# Patient Record
Sex: Female | Born: 1985 | Race: White | Hispanic: No | Smoking: Never smoker
Health system: Southern US, Community
[De-identification: ages and names within clinical notes are randomized; demographics above are authoritative.]

## PROBLEM LIST (undated history)

## (undated) DIAGNOSIS — Z982 Presence of cerebrospinal fluid drainage device: Secondary | ICD-10-CM

## (undated) HISTORY — PX: VENTRICULOPERITONEAL SHUNT: SHX204

## (undated) LAB — TYPE AND ANTIBODY SCREEN (NO BAND): Ext Antibodies: POSITIVE

## (undated) LAB — RPR - FOR TREPONEMA AB POSITIVE ONLY: Ext Syphilis: NONREACTIVE

## (undated) LAB — PLATELET COUNT: Ext Platelets: 173

## (undated) LAB — RUBELLA ANTIBODY, IGG: Ext Rubella: NON-IMMUNE/NOT IMMUNE

## (undated) LAB — HIV-1 AND HIV-2 ANTIBODIES: Ext HIV: NEGATIVE

## (undated) LAB — CHLAMYDIA AMPLIFIED DNA PROBE: Ext Chlamydia: NOT DETECTED

## (undated) LAB — HEPATITIS B SURFACE ANTIGEN: Ext Hepatitis B: NEGATIVE

## (undated) LAB — CBC WITHOUT DIFFERENTIAL: Ext Hematocrit: 41 (ref 36–46)

## (undated) LAB — GC DNA AMPLIFIED PROBE: Ext Gonorrhea: NOT DETECTED

## (undated) LAB — ABO/RH

---

## 2013-07-01 ENCOUNTER — Encounter: Attending: MD | Primary: MD

## 2013-07-03 ENCOUNTER — Ambulatory Visit: Admit: 2013-07-03 | Discharge: 2013-07-03 | Attending: MD | Primary: MD

## 2013-07-03 DIAGNOSIS — R300 Dysuria: Secondary | ICD-10-CM

## 2013-07-03 LAB — URINALYSIS WITH MICROSCOPIC, CULTURE IF INDICATED
Bacteria, UA: NONE SEEN [HPF]
Bilirubin, UR: NEGATIVE
Blood, urine: NEGATIVE
Epithelial, Other: 0 [HPF] (ref 0–2)
Glucose, UA: NEGATIVE
Ketones, urine: NEGATIVE
Leukocyte Esterase, urine: NEGATIVE
Nitrite, UA: NEGATIVE
Protein, urine: NEGATIVE
RBC, UA: <1 [HPF] (ref 0–3)
Specific Gravity, urine: 1.012 (ref 1.010–1.025)
Squamous Epithelial, UA: 2 [HPF] (ref 0–4)
Urobilinogen, UA: <2 mg/dl (ref 0.0–2.0)
WBC, UA: <1 [HPF] (ref 0–3)
pH, UA: 8 (ref 5.0–8.0)

## 2013-07-03 NOTE — Progress Notes (Signed)
SUBJECTIVE:  Rachel [name] is a 28 y.o. female with multiple complaints. She is here for IBIS risk modeling after her BRCA testing came back negative.  She has dysuria for the past few days and wants to be checked for UTI. She denies fever/chills/back pain.  She has a long standing history of irregular cycles and anovulation (FSH 5 and LH 8.4, normal glucose and insulin in 2012, normal TSH this past January).  She tried clomid be got sick/hormonal with it and is interesed in discussing options.    Past Medical History   Diagnosis Date   . 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 Dysfunctional gallbladder      Past Surgical History   Procedure Laterality Date   . Tonsillectomy & adenoidectomy; < age 57  10/1997   . Exploratory laparotomy  08/2005, 08/2007,03/2010     x3   . Cholecystectomy  10/07/2009   . Appendectomy  11/05/2011       ROS: Review of Systems  General: negative for chills, dizziness, fatigue, fever and night sweats   Ear/Nose/Throat: negative for bleeding gums, sinus trouble and sore throat  Respiratory: negative for dyspnea on exertion, cough and wheezing  Cardiovascular: negative for chest pain at rest, dizziness/lightheadedness, exertional chest pain or pressure and irregular heart beat  Gastrointestinal: negative for abdominal pain, heartburn, nausea and poor appetite  Genitourinary: + dysuria, frequency,  Negative for hematuria and hesitancy  Gyn: no abnormal bleeding (but her last cycle was very heavy when she took provera to induce it), no pelvic pain or discharge, no breast pain or new/enlarging lumps on self exam  Neuro: negative for syncope, tremor and weakness  Endocrine: negative for cold intolerance and heat intolerance          OBJECTIVE:  Filed Vitals:    07/03/13 1306   BP: 112/70     Body mass index is 33.95 kg/(m^2).     White female in NAD  Psych: well groomed, good eye contact, normal affect and mood    Counseling and review of IBIS model.    ASSESSMENT:  Rachel [name] is a 28  y.o. female with negative BRCA genetic testing but strong FH of early breast and ovarian cancers in each grandmother respectively. She had IBIS modeling done and her 10 year risk of breast cancer is the same as the general population at 0.3% but her lifelong risk is 17.1%.  Discussed we will adjust this risk model as she gets older and/or she has other family members who have breast or ovarian cancer. Her mother is having testing done for BRCA and we discussed that if her mother is +, this will actually lower her own personal risk since she is negative....  She will let me know. 20 minutes spent reviewing her history and the risk model.  Discussed that if her risk stays more than 15-20%, she would qualify for MRI testing rather than mammogram but since her short-term risk is low, she would not need to start testing until age 71 at this point.  If she has any family changes or she notes any lumps, she will need to let me know.    UA/with reflex to UC sent to lab. Will contact her with results    Discussed her irregular cycles and she should start BBT charting ([email or web address]). Day 3 lab repeat with insulin and glucose testing, if she wants SA testing done, I can order it for her husband. May do better with Femara if needed.  All questions answered.      Counseling and review of labs only.  No exam  30 minutes, of which more than half dedicated to counseling related to irregular cycles and BRCA/genetic implications of testing/IBIS risk modeling.

## 2013-07-04 NOTE — Telephone Encounter (Signed)
Pt stated understanding and wanted to know if you had any reason why the pain and pressure would be back. alw

## 2013-07-04 NOTE — Telephone Encounter (Signed)
PT CALLED TO GET THE URINE RESULTS FROM YESTERDAY.  SHE IS AT WORK [phone]

## 2013-07-04 NOTE — Telephone Encounter (Signed)
Please let her know that it was completely negative.  No signs of UTI.

## 2013-07-04 NOTE — Telephone Encounter (Signed)
It may be due to her menses/hormones.  She last took the provera in mid-March so this could be related to her cycle/PCOS....  Since the pain just restarted, I think it would be good to just watch it for awhile.     If she has not had her menses this month, she should consider taking the provera again x 10 days to get a cycle going which may make things better.....    Please ask her if she wants to set up an appointment to review her temperature chart and other concerns in about 4-6 weeks? (it would be good to have a month of time after her menses starts and she does at least 4 weeks of BBT charting)

## 2013-07-07 NOTE — Telephone Encounter (Signed)
Tried to call her at her work number.  No answer.

## 2013-07-07 NOTE — Telephone Encounter (Signed)
Patient is called.  She states she thinks she figured it out and she was drinking too much green tea.  She states that since she has stopped drinking it, her pain has decreased.  She is still having some pain but not like she was.  She had a period on the 11th and is going to start her temperature charts.  She will wait to make an appointment to discuss temp charts.

## 2013-07-08 NOTE — Telephone Encounter (Signed)
Okay 

## 2013-07-24 NOTE — Telephone Encounter (Signed)
Pt. Taking progesterone she is on day 2.  She said Dr. Dan Humphreys was thinking of putting her on a different medication.  Please call. jh

## 2013-07-24 NOTE — Telephone Encounter (Signed)
She would like to try femara.     Fasting labs day 3    F/u appt day 24-28 of cycle

## 2013-07-25 LAB — COMPREHENSIVE METABOLIC PANEL
ALT - Alanine Amino transferase: 15 U/L (ref 5–33)
AST - Aspartate Aminotransferase: 21 U/L (ref 5–32)
Albumin/Globulin Ratio: 1.4 (ref 1.2–2.2)
Albumin: 4.2 g/dl (ref 3.5–4.8)
Alkaline Phosphatase: 73 U/L (ref 40–140)
Anion Gap: 14.3 mmol/L (ref 12–20)
BUN/Creatinine Ratio: 19.4 (ref 12–20)
BUN: 13 mg/dl (ref 8.0–20.0)
Bilirubin, Total: 0.6 mg/dl (ref 0.1–1.7)
CO2 - Carbon Dioxide: 27 mmol/L (ref 22–32)
Calcium: 9 mg/dl (ref 8.6–10.6)
Chloride: 103.3 mmol/L (ref 101.0–111.0)
Creatinine: 0.67 mg/dl (ref 0.60–1.30)
GFR Estimate: >60
Glucose: 84 mg/dl (ref 74–106)
Osmolality Calc: 278.7 mOsm/kg — ABNORMAL LOW (ref 280–300)
Potassium: 4.61 mmol/L (ref 3.50–5.10)
Protein, Total: 7.2 g/dl (ref 6.1–7.9)
Sodium: 140 mmol/L (ref 135.0–145.0)

## 2013-07-25 LAB — LUTEINIZING HORMONE: LH: 15.7 m[IU]/mL

## 2013-07-25 LAB — FOLLICLE STIMULATING HORMONE: FSH: 5.9 m[IU]/mL

## 2013-07-25 LAB — TSH: TSH - Thyroid Stimulating Hormone: 2.48 u[IU]/mL (ref 0.34–5.60)

## 2013-07-25 LAB — INSULIN, TOTAL: Insulin: 8.9 uU/ml (ref 1.9–23.0)

## 2013-07-25 MED ORDER — letrozole (FEMARA) 2.5 mg tablet
2.5 | ORAL_TABLET | Freq: Every day | ORAL | Status: AC
Start: 2013-07-25 — End: 2013-07-29

## 2013-07-25 NOTE — Telephone Encounter (Signed)
Please let her know that her labs still show PCOS changes.  Her insulin level is rising compared to last time she had it done --- there is another medication which can help her to ovulate and help her body process sugars better (aka - help with wt loss possibly).    If she wants to come in to discuss starting Metformin, please make her an appt or we can discuss it at her femara follow up appt which should be around day 24-28 of her cycle.Marland KitchenMarland KitchenMarland Kitchen

## 2013-07-25 NOTE — Telephone Encounter (Signed)
Left VM for patient to return my call

## 2013-07-25 NOTE — Telephone Encounter (Signed)
Left VM for patient to return call

## 2013-07-26 NOTE — Telephone Encounter (Signed)
Pt sent a FB message to my phone since she could not call office back.    Rachel [name] called her back and left message that there is nothing needing immediate attention about her labs and she should go ahead with the femara.      She told her to call back Monday to discuss possible metformin in more detail.

## 2013-07-28 MED ORDER — metFORMIN (GLUCOPHAGE-XR) 500 MG 24 hr tablet
500 | ORAL_TABLET | ORAL | 2.00 refills | 30.00000 days | Status: DC
Start: 2013-07-28 — End: 2013-09-23

## 2013-07-28 NOTE — Telephone Encounter (Signed)
Metformin called in.  Please ask her to start with 1 po daily and after 2 weeks increase to 2 po daily (at same time is fine).      She may have GI upset and/or loose stools with this medication.  She needs to drink adequate water while on Metformin and should not take if dehydrated.    I will talk to her more about it at her upcoming visit.

## 2013-07-28 NOTE — Telephone Encounter (Signed)
Left pt a VM notifying her of Dr. Tilman Neat note. Advised to call if she has any questions.

## 2013-07-28 NOTE — Telephone Encounter (Signed)
Pt. Left a message.  She said that she is ok with being put on medication.  Her pharmacy is the [facility].  If you need to call she is at [phone] now. jh

## 2013-08-01 MED ORDER — ondansetron (ZOFRAN) 4 MG tablet
4 | ORAL_TABLET | Freq: Four times a day (QID) | ORAL | Status: AC | PRN
Start: 2013-08-01 — End: 2013-08-08

## 2013-08-01 NOTE — Telephone Encounter (Signed)
Patient is called back to let her know that an Rx was sent in for zofran.  Instructed to only use if nausea is really bad.  Instructed that she can also take the metformin every other day for a week to try to lessen the nausea.  Informed that Zofran can also make her constipated.  She also has a question about her progesterone.  She has started on the femara and wants to know if she should still use the progesterone.  She is on day 10 of her cycle.

## 2013-08-01 NOTE — Telephone Encounter (Signed)
Patient calls and states that she is nauseous from starting Metformin.  She states she didn't have nausea last time but does this time.  She states she had a zofran left over and took 1/2 of a 4mg  sl tab that helped.  She was wondering if she could get an rx for zofran until she gets used to medication.  She is taking it mid day and still on the 1 tab a day.  Will check with Dr. Dan Humphreys and will call her back.  To call back at [phone] and can leave a message.  Rx can be sent to [facility].

## 2013-08-01 NOTE — Telephone Encounter (Signed)
Left message that Dr. Dan Humphreys says she does not have to take the progesterone.  Also informed of appointment date and time for femara check.  Instructed to call if she has any questions.

## 2013-08-08 MED ORDER — HYDROcodone-acetaminophen (NORCO) 5-325 mg per tablet
5-325 | ORAL_TABLET | Freq: Three times a day (TID) | ORAL | 0.00 refills | 30.00000 days | Status: DC | PRN
Start: 2013-08-08 — End: 2013-11-20

## 2013-08-08 MED ORDER — ibuprofen (ADVIL,MOTRIN) 600 MG tablet
600 | ORAL_TABLET | Freq: Four times a day (QID) | ORAL | Status: AC | PRN
Start: 2013-08-08 — End: 2013-08-18

## 2013-08-08 NOTE — Telephone Encounter (Signed)
Patient calls and states she is having severe abdominal pain.  She states she started her Femara this month and she is on day 16 of her cycle.  She states that the pain started on Wednesday night and felt bloated also then but subsided.  Now pain is much worse and is her whole abdomen not just one side.  She feels like her abdomen is also bloated and swollen and feels tender to touch.  She states she took 800 mg of ibuprofen 2 hours ago but hasn't helped at all.  She has no bleeding and no fever at this time.  Will discuss with Dr. Dan Humphreys and will call her back.

## 2013-08-08 NOTE — Telephone Encounter (Signed)
Tried to call patient back after talking to Dr. Dan Humphreys. Message left that [place] is scheduled for 2:30 and will need a full bladder.

## 2013-08-08 NOTE — Telephone Encounter (Signed)
Called radiology who stated Rachel [name] was having left sided pain on exam (severe) and there was a 2.1 cm dominant follicle present.  Most likely Rachel [name] either is going to ovulate or just ovulated and the pain is from this.  Blood flow to the ovary was okay and no other issues seen.    This is the result of the femara hopefully working but I am concerned that the treament for Rachel [name] ovulation problem is causing Rachel [name] pain.    Norco script at desk .  No driving when using Norco.

## 2013-08-08 NOTE — Telephone Encounter (Signed)
Discussed with Dr. Dan Humphreys and patient is notified that she has a 2 cm cyst on ovary and everything looks ok.  Dr. Dan Humphreys will write her an rx for pain meds but need to come to office before we close in 30 min to pick it up.  She verbalizes understanding.

## 2013-08-15 MED ORDER — cyclobenzaprine (FLEXERIL) 5 MG tablet
5 | ORAL_TABLET | Freq: Three times a day (TID) | ORAL | 0.00 refills | 30.00000 days | Status: AC | PRN
Start: 2013-08-15 — End: 2013-09-29

## 2013-08-15 NOTE — Telephone Encounter (Signed)
Spoke to patient on the phone she stated that she would like a rx for flexeril and that she never picked up the norco.  The flexeril helps with pain she is having due to it relaxes her. I also spoke to pt about her fmla paper work and that dr walker will not fill it out for intermittent leave.  Patient stated that she did not understand why she would not and will talk to dr walker at her next appt about this.  rx faxed to [facility]. alw

## 2013-08-20 ENCOUNTER — Ambulatory Visit: Admit: 2013-08-20 | Discharge: 2013-08-20 | Attending: MD | Primary: MD

## 2013-08-20 DIAGNOSIS — E282 Polycystic ovarian syndrome: Secondary | ICD-10-CM

## 2013-08-20 LAB — BETA-HCG, SERUM, QUALITATIVE: hCG Qualitative (Serum): NEGATIVE m[IU]/mL

## 2013-08-20 LAB — PROGESTERONE: Progesterone Result: 1.7 ng/mL

## 2013-08-20 NOTE — Progress Notes (Signed)
S:  Rachel [name] is a 28 y.o. female [phone] who has a history of PCOS/insulin resistance.  She has been on femara 2.5 mg for 1 cycles  :LMP 07/23/2013 She is currently day 29 of her cycle and is here for a follow-up check.    She had pain starting last Wed June 2 and severe pain which took her out of work (left side, sharp, better now).  US done that day with small cyst on left ovary (where pain was) -- consistent with follicular cyst, possible ovulation?  She has not been good about doing BBT charting and she did OPK which were negative on June 2 and June 5 (so she thinks she ovulated on the 3rd or 4th).  She had intercourse on June 2.      Only taking 1 tablet of metformin.  She reports constipation (not diarrhea)  Agreeable to increasing metformin    Past Medical History   Diagnosis Date   . 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 Dysfunctional gallbladder      Past Surgical History   Procedure Laterality Date   . Tonsillectomy & adenoidectomy; < age 82  10/1997   . Exploratory laparotomy  08/2005, 08/2007,03/2010     x3   . Cholecystectomy  10/07/2009   . Appendectomy  11/05/2011       Review of Systems  General: negative for chills, dizziness, fatigue, fever and night sweats   Gastrointestinal: +for abdominal pain, heartburn, nausea and poor appetite, + constipation  Genitourinary: negative for dysuria, frequency, hematuria and hesitancy  Gyn: no abnormal bleeding,  + pelvic pain, no discharge, + breast pain, no new/enlarging lumps on self exam    O:  Filed Vitals:    08/20/13 1139   BP: 117/74   Pulse: 69   Temp: 36.9 ?C (98.4 ?F)     WF in NAD  Abd: soft, NT , no masses  Pelvic exam:  Tender on left adnexal region, no adnexal masses    BBT chart not done this month    A/P: Ovulation check     Doing well.    Ovulation: ? Day 15 or 16  Currently day 13 or 14 after possible ovulation.  Plan:UPT negative today.  Will get blood preg test and progesterone level. Discussed importance of BBT when we don't know if or  when she is ovulating.  Discussed [employer] leave will be given to her when she needs it but I don't give extended leave for an intermittent problem (especially if she is not truly being treated for the problem).  If missing a lot of work,she should consider other options (like REI) for a quicker pregnancy.  She wants to continue with femara (few side effects while on medication compared to when she was on clomid.  Increase metformin to 1000 mg per day this month  Plan for medications based on results of labs and when/if she gets a menses or + HCG.  All questions answered.

## 2013-08-20 NOTE — Telephone Encounter (Signed)
Per Dr. Dan Humphreys, pregnancy blood test came back negative. Patient notified by phone.

## 2013-08-20 NOTE — Telephone Encounter (Addendum)
Please let her know that her HCG was negative but I will call her back when I get her progesterone level back

## 2013-08-22 MED ORDER — letrozole (FEMARA) 2.5 mg tablet
2.5 | ORAL_TABLET | Freq: Every day | ORAL | 3.00 refills | 30.00000 days | Status: DC
Start: 2013-08-22 — End: 2013-09-15

## 2013-08-22 NOTE — Telephone Encounter (Signed)
Patient calls and states she started her period yesterday so would like the Femara called to [facility] pharmacy.  She is also informed of Dr. Sonny Dandy note.

## 2013-08-22 NOTE — Progress Notes (Signed)
Script sent  

## 2013-08-22 NOTE — Telephone Encounter (Signed)
Please let her know that her progesterone level came back low at this point.  If she has not started a menses yet, it looks like she started to produce an egg this month (thus the pain) but she did not ovulate the egg.    If she got a cycle, the progesterone level does not mean anything since progesterone falls prior to a cycle and in this case, I would suspect that she actually DID ovulate.    If she is on her menses, I will refill the femara for the current dose.  If she is not on her menses, I want her to take provera again I will double the femara for her next cycle.    Let me know what is happening.Marland KitchenMarland KitchenMarland Kitchen

## 2013-09-15 MED ORDER — letrozole (FEMARA) 2.5 mg tablet
2.5 | ORAL_TABLET | Freq: Every day | ORAL | 3.00 refills | 30.00000 days | Status: DC
Start: 2013-09-15 — End: 2013-09-23

## 2013-09-15 NOTE — Telephone Encounter (Signed)
Please make her appt approx day 24-26

## 2013-09-15 NOTE — Telephone Encounter (Signed)
Message left to call office

## 2013-09-15 NOTE — Telephone Encounter (Signed)
Patient returns call.  She states she is due to start period soon.  Will let us know if she doesn't start.  Femara, same dose refill sent to [facility].  Patient states she thinks she has a yeast infection.  I'm splitting down there and have itching and burning.  She has been on antibiotics for a tooth infection.  Instructed to try OTC yeast cream first.  If no better to call office.

## 2013-09-15 NOTE — Telephone Encounter (Signed)
Ok

## 2013-09-15 NOTE — Telephone Encounter (Signed)
Pt. Called to see if she was to have a f/u appt.  She needs a prescription for Premera and she thinks she has a yeast infection. She is at work today so call and leave a message for her. jh

## 2013-09-22 NOTE — Telephone Encounter (Signed)
Per Dr. Dan Humphreys, I called patient and instructed her to start the Provera today x 10 days and to call us on her first day of her menstrual cycle and let us know if she would like to do the HSG test. Patient did not understand with the HSG test is so I explained to patient. She was under the impression she already had the test done. Will forward this message to Dr. Dan Humphreys to discuss further with patient over the phone.

## 2013-09-22 NOTE — Telephone Encounter (Signed)
Yes, it looks like she did not ovulate last month because her progesterone was low (as per my prior message).  Please ask her to take provera 10 mg daily x 10 days to cycle now.  She can take a UPT first if she feels needed...    If she wants to have the HSG done, she will need to get a urine GC/CHL test done (order) and she will need to wait for her next menses to start.  (Radiology will only do it after a menses prior to possible ovulation).    She will need to call us with her menses starting so we can get the test ordered appropriately.    She will need to take doxycycline 100 mg BID starting the day prior to the procedure for 3 days (TAKE WITH FOOD) and should take some ibuprofen prior to the procedure.    We will check on auth for procedure.    How much metformin is she taking now - she should be slowly working up to 3 tablets per day...    We can double her femara dose the month following her HSG.    (Otherwise, she can cycle with provera now and we can double the femara).

## 2013-09-22 NOTE — Telephone Encounter (Signed)
Patient is called to let her know to call when her cycle starts so we can schedule her to check ovaries on day 24-26.  She states that she hasn't started yet but she wants to talk to doctor Dan Humphreys about maybe taking a month off and get my tubes cleaned out and make sure they are working before taking more of the Femara.  She states she didn't think she ovulated last time because she did ovulation kits with last cycle and none of them where positive for ovulation.  So she wants to make sure her tubes are clear.  Will let Dr. Dan Humphreys know and will call back.

## 2013-09-22 NOTE — Telephone Encounter (Signed)
I called patient and this is the information she gave me:    Femara started on June 11, which was the first day of her menstrual cycle and took the Femara for 5 days  Took Provera for 1-2 days in June but d/c because she said that Dr. Dan Humphreys instructed her to. Has not taken Provera at all this month (July).  Has not had a cycle this month yet.  Had a tooth pulled about 2 weeks ago and was placed on some antibiotics, not sure if the antibiotics is to blame but states she started to experience her skin in her vaginal area splitting each time she wiped and described it as painful. Decided to not finish the antibiotics.  She is up to 2 tablets a day with her Metformin (500mg  each tab).

## 2013-09-23 MED ORDER — letrozole (FEMARA) 2.5 mg tablet
2.5 | ORAL_TABLET | ORAL | 3.00 refills | 30.00000 days | Status: DC
Start: 2013-09-23 — End: 2013-10-31

## 2013-09-23 MED ORDER — metFORMIN (GLUCOPHAGE-XR) 500 MG 24 hr tablet
500 | ORAL_TABLET | ORAL | 2.00 refills | 30.00000 days | Status: DC
Start: 2013-09-23 — End: 2013-12-30

## 2013-09-23 NOTE — Addendum Note (Signed)
Addended by: Ernesto Rutherford on: 09/23/2013 01:12 PM     Modules accepted: Orders

## 2013-09-23 NOTE — Telephone Encounter (Signed)
Patient would also like a refill on her metformin. Rx has been ordered, just needs Dr. Tilman Neat approval.

## 2013-09-23 NOTE — Telephone Encounter (Signed)
Refilled her metformin for up to 3 tabs per day.  She reports vulvar irritation - discussed perineal care, not yeast (she tried topical antifungal and it is worse, will let  Me know if not improved.)    Discussed HSG again with her and costs.  She would like to try femara 5 mg per day.  All questions answered.        She will call with her next menses.

## 2013-10-29 NOTE — Telephone Encounter (Signed)
Patient called stating that she started her cycle today and that she did well on the 5 mg of Femara and did not have pain.  Patient wanted to now what the next step is. alw

## 2013-10-31 MED ORDER — letrozole (FEMARA) 2.5 mg tablet
2.5 | ORAL_TABLET | ORAL | 3.00 refills | 30.00000 days | Status: DC
Start: 2013-10-31 — End: 2013-11-22

## 2013-10-31 NOTE — Telephone Encounter (Signed)
Please let her know that I need to see her in about 21 days to check to make sure her ovaries are okay on the medication (no large cysts).  Please make her a follow up appt.    Also,; I need to make sure this is the correct dose.  Can she fax or bring her BBT chart for me to review TODAY!!    Or if she can tell us when her LMP was and what day she believes that she ovulated on, I can figure it out from there but I still want a copy of her basal body temperature chart

## 2013-10-31 NOTE — Telephone Encounter (Signed)
VM left that I need the info below for me to see if she ovulated appropriately    I will call in her medication since she needs it (day 5 Sunday).    Asked her to call back -needs appt in September to check her ovaries for cysts related to the medication.      Appt arranged for her for 8:40 on September 10th at 11:20

## 2013-11-03 NOTE — Telephone Encounter (Signed)
I spoke to Rachel [name] and notified her of note below  And patient stated understaniding and she started her medication today.  I also had patient set up appt for sept 10 @8 :40am for her f/u. alw

## 2013-11-18 ENCOUNTER — Encounter: Attending: MD | Primary: MD

## 2013-11-20 ENCOUNTER — Ambulatory Visit: Admit: 2013-11-20 | Discharge: 2013-11-20 | Attending: MD | Primary: MD

## 2013-11-20 ENCOUNTER — Ambulatory Visit: Primary: MD

## 2013-11-20 ENCOUNTER — Other Ambulatory Visit: Admit: 2013-11-21 | Primary: MD

## 2013-11-20 ENCOUNTER — Encounter: Attending: MD | Primary: MD

## 2013-11-20 DIAGNOSIS — R350 Frequency of micturition: Secondary | ICD-10-CM

## 2013-11-20 DIAGNOSIS — E282 Polycystic ovarian syndrome: Secondary | ICD-10-CM

## 2013-11-20 LAB — AMB SLM POCT URINE
Bilirubin (Urine): NEGATIVE
Blood (Urine): NEGATIVE
Glucose (Urine): NEGATIVE
Ketones (Urine): NEGATIVE mg/dL
Leukocytes (Urine): NEGATIVE
Nitrites (Urine): NEGATIVE
Protein (Urine): NEGATIVE
Specific Gravity (Urine): 1.01 (ref <=1.005)
Urobilinogen: NEGATIVE
pH (Urine): 5

## 2013-11-20 LAB — PROGESTERONE

## 2013-11-20 LAB — URINE CULTURE

## 2013-11-20 NOTE — Progress Notes (Signed)
S:  Rachel [name] is a 28 y.o. female [phone] who has a history of PCOS.  She was on metformin in the past but could not tolerate it.  She has been on femara for 2 cycles and her current dose is 5 mg daily  She is currently day 21of her cycle and is here for a follow-up check.      She reports less moodiness but she reports more cramping this cycle.    O:  Filed Vitals:    11/20/13 0839   BP: 113/69   Pulse: 58   Temp: 37.3 ?C (99.1 ?F)     10/29/2013  WF in NAD  Abd: soft, NT , no masses  Pelvic exam:  NT, no adnexal masses    BBT chart - not done    A/P: Ovulation check     Doing well.    Ovulation: ?? - not tesing  Currently day?? 21 of menses    Discussed importance of knowing when she ovulates (or if she does) when making dosing adjustments.  Will check progesterone level now to see if this dose worked and asked her to perform OPK  If needed.      Will contact her with progesterone level.    All questions answered.

## 2013-11-22 MED ORDER — letrozole (FEMARA) 2.5 mg tablet
2.5 | ORAL_TABLET | ORAL | Status: DC
Start: 2013-11-22 — End: 2013-11-28

## 2013-11-22 NOTE — Telephone Encounter (Addendum)
Please let her know that she DID ovulate. Her progesterone level was 28.    I am going to refill her medication for the same dose.  I want her to call if she gets a positive pregnancy test this week.  I want her to call us when she gets her menses so we can schedule a follow up visit for her around day 24-28 of her menses.    Please ask her to use ovulation predictor kits this month to try to figure out when she ovulates.    Also let her know that the urine test which was done was negative for blood or infection.    Thanks!

## 2013-11-24 NOTE — Telephone Encounter (Signed)
Message left to call the office

## 2013-11-24 NOTE — Telephone Encounter (Signed)
Patient returns call and notified that she did ovulate and that her progesterone level was 28.  Informed that Dr. Dan Humphreys is going to refill her medicine for the same dose.  She should take a pregnancy test if period does not start.  If period does start, to call so we can schedule and appointment for day 24-28.  Informed that Dr. Dan Humphreys would like her to use the ovulation predictor kits to help figure out when she ovulates.  She verbalizes understanding.    She is informed that her urine was negative for blood or infection.    She states she will let us know what urologist she would like to go to.

## 2013-11-28 MED ORDER — letrozole (FEMARA) 2.5 mg tablet
2.5 | ORAL_TABLET | ORAL | 3.00 refills | 30.00000 days | Status: DC
Start: 2013-11-28 — End: 2014-02-11

## 2013-11-28 NOTE — Telephone Encounter (Signed)
Patient called in today stating she started her cycle today and need a refill of her famara and that she need appt to come back in   24-28 day from today. I filled rx and notified patient that Dorann Lodge will get her appt on Monday. ALW

## 2013-12-01 NOTE — Telephone Encounter (Signed)
I called patient to schedule her for a follow up.  She stated that she has been having chest pains and is not sure if it is the medicine.  I spoke to the nurse and the patient needs to go to the ER or her primary care.  We cannot help with that and they can rule out medications or other causes.  I asked her to call me after she has been seen and we can then schedule the appointment. jh

## 2013-12-01 NOTE — Telephone Encounter (Signed)
Agree 

## 2013-12-05 NOTE — Telephone Encounter (Signed)
Left message for the patient to return call.  She was to call back regarding the last message.  Need to schedule her appointment. jh

## 2013-12-08 NOTE — Telephone Encounter (Signed)
Patient returns call to make appointment for follow up of her Femara.  She also states she called in last week with chest pain and was told to go to there ER.  She states she did not go but she states she talked to Dr. Marcene Brawn and he suggested she have a stress test to make sure it wasn't her medication.  Explained that would have to go through her PCP.  She verbalizes understanding.

## 2013-12-09 NOTE — Telephone Encounter (Signed)
I called and left a message on her phone that made apt for Monday 12/15/13 @ 3:50pm  And for her to call and let us know if this apt will work for her ,   And if the chest pains get bad to come to [facility] clinic or ER  lm

## 2013-12-09 NOTE — Telephone Encounter (Signed)
Rachel [name] called and stated that she has been having chest pains that are not severe enough to go to ER. Pt states that she spoke to Dr. Roselyn Reef at the hospital who told her that she may need to have a stress test done as pt is on meds that mess with her heart. Pt states that she has been having these chest pains for a month to a month and a half, and they are getting worse. Pt states that it is okay to leave voice message as she is working. Please call to discuss.

## 2013-12-15 ENCOUNTER — Encounter: Attending: MD | Primary: MD

## 2013-12-23 MED ORDER — modafinil (PROVIGIL) 200 MG tablet
200 | ORAL_TABLET | Freq: Every day | ORAL | 1.00 refills | Status: DC
Start: 2013-12-23 — End: 2014-02-04

## 2013-12-23 NOTE — Telephone Encounter (Signed)
Rachel [name] called and stated that her insurance needs preauthorization for her Provigil. Pt states she would like her medication as soon as possible.

## 2013-12-23 NOTE — Telephone Encounter (Signed)
Rachel [name] calls saying she needs to speak to you about her daughter,Rachel [name],please see that task. She needs to cancel her appt with Dr.Barbee on 12/24/13 also.

## 2013-12-24 ENCOUNTER — Encounter: Attending: MD | Primary: MD

## 2013-12-29 ENCOUNTER — Other Ambulatory Visit: Admit: 2013-12-29 | Primary: MD

## 2013-12-29 ENCOUNTER — Ambulatory Visit: Admit: 2013-12-29 | Discharge: 2013-12-29 | Attending: MD | Primary: MD

## 2013-12-29 DIAGNOSIS — N926 Irregular menstruation, unspecified: Secondary | ICD-10-CM

## 2013-12-29 LAB — BETA-HCG, SERUM, QUALITATIVE: hCG Qualitative (Serum): NEGATIVE

## 2013-12-29 LAB — PROGESTERONE

## 2013-12-29 NOTE — Progress Notes (Signed)
S:  Rachel [name] is a 28 y.o. female [phone] who has a history of PCOS and anovualtion. She has a history of being sensitive to medications which alter her hormones and she has a history of chest pain with anxiety in the past..  She has been on femara for 2 cycles and her current dose is 5 mg.  She is currently day 32 of her cycle and is here for a follow-up check.    Patient's last menstrual period was 11/28/2013.Marland Kitchen She is not performing BBT charts but has been taking OPK which was + 9 days later (12/07/2013) but no + UPT at home and she is now 22 days past that day.    Chest pain similar to her prior anxiety attacks, pain in RLQ > LLQ (more now that she is working nights), pain in her muscles and joints.  She reports that it occurs throughout the month.  Chest pain note starting after taking the medication but not during the time she was taking it. She also reports continued lower abdominal pains and a pulling sensations.  No change in her bowel or bladder.    O:  Filed Vitals:    12/29/13 1400   BP: 109/73   Pulse: 83   Temp: 36.9 ?C (98.4 ?F)       WF in NAD  Abd: soft, NT , no masses  Pelvic exam:  NT, no adnexal masses        A/P: Ovulation check     Complaints per above- discussed that she does not need to continue with femara if she feels the symptoms are related to it.  I feel that it is unlikely the chest pain is related due to the timing and her history of anxiety induced similar symptoms in the past.  The abdominal cramping is also a long-term problem which may be influenced by the medication.  It is up to her if she wants to continue but there is not much I can do about her abdominal discomfort while she is taking ovulation medicaitons.    Recommend we take a month off to see how she feels off the medication and she can decide if she wants to continue or not.    Ovulation: ??? But she did not ovulate when + OPK since not pregnant and > 14 days after the test was +    Progesterone level and HCG  ordered.    All questions answered.      20 minutes spent with her discussing her symptoms, how the month went with ovulation testing, etc and discussing care plan. All questions answered.

## 2013-12-29 NOTE — Progress Notes (Signed)
Patient stated that she is having more pain with the new med dosage change and she does think that  that she ovulate sept 27. Patient had a positive test for ovulation.   ALW

## 2013-12-30 NOTE — Telephone Encounter (Signed)
Patient calls and states she had lab work drawn yesterday and would like to know the results of the HCG.  Informed that it is negative.  She wants to know how long the progesterone level will take.  Explained the send out tests can take a few days to several days.  She verbalizes understanding.

## 2013-12-31 NOTE — Telephone Encounter (Signed)
Jalanda calls to let you know that the Rx for modafinil (PROVIGIL) 200 MG tablet was authorized. No reason to call back.

## 2013-12-31 NOTE — Telephone Encounter (Signed)
Please let her know that her progesterone level is low so it does not look like she ovulated.      She can either wait for a cycle to start on it's own of she can take provera x 10 days to induce her menses.

## 2014-01-01 NOTE — Telephone Encounter (Signed)
Patient stated that she stated her cylce 12/31/13 and she stated that she is not taking the fertility medication this month.  I advised per her lab results and patient stated understanding.      Patient would like to know what the Dr. Earl Lagos  is in [place] for the fertility shot.  She would like to do a consultion with them.

## 2014-01-02 NOTE — Telephone Encounter (Signed)
Okay - since her menses DID start on it's own, it is possible that the progesterone level was falling getting ready for a cycle OR it is possible she did not ovulate.    Hard to say since her menses started around the time of the law draw.    Please give her Dr. Magnus Ivan information -Dannielle Burn, the [facility] [place].  [facility]).      If she makes an appointment, she just needs to let us know so we can send him all the information we have to his office.    Please let her know that most likely he will want to use injectable medications and these may have more side effects... But they will work better for the most part.

## 2014-01-02 NOTE — Telephone Encounter (Signed)
Patient is notified of Dr. Tilman Neat note.  She is given Dr. Riley Lam Austin's information for [facility].  She will call to make an appointment and will let us know when she has one so we can send her information.  She states i've been trying these meds for 7 months and nothing is working.

## 2014-02-04 MED ORDER — modafinil (PROVIGIL) 200 MG tablet
200 | ORAL_TABLET | Freq: Every day | ORAL | 1.00 refills | Status: DC
Start: 2014-02-04 — End: 2014-03-04

## 2014-02-09 LAB — LIPID PANEL
Chol/HDL Ratio: 3.7 (ref 0.0–5.0)
Cholesterol, HDL: 38 mg/dL — ABNORMAL LOW (ref >=60)
Cholesterol: 142 mg/dL (ref <=200)
LDL Calculated: 85 mg/dL (ref 0–129)
Triglyceride: 97 mg/dL (ref <=150.0)
VLDL Cholesterol Calculation: 19.4 mg/dL (ref 7.0–32.0)

## 2014-02-09 LAB — GLYCO-HEMOGLOBIN A1C: Glycohemoglobin (A1c): 5.1 %

## 2014-02-11 MED ORDER — doxycycline (VIBRA-TABS) 100 MG tablet
100 | ORAL_TABLET | Freq: Two times a day (BID) | ORAL | 0.00 refills | 10.00000 days | Status: DC
Start: 2014-02-11 — End: 2014-05-01

## 2014-02-22 NOTE — Telephone Encounter (Signed)
Pt came to office wanting HSG arranged - doxycycline prior to the procedure starting the day before.    Serum HCG ordered.    Reviewed the procedure with her.  She is going to go see Dr. Eliberto Ivory in [place] for fertility evaluation (unable to tolerate clomid or femara)    SA ordered for her husband

## 2014-02-27 NOTE — Telephone Encounter (Signed)
She is supposed to have her HSG this coming Wednesday but her menses is late - she may start the provera after a negative UPT at home or may wait a few more days.  She has her Doxycyline and her blood pregnancy test for the procedure.    All questions answered.

## 2014-03-03 ENCOUNTER — Other Ambulatory Visit: Admit: 2014-03-03 | Primary: MD

## 2014-03-03 DIAGNOSIS — Z3141 Encounter for fertility testing: Secondary | ICD-10-CM

## 2014-03-03 LAB — BETA-HCG, SERUM, QUALITATIVE: hCG Qualitative (Serum): NEGATIVE

## 2014-03-04 ENCOUNTER — Inpatient Hospital Stay: Admit: 2014-03-04 | Discharge: 2014-05-06 | Attending: MD | Primary: MD

## 2014-03-04 DIAGNOSIS — Z3141 Encounter for fertility testing: Secondary | ICD-10-CM

## 2014-03-04 MED ORDER — modafinil (PROVIGIL) 200 MG tablet
200 | ORAL_TABLET | Freq: Every day | ORAL | 1.00 refills | Status: DC
Start: 2014-03-04 — End: 2015-06-04

## 2014-03-04 MED ORDER — iopamidol (ISOVUE-300) 61 % injection 9 mL
300 | Freq: Once | INTRAVENOUS | Status: AC
Start: 2014-03-04 — End: 2014-03-04
  Administered 2014-03-04: 17:00:00 300 mL via INTRADUCTAL

## 2014-03-10 NOTE — Telephone Encounter (Signed)
I called her back about her HSG testing. VM left for her to call back.    PLEASE READ THIS MESSAGE TO HER IF SHE CALLS BACK:     Her tubes are both open but there appears that the radiologists needed to push the fluid through and there was an area at the ends of both tubes where there may be some scar tissue inside the end of the tube.     I personally looked at the study and there is bilateral fill and spill of dye so her tubes are definatly open.  Also, she has had 2 laparoscopies specifically looking at her tubes On these laparoscopies, her tubes were noted to look normal without any signs of problems.    Now, it is possible that since her last laparoscopy in 2013, that there is some scar now on the very ends of her tubes.  Regardless, this does not matter since she is seeing a Reproductive Endocrinology specialist to discuss possible IVF since she does not tolerate femara or clomid well.    I would like her to  have a copy of the films made and put on a disc for her to take to her appt with Reproductive Endocrinology.      Have we sent a copy of notes there yet-- please do if she has made an appt. -- pap, laparoscopies from 2010- present, all of my notes since 2013., relevant labs during that time, HSG results, any [place] results.    She should take her copy of the [place] photos from her laparoscopy in 2013 to her appointment since they show what her tubes looked like then.  I

## 2014-03-10 NOTE — Telephone Encounter (Signed)
Patient had a procedure last week and she is wondering what the Dr. thought about the results.  Please call. jh

## 2014-03-11 NOTE — Telephone Encounter (Signed)
Patient calls back and Dr. Sonny Dandy note is read to her.  She verbalizes understanding.  She states she has an appointment with the Reproductive endocrinologist in February.  Informed that she should take copies of her [place] images and images from her 2013 laparoscope as well as her chart notes.  Instructed to call us about a week prior to her appointment so we can get all of those copies made.  She verbalizes understanding.

## 2014-03-17 NOTE — Telephone Encounter (Signed)
Message left with radiology to make disc with images from hysterosalginogram and pelvic US.  Patient will pick up disc.

## 2014-03-17 NOTE — Telephone Encounter (Signed)
Radiology calls back and will make disk and understands that patient will pick up.  Not to put out for courier.

## 2014-03-17 NOTE — Telephone Encounter (Signed)
Patient is called back and informed that Dr. Dan Humphreys states that there is nothing more she can do for her tubes.  Her best option is to go to her consult appointment at the [facility] because they have the best information about what can be done.  She states she called them today and they told her they are willing to review her records before she makes the trip up there if there is something they can do.  She is worried that she will make the trip and have to pay the $600 for the visit and not be able to do anything.  She would like to come pick up her records on Friday and will send them herself.  We will call radiology so they can make a disk of her images to send.  Instructed patient to go to medical records at the hospital to see if she can get copies of her laparoscope from a few years ago.  If she cannot get them there, she may have to go to [facility] to sign a release to get copies of the photos.  Patient verbalizes understanding.

## 2014-03-17 NOTE — Telephone Encounter (Signed)
Patient would like to speak with the Dr. Before she has her appointment at the [facility] on February 12th.  She is wondering if there are any other options for her, such as unblocking her tubes.  She said that she cannot afford IVF.  Please call. jh

## 2014-05-04 ENCOUNTER — Ambulatory Visit: Admit: 2014-05-04 | Discharge: 2014-05-04 | Primary: MD

## 2014-05-04 DIAGNOSIS — Z01812 Encounter for preprocedural laboratory examination: Secondary | ICD-10-CM

## 2014-05-04 LAB — CBC WITHOUT DIFFERENTIAL
HCT: 43.3 % (ref 35.0–48.0)
Hemoglobin: 14.2 g/dL (ref 11.7–16.5)
MCH: 29.5 pg (ref 28.3–33.3)
MCHC: 32.8 g/dL (ref 32.5–36.0)
MCV: 90.2 fL (ref 81.0–100.0)
MPV: 8.1 fL (ref 6.9–10.0)
Platelet Count: 198 10*3/ÂµL (ref 150–405)
RBC: 4.8 10*6/ÂµL (ref 3.80–5.60)
RDW: 12.2 % (ref 11.7–16.1)
WBC: 10.3 10*3/ÂµL (ref 4.50–11.00)

## 2014-05-04 LAB — BETA-HCG, SERUM, QUALITATIVE: hCG Qualitative (Serum): NEGATIVE

## 2014-05-04 MED ORDER — doxycycline (VIBRA-TABS) tablet 100 mg
100 | ORAL | Status: DC | PRN
Start: 2014-05-04 — End: 2014-05-13

## 2014-05-04 NOTE — Discharge Instructions (Signed)
Surgery Date:  05/13/2014                      Arrive at the Surgery Entrance at: 10:30 am    o Your surgeon will contact you with your arrival time the working day prior to your procedure.    Do not eat or drink anything after midnight the night prior to your surgery.  Do not chew gum, tobacco or suck on hard candies.    If you develop a sore throat, fever or other illness please call your doctor.    ? If you are going home the same day as surgery, please arrange for an adult to drive you home and be with you for 24 hours after your surgery.    Before leaving for the hospital:    . Take a soapy shower or bath and brush teeth.  . Please do not wear eye make-up.  Marland Kitchen Please wear NO jewelry and remove all piercings.   . Please wear casual, non-restrictive clothing.  . Do not bring credit cards, cash, medication, jewelry, or other valuables.      What What To Bring With You    ? Please bring your glasses.    Questions?  If you have any additional questions, please call the Pre-Op Clinic at [phone] between the hours of 8am-5pm weekdays.    Dr. Misty Stanley is your anesthesiologist.      Angie RN

## 2014-05-12 NOTE — H&P (Signed)
[facility]  HISTORY & PHYSICAL        Rachel, [name]  29 Y old Female, DOB: 12-22-1985  Account Number: [id number]  [address] [place], [id number]  Home: [phone]  Guarantor: Rachel, [name] Insurance: [employer]  Appointment Facility: [facility]  Linton Rump, MD    Reason for Appointment  1. Pre-Op for dx lps with lysis of adhesions , chromotubation d/t Endometriosis, LLQ pain, Dysmenorrhea    History of Present Illness  Pre-Op:  Age 29. G,P G4, P1 . Surgery dx lps with lysis of adhesions, chromotubation. Date of  Surgery 05/13/2014. History This is an established patient presenting here for her pre-op appointment. History  includes llq pain. Evaluation and workup included appointment on 04/06/2014 discussing previous medical  history, llq pain and troubles with conception. Pelvic exam performed, llq tenderness. Recommend to continue  with Ibuprofen 800mg . Treatment options were discussed with the patient and she has chosen to proceed with  Dx lps with lysis of adhesions, chromotubation. OMAP sterilization/hysterectomy consent signed, where N/A.  lmp 05/01/2014    Current Medications  Taking  ?? Prilosec OTC 20 MG Tablet Delayed Release 1 tablet Once a day  ?? DHEA 25 MG Capsule  ?? Multivitamins Capsule  ?? Provigil 200 MG Tablet 1/4 tablet Once a day prn  ?? Phentermine HCl 37.5 MG Tablet 1 tablet Once a day, Notes: has not taken in the past two wks  ?? Medication List reviewed and reconciled with the patient    Past Medical History  Interstitial cystitis  Depression/ panic attacks  Sab's x 3 in 2010  Heartburn    Surgical History  dx lps x 3 2007, 2010, 2012  cholecystectomy 2011  appendectomy 2013  tonsillectomy and adnoidectomy 1999    Family History  Father: alive, htn  Mother: alive, uterine fibroids-hysterectomy at age 27  Paternal Grand Father: deceased, unknown  Paternal Grand Mother: deceased, diabetes  Maternal Grand Father: deceased  Maternal Grand Mother: alive,  ovarian and uterine dx age 9 and breast cancer dx age 34  1daughter(s) - healthy.  BRCA testing done in 2015 pt tested negative for mutation.    Social History  Tobacco Use:  Smoking/[employer] Use Are you a: nonsmoker.    Gyn History  Menarche: Age of onset of maternal menarche: 46.  Periods : every 30 days.  Last mammogram date none.  Last pap smear date 2015.  Abnormal pap smear Abnormal Pap Smear No.  Sexually Transmitted Diseases (STDs) none.  Birth control none.  Gardasil (HPV Immunization) Has not started Gardasil series, declines.  Sexual activity currently sexually active.  Menstruation: Time between periods: 21 to 32 days apart, Lasts: 2-7days, Character of period: with severe pain.  History of Pelvic Infection: none.    OB History  Gravidity: 4.  Full Term: 1.  Preterm: 0.  Abortions (SAB/EAB/ECT): 3.  Living 1.  Pregnancy # 1: 2008, normal spontaneous vaginal delivery (NSVD), F, Birth Weight: 7#13oz, pupps, no complications.  Pregnancy # 2: spontaneous abortion (SAB).  Pregnancy # 3: spontaneous abortion (SAB).  Pregnancy # 4: spontaneous abortion (SAB).    Allergies  Norco: night terrors: Allergy  Latex Gloves: hives: Allergy  sulfa: hives: Allergy  Morphine Sulfate: migraines: Allergy    Hospitalization/Major Diagnostic Procedure  childbirth 07/16/2006    Review of Systems  Gastrointestinal:  Patient denies change in bowel habits, constipation, diarrhea, blood in stool, nausea, vomiting.  Women Only:  Patient denies abnormal vaginal  bleeding, abnormal vaginal discharge, menorrhagia, breast lumps or  discharge, breast pain, breast tenderness, dyspareunia. Patient complaining of dysmenorrhea, left lower  quadrant .  Genitourinary:  Patient denies burning on urination, difficulty urinating, hematuria, dysuria, urinary retention, urinary  frequency , urinary incontinence-urge, urinary incontinence-stress, urinary urgency.    Vital Signs  Ht-in 5 ft 6 in, Wt-lb 177, BMI 28.57, BP 128/68, Temp 98.3, HR 70,  Oxygen sat % 95.    Examination  GYN:  General healthy female, in no acute distress. Head: normocephalic, atraumatic. Heart: nl sinus rhythm, no  murmur. Lungs: clear to auscultation, no crackles or wheezes. Abdomen: mild SP/BLQ tender; o/w soft,  nondistended, non-tender, no masses, BS (+). External  Genitalia: normal. BUS: normal. Urethra: normal. Bladder: normal. Vagina: normal, no lesions. Vaginal  Vault: normal vaginal discharge, no odor. Cervix: smooth, no lesions. Uterus: moderately tender; o/w normal  mobility, normal size, shape and consistency. Adnexa: moderately tender bilaterally; o/w no masses,  enlargement, or tenderness bilaterally. Anus /Perineum: normal. Pelvic Support Defects: cystocele, (stage I),  uterine prolapse, (stage I). Extremities: no edema, clubbing or cyanosis.    Assessments  1. Endometriosis - N80.9 (Primary)  2. LLQ pain - R10.32  3. Dysmenorrhea - N94.6  4. Pre-op exam - Z01.818    Treatment  1. Pre-op exam  Start Ibuprofen Tablet, 800 MG, 1 tablet, Orally, Three times a day, 10 days, 30 Tablet, Refills 1  Start Percocet Tablet, 5-325 MG, 1-2 tablets, Orally, every 6 hrs as mneeded for pain, 7 days, 30, Refills 0  Notes: Risks of procedure discussed in detail. Those risks include, but are not limited to: bleeding, infection, injury to  surrounding organs, including the bowel, bladder, ureter, nerves, blood vessels and ovaries. Risk of severe anemia requiring  blood transfusion also discussed. PARQ completed. Consent forms signed for the procedure and blood transfusion if  necessary.  Advised nothing to eat or drink after midnight the night before procedure  Discussed surgery in detail, will then perform chromointubation which is injecting dye in the uterus to see what is going on  within the tubes and expained recovery process  will prescribe ibuprofen and percocet for pain management after procedure  strongly advised no intercourse until after surgery  advised that there is no  guarantee that surgery will take away the pain but a good possibility.    2. Others  Notes: plan  dx lps with lysis of adhesions, chromotubation.        Marshell Garfinkel, MD, 05/12/2014 8:25 PM

## 2014-05-13 MED ORDER — methylene blue 1 % (10 mg/mL) injection
1 | INTRAVENOUS | Status: DC | PRN
Start: 2014-05-13 — End: 2014-05-13
  Administered 2014-05-13: 23:00:00 1 % (0 mg/mL)

## 2014-05-13 MED ORDER — sodium chloride (NS) 0.9% irrigation
0.9 | Status: DC | PRN
Start: 2014-05-13 — End: 2014-05-13
  Administered 2014-05-13: 23:00:00 0.9 % irrigation

## 2014-05-13 MED ORDER — famotidine (PEPCID) 20 MG tablet
20 | ORAL | Status: AC
Start: 2014-05-13 — End: ?

## 2014-05-13 MED ORDER — cisatracurium (NIMBEX) injection
2 | INTRAVENOUS | Status: DC | PRN
Start: 2014-05-13 — End: 2014-05-13
  Administered 2014-05-13: 23:00:00 2 mg/mL via INTRAVENOUS

## 2014-05-13 MED ORDER — gabapentin (NEURONTIN) 300 MG capsule
300 | ORAL | Status: AC
Start: 2014-05-13 — End: ?

## 2014-05-13 MED ORDER — ondansetron (ZOFRAN) injection 4 mg
4 | Freq: Four times a day (QID) | INTRAMUSCULAR | Status: DC | PRN
Start: 2014-05-13 — End: 2014-05-13

## 2014-05-13 MED ORDER — labetalol (NORMODYNE, TRANDATE) syringe 5 mg
20 | INTRAVENOUS | Status: DC | PRN
Start: 2014-05-13 — End: 2014-05-13

## 2014-05-13 MED ORDER — ketorolac (TORADOL) injection
30 | INTRAMUSCULAR | Status: DC | PRN
Start: 2014-05-13 — End: 2014-05-13
  Administered 2014-05-13: 23:00:00 30 mg/mL (1 mL) via INTRAVENOUS

## 2014-05-13 MED ORDER — acetaminophen (TYLENOL) 500 MG tablet
500 | ORAL | Status: AC
Start: 2014-05-13 — End: ?

## 2014-05-13 MED ORDER — ondansetron (ZOFRAN) injection
4 | INTRAMUSCULAR | Status: DC | PRN
Start: 2014-05-13 — End: 2014-05-13
  Administered 2014-05-13: 23:00:00 4 mg/2 mL via INTRAVENOUS

## 2014-05-13 MED ORDER — hydrALAZINE (APRESOLINE) injection 10 mg
20 | INTRAMUSCULAR | Status: DC | PRN
Start: 2014-05-13 — End: 2014-05-13

## 2014-05-13 MED ORDER — famotidine (PEPCID) tablet 20 mg
20 | Freq: Once | ORAL | Status: AC
Start: 2014-05-13 — End: 2014-05-13
  Administered 2014-05-13: 21:00:00 20 mg via ORAL

## 2014-05-13 MED ORDER — fentaNYL (PF) (SUBLIMAZE) 50 mcg/mL injection 25-50 mcg
50 | INTRAMUSCULAR | Status: DC | PRN
Start: 2014-05-13 — End: 2014-05-13
  Administered 2014-05-13 – 2014-05-14 (×2): 50 ug via INTRAVENOUS

## 2014-05-13 MED ORDER — midazolam (VERSED) injection 0.5-2 mg
1 | INTRAMUSCULAR | Status: DC | PRN
Start: 2014-05-13 — End: 2014-05-13
  Administered 2014-05-13: 23:00:00 1 mg via INTRAVENOUS

## 2014-05-13 MED ORDER — bupivacaine 0.25%-EPINEPHrine 1:200,000 injection
0.25 | INTRAMUSCULAR | Status: DC | PRN
Start: 2014-05-13 — End: 2014-05-13
  Administered 2014-05-13: 23:00:00 0.25 %-1:200,000 via SUBCUTANEOUS

## 2014-05-13 MED ORDER — diphenhydrAMINE (BENADRYL) injection 12.5-25 mg
50 | INTRAMUSCULAR | Status: DC | PRN
Start: 2014-05-13 — End: 2014-05-13

## 2014-05-13 MED ORDER — fentaNYL (PF) (SUBLIMAZE) 50 mcg/mL injection
50 | INTRAMUSCULAR | Status: DC | PRN
Start: 2014-05-13 — End: 2014-05-13
  Administered 2014-05-13 (×5): 50 mcg/mL via INTRAVENOUS

## 2014-05-13 MED ORDER — ePHEDrine injection 5-10 mg
50 | INTRAMUSCULAR | Status: DC | PRN
Start: 2014-05-13 — End: 2014-05-13

## 2014-05-13 MED ORDER — gabapentin (NEURONTIN) capsule 600 mg
300 | Freq: Once | ORAL | Status: AC
Start: 2014-05-13 — End: 2014-05-13
  Administered 2014-05-13: 21:00:00 300 mg via ORAL

## 2014-05-13 MED ORDER — fentaNYL (PF) (SUBLIMAZE) 50 mcg/mL injection
50 | INTRAMUSCULAR | Status: AC
Start: 2014-05-13 — End: ?

## 2014-05-13 MED ORDER — acetaminophen (TYLENOL) tablet 1,000 mg
500 | Freq: Once | ORAL | Status: AC
Start: 2014-05-13 — End: 2014-05-13
  Administered 2014-05-13: 21:00:00 500 mg via ORAL

## 2014-05-13 MED ORDER — glycopyrrolate (ROBINUL) injection
0.2 | INTRAMUSCULAR | Status: DC | PRN
Start: 2014-05-13 — End: 2014-05-13
  Administered 2014-05-13: 0.2 mg/mL via INTRAVENOUS

## 2014-05-13 MED ORDER — midazolam (VERSED) 1 mg/mL injection
1 | INTRAMUSCULAR | Status: AC
Start: 2014-05-13 — End: ?

## 2014-05-13 MED ORDER — neostigmine (PROSTIGMIN) injection
1 | INTRAMUSCULAR | Status: DC | PRN
Start: 2014-05-13 — End: 2014-05-13
  Administered 2014-05-13: 1 mg/mL via INTRAVENOUS

## 2014-05-13 MED ORDER — propofol (DIPRIVAN) injection
10 | INTRAVENOUS | Status: DC | PRN
Start: 2014-05-13 — End: 2014-05-13
  Administered 2014-05-13: 23:00:00 10 mg/mL via INTRAVENOUS

## 2014-05-13 MED ORDER — ketamine (KETALAR) injection
50 | INTRAVENOUS | Status: DC | PRN
Start: 2014-05-13 — End: 2014-05-13
  Administered 2014-05-13: 23:00:00 50 mg/mL (1 mL) via INTRAVENOUS

## 2014-05-13 MED ORDER — dexamethasone sodium phos (PF) (DECADRON) 10 mg/mL injection
10 | INTRAMUSCULAR | Status: DC | PRN
Start: 2014-05-13 — End: 2014-05-13
  Administered 2014-05-13: 23:00:00 10 mg/mL via INTRAVENOUS

## 2014-05-13 MED ORDER — lidocaine 20 mg/mL (2 %) injection
20 | INTRAMUSCULAR | Status: DC | PRN
Start: 2014-05-13 — End: 2014-05-13
  Administered 2014-05-13: 23:00:00 20 mg/mL (2 %) via INTRAVENOUS

## 2014-05-13 MED ORDER — meperidine (PF) (DEMEROL) syringe 12.5 mg
50 | INTRAMUSCULAR | Status: DC | PRN
Start: 2014-05-13 — End: 2014-05-13

## 2014-05-13 MED ORDER — lactated ringers infusion
INTRAVENOUS | Status: DC
Start: 2014-05-13 — End: 2014-05-13
  Administered 2014-05-13 – 2014-05-14 (×2): via INTRAVENOUS

## 2014-05-13 MED ORDER — famotidine (PF) (PEPCID) 20 mg in 0.9% NaCl IVPB Premix
20 | Freq: Once | INTRAVENOUS | Status: AC
Start: 2014-05-13 — End: 2014-05-13

## 2014-05-13 NOTE — Anesthesia Pre-Procedure Evaluation (Addendum)
Anesthesia Evaluation     Patient summary reviewed and Nursing notes reviewed    No history of anesthetic complications   Airway   Mallampati: I  Dental - normal exam     Pulmonary - normal exam    breath sounds clear to auscultation  (+) asthma,   Cardiovascular - normal exam  (+) dysrhythmias,     Rhythm: regular  Rate: normal    Neuro/Psych - negative ROS     GI/Hepatic/Renal    (+) GERD, obesity    Endo/Other - negative ROS    Musculoskeletal - negative ROS                   Anesthesia Plan    ASA 2     general     intravenous induction   Anesthetic Plan, Alternatives, Risks and Questions discussed with patient.    Pre-Anesthesia Evaluation Completed at:  05/13/2014 1:58 PM

## 2014-05-13 NOTE — OR Nursing (Signed)
Patients ID, surgeon, and procedure confirmed.  Patient up to bathroom prior to coming to OR.  Patient to PACU at end of case, awakening-c/o abdominal pain-accompanied by anes./RF.

## 2014-05-13 NOTE — Op Note (Signed)
[facility]  OPERATIVE NOTE    Rachel [name]  [id number]   DOB:  03-26-1985       Proc. Date  05/13/2014   Preop Dx  N80.9,N94.6, R10.32}   Postop Dx  LLQ pain   Procedure  Diagnostic laparoscopy   Chromotubation   Anesthesia  Dr. Misty Stanley, GETA   Surgeon  Marshell Garfinkel, MD   Assistant  Pimentel   EBL  minimal   Specimens  * No specimens in log *   Complications  None known     FINDINGS  Abnormal:  Adhesions left tube, descending colon    Normal:  Right tube, left tube, right ovary, left ovary, uterus  Appendix, liver, gallbladder    INDICATIONS FOR PROCEDURE  Age 29. G,P G4, P1 . Surgery dx lps with lysis of adhesions, chromotubation. Date of Surgery 05/13/2014. History This is an established patient presenting here for her pre-op appointment. History  includes llq pain. Evaluation and workup included appointment on 04/06/2014 discussing previous medical history, llq pain and troubles with conception. Pelvic exam performed, llq tenderness. Recommend to continue  with Ibuprofen 800mg . Treatment options were discussed with the patient and she has chosen to proceed with Dx lps with lysis of adhesions, chromotubation.     Risks, benefits, and alternatives were discussed with the patient. Those risks include but are not limited to bleeding, infection, damage to internal organs, vessels and nerves. Risk that the procedure may not improve and eliminate pain also discussed. PA RQ completed and consents obtained for the procedure and for blood transfusion if necessary.    PROCEDURE:  The patient was taken to the operating room with IV running. She was put to sleep by the anesthesiologist using general endotracheal anesthesia. She was placed in dorsal lithotomy position.    She was prepped and draped in usual sterile fashion. A Foley catheter was placed. A Rumi uterine manipulator was placed in the usual fashion.     A 5 mm infraumbilical incision was made in the umbilicus after infiltrating with 0.25% Marcaine with  epinephrine and dissection was carried down to the fascia using a Tresa Endo. The Veress needle was placed and placement was confirmed by the easy passage of saline. The abdomen was insufflated to a pressure of 15.    A 5 mm RLQ trocar was placed without difficulty after infiltrating with 0.25% Marcaine with epinephrine and making a 5 mm incision with a scapel. A 5 mm LLQ trocar was placed without difficulty after infiltrating with 0.25% Marcaine with epinephrine and making a 5 mm incision with a scapel. Both trocars were placed under direct visualization.     Survey of the pelvis showed the above findings.     The adhesions involving the left and bowel were carefully taken down using blunt and sharp dissection and minimal cautery. Care was taken to avoid using cautery near bowel and tube.     Dilute Methylene Blue was injected through the Rumi. Flow was seen coming from the right tube and from the left tube.    The pelvis was copiously irrigated and aspirated with saline.    Intercede was placed over the dissected area near the left tube and bowel.    The lower quadrant ports were removed under direct visualization. Hemostasis was assured. The gas was released, and the umbilical port was removed. The skin was closed at all sites in a subcuticular fashion using 4-0 Vicryl. Dressings were applied. The uterine manipulator and the Foley catheter  were removed.    The patient was undraped, cleaned up, taken out of the stirrups, awakened, extubated and transferred to the recovery room in stable condition.    The patient tolerated the procedure well with no known complications    FINAL PROGRESS ASSESMENT  Patient will be discharged to home when:  - all criteria are met per protocol  - she is in stable condition  - she is ambulatory  - she is tolerating PO well  - her pain is well controlled with PO pain medication  - she has voided  Medications: Ibuprofen, Norco  Follow-up: 1 week (as scheduled)  Post-operative diagnosis: As  above    Marshell Garfinkel, MD 05/13/2014 2:19 PM

## 2014-05-13 NOTE — Anesthesia Post-Procedure Evaluation (Signed)
Patient Name: Rachel [name]  Procedures performed: Procedure(s):  LAPAROSCOPIC ENTEROLYSIS AND CHROMOTUBATION    Last Vitals:   Filed Vitals:    05/13/14 1559   BP:    Pulse: 56   Temp:    Resp: 21   SpO2: 100%       Planned Anesthesia Type: general  Final Anesthesia Type: general  Patients Current Location: PACU  Level of Consciousness: awake  Post Procedure Pain:adequate analgesia  Airway: Patent  Respiratory Status: face mask and spontaneous ventilation  Cardio Status: hemodynamically stable  Hydration: adequately hydrated  PONV? NO  Anesthetic Complications? No    The patient was able to participate in the post op evaluation    Comments:      Jearld Adjutant Makenzy Krist  4:00 PM

## 2014-05-13 NOTE — Discharge Instructions (Signed)

## 2014-05-14 MED ORDER — ibuprofen (ADVIL,MOTRIN) 800 MG tablet
800 | ORAL_TABLET | Freq: Three times a day (TID) | ORAL | Status: AC | PRN
Start: 2014-05-14 — End: 2014-06-12

## 2014-05-14 MED ORDER — oxyCODONE-acetaminophen (PERCOCET) 5-325 mg per tablet
5-325 | ORAL_TABLET | Freq: Four times a day (QID) | ORAL | 0.00 refills | 28.00000 days | Status: AC | PRN
Start: 2014-05-14 — End: 2014-05-23

## 2014-05-14 MED ORDER — oxyCODONE-acetaminophen (PERCOCET) 5-325 mg per tablet 1-2 tablet
5-325 | ORAL | Status: DC | PRN
Start: 2014-05-14 — End: 2014-05-13
  Administered 2014-05-14: 01:00:00 5-325 via ORAL

## 2014-05-14 MED ORDER — ondansetron (ZOFRAN-ODT) 8 MG disintegrating tablet
8 | ORAL_TABLET | Freq: Three times a day (TID) | ORAL | Status: AC | PRN
Start: 2014-05-14 — End: 2014-05-20

## 2014-05-14 MED ORDER — oxyCODONE-acetaminophen (PERCOCET) 5-325 mg per tablet
5-325 | ORAL | Status: AC
Start: 2014-05-14 — End: ?

## 2014-05-18 ENCOUNTER — Inpatient Hospital Stay: Admit: 2014-05-18 | Attending: MD | Primary: MD

## 2014-05-18 DIAGNOSIS — M79662 Pain in left lower leg: Secondary | ICD-10-CM

## 2014-08-06 ENCOUNTER — Other Ambulatory Visit: Admit: 2014-08-06 | Primary: MD

## 2014-08-06 DIAGNOSIS — R5383 Other fatigue: Secondary | ICD-10-CM

## 2014-08-06 LAB — T3, FREE: T3, Free: 3.9 pg/mL (ref 2.5–3.9)

## 2014-08-06 LAB — T4, FREE: T4, Free: 1.44 ng/dL (ref 0.58–1.64)

## 2014-08-06 LAB — TSH: TSH - Thyroid Stimulating Hormone: 2.05 u[IU]/mL (ref 0.34–5.60)

## 2014-11-17 ENCOUNTER — Ambulatory Visit: Admit: 2014-11-17 | Discharge: 2014-11-17 | Attending: MD | Primary: MD

## 2014-11-17 ENCOUNTER — Other Ambulatory Visit: Admit: 2014-11-17 | Discharge: 2014-11-17 | Primary: MD

## 2014-11-17 DIAGNOSIS — Z3490 Encounter for supervision of normal pregnancy, unspecified, unspecified trimester: Secondary | ICD-10-CM

## 2014-11-17 DIAGNOSIS — Z331 Pregnant state, incidental: Secondary | ICD-10-CM

## 2014-11-17 LAB — BETA-HCG, URINE, QUALITATIVE: hCG, Qualitative: POSITIVE — AB

## 2014-11-17 NOTE — Progress Notes (Signed)
Confirm pregnancy  llm
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
## 2014-11-17 NOTE — Patient Instructions (Addendum)
Take prenatal vits once daILY AND WILL LET YU KNOW RESULTS OF THE ULTRASOUND WHEN AVAILABLE. CALL FOR ANY SINGIFICANT BLEEDING OR WORSENING  CRAMPS              Ultrasound (Pending)  Office use only:  Date: 11/17/2014  Time: 2:52 PM  User Initials: NLL     Additional Info:   []  Please call to schedule an appointment.  []  We will call you with a scheduled appointment.   [x]  They will call you to schedule an appointment. Please allow up to 2 weeks for them to call and schedule an appointment.   Procedure Instructions:

## 2014-11-17 NOTE — Progress Notes (Signed)
Please see the patient instructions section of the chart.

## 2014-11-19 ENCOUNTER — Inpatient Hospital Stay: Admit: 2014-11-19 | Discharge: 2014-11-23 | Attending: MD | Primary: MD

## 2014-11-19 DIAGNOSIS — Z36 Encounter for antenatal screening of mother: Secondary | ICD-10-CM

## 2014-11-23 NOTE — Telephone Encounter (Signed)
-----   Message from Rubin Payor, MD sent at 11/20/2014  6:10 PM PDT -----  Tell her she is about 5 weeks by ultrasound but needs a repeat US in about 2 weeks to actually visualize the fetus. Please schedule that

## 2014-11-23 NOTE — Telephone Encounter (Signed)
thought i put in the order but will try again

## 2014-11-23 NOTE — Telephone Encounter (Signed)
I talked with Rachel [name] and she is aware and will call and set up the U/S for about 2 weeks per advise from Dr Gerre Couch   lm

## 2014-11-23 NOTE — Telephone Encounter (Signed)
Rachel [name] calls saying she just called [facility] scheduling and they do not see an order for a follow up fetal US.  Rachel [name] would like to have that put into EPIC so she can schedule the [place] on or near 12/07/14. She would like a call when this has been completed so she will know when to call.

## 2014-11-23 NOTE — Telephone Encounter (Signed)
Called Rachel [name] an told her that Dr Gerre Couch had put in the U/S order again   And she will call and try and schedule the fetal ultrasound  lm

## 2014-11-23 NOTE — Addendum Note (Signed)
Addended by: Rubin Payor A. on: 11/23/2014 12:31 PM     Modules accepted: Orders

## 2014-11-23 NOTE — Telephone Encounter (Signed)
Dr Gerre Couch ,   The order for the fetal ultrasound needing to be put in the epic for an order ,   Gaige will schedule the appt when it is in   lm

## 2014-11-25 ENCOUNTER — Other Ambulatory Visit: Admit: 2014-11-25 | Primary: MD

## 2014-11-25 DIAGNOSIS — Z36 Encounter for antenatal screening of mother: Secondary | ICD-10-CM

## 2014-11-25 LAB — OBSOLETE HIV 1,2 AB/AG SCREEN: HIV antigen: NEGATIVE

## 2014-11-25 LAB — CBC WITH AUTO DIFFERENTIAL
Basophils %: 0 % (ref 0–2)
Basophils, Absolute: 0 10*3/??L (ref 0.0–0.1)
Eosinophils %: 0 % (ref 0–5)
Eosinophils, Absolute: 0 10*3/??L (ref 0.0–0.4)
HCT: 41.4 % (ref 35.0–48.0)
Hemoglobin: 14.3 g/dL (ref 11.7–16.5)
Lymphocytes %: 19 % — ABNORMAL LOW (ref 20–40)
Lymphocytes, Absolute: 1.4 10*3/ÂµL (ref 1.0–4.0)
MCH: 30.7 pg (ref 28.3–33.3)
MCHC: 34.6 g/dL (ref 32.5–36.0)
MCV: 88.9 fL (ref 81.0–100.0)
MPV: 7.8 fL (ref 6.9–10.0)
Monocytes %: 6 % (ref 2–12)
Monocytes, Absolute: 0.5 10*3/ÂµL (ref 0.2–1.0)
Neutrophils %: 74 % (ref 50–74)
Neutrophils, Absolute: 5.4 10*3/ÂµL (ref 2.0–7.4)
Platelet Count: 173 10*3/ÂµL (ref 150–405)
RBC: 4.66 10*6/??L (ref 3.80–5.60)
RDW: 12 % (ref 11.7–16.1)
WBC: 7.3 10*3/ÂµL (ref 4.50–11.00)

## 2014-11-25 LAB — RPR WITH REFLEX: VDRL / RPR: NONREACTIVE

## 2014-11-25 LAB — RUBELLA ANTIBODY, IGG: Rubella, IgG: 14 IU/mL — ABNORMAL HIGH (ref <10)

## 2014-11-25 LAB — ABO/RH (HCLL): Rh (D): POSITIVE

## 2014-11-25 LAB — HEPATITIS B SURFACE ANTIGEN: HBs Antigen: NEGATIVE

## 2014-11-25 LAB — ANTIBODY SCREEN (HCLL): Antibody Screen: POSITIVE

## 2014-11-25 LAB — TSH: TSH - Thyroid Stimulating Hormone: 2.12 u[IU]/mL (ref 0.34–5.60)

## 2014-11-25 LAB — ANTIBODY IDENTIFICATION (HCLL)

## 2014-11-25 LAB — HIV 1,2 AB/AG SCREEN: HIV-1, 2 Ab Screen (Rapid): NEGATIVE

## 2014-11-30 ENCOUNTER — Other Ambulatory Visit: Admit: 2014-11-30 | Primary: MD

## 2014-11-30 DIAGNOSIS — Z36 Encounter for antenatal screening of mother: Secondary | ICD-10-CM

## 2014-11-30 LAB — ARC ANTIBODY ID

## 2014-12-03 ENCOUNTER — Inpatient Hospital Stay: Admit: 2014-12-03 | Discharge: 2014-12-07 | Attending: MD | Primary: MD

## 2014-12-03 DIAGNOSIS — Z36 Encounter for antenatal screening of mother: Secondary | ICD-10-CM

## 2014-12-04 NOTE — Telephone Encounter (Signed)
notifed pt of the U/s and she said that she is seeing Dr Laural Benes next week and will be doing another U/S there and can discuss the Ultrasound as well lm

## 2014-12-04 NOTE — Telephone Encounter (Signed)
-----   Message from Rubin Payor, MD sent at 12/03/2014  5:56 PM PDT -----  Tell her currently at [redacted] weeks gestation and no apparaent problems

## 2014-12-18 ENCOUNTER — Other Ambulatory Visit: Admit: 2014-12-18 | Primary: MD

## 2014-12-18 DIAGNOSIS — O361910 Maternal care for other isoimmunization, first trimester, not applicable or unspecified: Secondary | ICD-10-CM

## 2014-12-18 LAB — ARC ANTIBODY ID

## 2014-12-24 ENCOUNTER — Ambulatory Visit: Admit: 2014-12-24 | Discharge: 2014-12-24 | Attending: MD | Primary: MD

## 2014-12-24 DIAGNOSIS — S43401A Unspecified sprain of right shoulder joint, initial encounter: Secondary | ICD-10-CM

## 2014-12-24 MED ORDER — HYDROcodone-acetaminophen (NORCO) 5-325 mg per tablet
5-325 | ORAL_TABLET | Freq: Four times a day (QID) | ORAL | 0 refills | 30.00000 days | Status: AC | PRN
Start: 2014-12-24 — End: 2015-01-03

## 2014-12-24 NOTE — Progress Notes (Signed)
For a couple days her right shoulder has been painful and burning , she has tried several massages and icing it and the pain   Hasn't gotten any better ,  She had twisted around her to get something and then the pain /burning started    llm
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
## 2014-12-24 NOTE — Patient Instructions (Signed)
Ice for 20 min 3-4 times per day and use tylenol for pain and hydrocodone if needed. PT or chiropractic care recommended for topical modalities. Don't work tomorrow if stil in pain. Recheck prn

## 2015-01-05 ENCOUNTER — Encounter: Primary: MD

## 2015-01-07 ENCOUNTER — Ambulatory Visit: Admit: 2015-01-07 | Discharge: 2015-01-07 | Payer: PRIVATE HEALTH INSURANCE | Primary: MD

## 2015-01-07 ENCOUNTER — Inpatient Hospital Stay: Admit: 2015-01-07 | Discharge: 2015-01-07 | Attending: Maternal & Fetal Medicine | Primary: MD

## 2015-01-07 ENCOUNTER — Institutional Professional Consult (permissible substitution): Admit: 2015-01-07 | Discharge: 2015-01-07 | Primary: MD

## 2015-01-07 DIAGNOSIS — Z8742 Personal history of other diseases of the female genital tract: Secondary | ICD-10-CM

## 2015-01-07 DIAGNOSIS — Z641 Problems related to multiparity: Secondary | ICD-10-CM

## 2015-01-07 DIAGNOSIS — Z36 Encounter for antenatal screening of mother: Secondary | ICD-10-CM

## 2015-01-07 NOTE — Progress Notes (Signed)
Chief Complaint   Patient presents with   . Pregnancy Ultrasound     + antibody   . Consultation   . Nuchal       Vitals:    01/07/15 0812   BP: 110/64   BP Location: Right arm   Patient Position: Sitting   Weight: 167 lb (75.8 kg)   Height: 5' 5 (1.651 m)     Body mass index is 27.79 kg/(m^2).    Patient was seen today for an ultrasound in Dr Santo Held schedule. Nuchal finger stick was obtained during visit. Patient tolerated well.      Patient sent to lab with requisition for cytomegalovirus and parvovirus IGG/IGM labs to be drawn.
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
## 2015-01-21 ENCOUNTER — Other Ambulatory Visit: Admit: 2015-01-21 | Primary: MD

## 2015-01-21 DIAGNOSIS — Z578 Occupational exposure to other risk factors: Secondary | ICD-10-CM

## 2015-01-21 LAB — CYTOMEGALOVIRUS ANTIBODY, IGM: Cytomegalovirus Ab, IgM: NEGATIVE

## 2015-01-21 LAB — PARVOVIRUS B19 ANTIBODIES, IGG AND IGM
Parvovirus B19 IgG: 6.7 index — ABNORMAL HIGH (ref <0.90)
Parvovirus B19 IgM: 0.09 index (ref <0.90)

## 2015-01-21 LAB — CYTOMEGALOVIRUS ANTIBODY, IGG: Cytomegalovirus IgG: POSITIVE — AB

## 2015-01-26 LAB — LIPID PANEL
Chol/HDL Ratio: 3.1 (ref 0.0–5.0)
Cholesterol, HDL: 53 mg/dL — ABNORMAL LOW (ref >=60)
Cholesterol: 162 mg/dL (ref <=200)
LDL Calculated: 84 mg/dL (ref 0–129)
Triglyceride: 124 mg/dL (ref <=150.0)
VLDL Cholesterol Calculation: 24.8 mg/dL (ref 7.0–32.0)

## 2015-01-26 LAB — GLYCO-HEMOGLOBIN A1C: Glycohemoglobin (A1c): 4.7 %

## 2015-01-29 NOTE — Telephone Encounter (Signed)
-----   Message from Hildred Priest, MD sent at 01/29/2015 12:19 PM PST -----  01/29/2015 [redacted]w[redacted]d Dear Alvis Lemmings and Jodene Nam, could you please let Ms. Waheed know that she is immune to CMV and ParvoB19?  Thanks!  -Jesusita Oka

## 2015-01-29 NOTE — Telephone Encounter (Signed)
Telephone call to patient, message left on voicemail that per Dr Park Breed she immune to both CMV and Parvo

## 2015-02-01 ENCOUNTER — Other Ambulatory Visit: Admit: 2015-02-01 | Primary: MD

## 2015-02-01 DIAGNOSIS — Z36 Encounter for antenatal screening of mother: Secondary | ICD-10-CM

## 2015-02-01 LAB — CBC WITH AUTO DIFFERENTIAL
Basophils %: 0 % (ref 0–2)
Basophils, Absolute: 0 10*3/ÂµL (ref 0.0–0.1)
Eosinophils %: 0 % (ref 0–5)
Eosinophils, Absolute: 0 10*3/ÂµL (ref 0.0–0.4)
HCT: 38.2 % (ref 35.0–48.0)
Hemoglobin: 13.4 g/dL (ref 11.7–16.5)
Lymphocytes %: 16 % — ABNORMAL LOW (ref 20–40)
Lymphocytes, Absolute: 1.4 10*3/??L (ref 1.0–4.0)
MCH: 30.7 pg (ref 28.3–33.3)
MCHC: 35 g/dL (ref 32.5–36.0)
MCV: 87.8 fL (ref 81.0–100.0)
MPV: 7.6 fL (ref 6.9–10.0)
Monocytes %: 5 % (ref 2–12)
Monocytes, Absolute: 0.5 10*3/ÂµL (ref 0.2–1.0)
Neutrophils %: 78 % — ABNORMAL HIGH (ref 50–74)
Neutrophils, Absolute: 6.9 10*3/ÂµL (ref 2.0–7.4)
Platelet Count: 164 10*3/??L (ref 150–405)
RBC: 4.35 10*6/ÂµL (ref 3.80–5.60)
RDW: 12.4 % (ref 11.7–16.1)
WBC: 8.9 10*3/ÂµL (ref 4.5–11.0)

## 2015-02-01 LAB — ALPHA FETOPROTEIN, MATERNAL
Alpha Fetoprotein: 1.69
Gestation Age on collection by date (GA): 163 wk,d
Interpretation: NEGATIVE
Maternal Age At EDD: 28
Maternal Weight: 170 [lb_av]

## 2015-02-01 LAB — ARC ANTIBODY ID

## 2015-02-01 LAB — TSH: TSH - Thyroid Stimulating Hormone: 1.82 u[IU]/mL (ref 0.34–5.60)

## 2015-02-02 ENCOUNTER — Encounter: Primary: MD

## 2015-02-02 ENCOUNTER — Ambulatory Visit: Admit: 2015-02-02 | Discharge: 2015-02-02 | Payer: PRIVATE HEALTH INSURANCE | Primary: MD

## 2015-02-02 ENCOUNTER — Institutional Professional Consult (permissible substitution): Admit: 2015-02-02 | Discharge: 2015-02-02 | Primary: MD

## 2015-02-02 ENCOUNTER — Inpatient Hospital Stay: Admit: 2015-02-02 | Discharge: 2015-02-02 | Attending: Maternal & Fetal Medicine | Primary: MD

## 2015-02-02 DIAGNOSIS — O361920 Maternal care for other isoimmunization, second trimester, not applicable or unspecified: Secondary | ICD-10-CM

## 2015-02-02 DIAGNOSIS — O361120 Maternal care for Anti-A sensitization, second trimester, not applicable or unspecified: Secondary | ICD-10-CM

## 2015-02-02 DIAGNOSIS — Z369 Encounter for antenatal screening, unspecified: Secondary | ICD-10-CM

## 2015-02-02 NOTE — Progress Notes (Signed)
Chief Complaint   Patient presents with   . Pregnancy Ultrasound     MCA, PSV, MT (a) isoimmunization     Vitals:    02/02/15 1151   BP: 112/68   BP Location: Right arm   Patient Position: Sitting   Weight: 167 lb (75.8 kg)   Height: 5' 5 (1.651 m)     Body mass index is 27.79 kg/(m^2).    Patient is here for pregnancy ultrasound on a read day.

## 2015-02-03 ENCOUNTER — Encounter: Primary: MD

## 2015-02-08 ENCOUNTER — Encounter: Primary: MD

## 2015-02-09 ENCOUNTER — Encounter: Primary: MD

## 2015-02-24 ENCOUNTER — Encounter: Primary: MD

## 2015-02-25 ENCOUNTER — Encounter: Primary: MD

## 2015-03-03 ENCOUNTER — Ambulatory Visit: Admit: 2015-03-03 | Discharge: 2015-03-03 | Payer: PRIVATE HEALTH INSURANCE | Primary: MD

## 2015-03-03 ENCOUNTER — Institutional Professional Consult (permissible substitution): Admit: 2015-03-03 | Discharge: 2015-03-03 | Primary: MD

## 2015-03-03 ENCOUNTER — Inpatient Hospital Stay: Admit: 2015-03-03 | Discharge: 2015-03-03 | Attending: MD | Primary: MD

## 2015-03-03 DIAGNOSIS — Z369 Encounter for antenatal screening, unspecified: Secondary | ICD-10-CM

## 2015-03-03 DIAGNOSIS — O361121 Maternal care for Anti-A sensitization, second trimester, fetus 1: Secondary | ICD-10-CM

## 2015-03-03 NOTE — Progress Notes (Signed)
Chief Complaint   Patient presents with   . Pregnancy Ultrasound     complete US, MCA, PSV, MT (a) isoimmunization + antibody     Vitals:    03/03/15 1319   BP: 116/62   BP Location: Right arm   Patient Position: Sitting   Weight: 172 lb (78 kg)   Height: 5' 5 (1.651 m)     Body mass index is 28.62 kg/(m^2).    Patient is here for pregnancy ultrasound on Dr. Loralee Pacas schedule.

## 2015-03-03 NOTE — Progress Notes (Signed)
03/03/2015 - 20 5/7w. Fetal anatomy survey.  Isoimmunization.    Growth and anatomy survey appears normal. Fetal size is appropriate for dates.  There is no evidence today of fetal anemia.     Follow-up ultrasound in 3-4 w given risk for fetal anemia.  We will do MCA check at that time. . Thank you for involving our group in your patient's care.  Please feel free to contact me with any questions or concerns.    BSohl

## 2015-03-10 ENCOUNTER — Other Ambulatory Visit: Admit: 2015-03-10 | Primary: MD

## 2015-03-10 LAB — ARC ANTIBODY ID

## 2015-03-14 NOTE — L&D Delivery Note (Signed)
Name: [name]     Birthdate: September 13, 1985  MRN: [id number]    Delivery Summary  History     Newborn  Name: Rachel [name]  Gender: female   DOB: 07/09/2013  at 1:39 PM   Delivery Method: Vaginal, Spontaneous Delivery   Gestational Age: [redacted]w[redacted]d  Birth Weight: 3.104 kg (6 lb 13.5 oz)   Apgars (1 and 5 minutes): 9  , 9    PCP: [facility]    Pregnancy Details  Complications: alloimmunization MTA antibodies, hx of prior fetal anemia not requring transfusion, RNI, hx of PUPP in prior pregnancy, and GBS postive  Plans to breast feed  Social concerns: none Maternal  Name: Rachel [name]  Age: 30 y.o.  G&P s/p delivery: G5P2032  GBS Status: positive  Blood Type: Opositive   Rubella non immune    Delivery Details  Delivery Indication: IOL  Delivery Method: Vaginal, Spontaneous Delivery   Delivery Position:   LOA  Anesthesia:  epidural  Complications: None [0]   Resuscitation: Dry/Stimulate   Delivering Physician:    Dr Laural Benes  Maternal Antibiotics: Yes      Hospital Diagnoses     Diagnoses:  ? Term pregnancy at [redacted]w[redacted]d  ? GBS positive, adequately treated intrapartum  ? NSVD  Delivery of viable female  infant weighing 3.104 kg (6 lb 13.5 oz) , APGARS 9   and 9    ? Lacerations: none required repair    Labor Progression     Stage One  She had overnight cervical ripening with cervidil, which was removed 12 hrs later. She was started on pitocin at 7 AM and 6 cm at about 1 PM. Cervix was complete at 1332. Labor was augmented by pitocin.  Mom did have an epidural.  Interventions included: none  FHT during this stage were Category 1    Stage Two  FHT while pushing were reassuring. Baby descended well, maternal effort was very effective, and no assistance was required with vacuum or forceps. Delivery of a live female  infant weighing 3.104 kg (6 lb 13.5 oz)  with APGARS of 9   and 9   at 1:39 PM . No meconium, nuchal cord, or shoulder dystocia complicated delivery.     Presentation: vertex, LOA  Newborn delivered to maternal abdomen.      Cord clamping was delayed.    The cord was clamped and then cut by FOB.  Pitocin was administered just after delivery of the infant    Stage Three  Placenta delivered with gentle cord traction 5 minutes after delivery of infant. Found to be intact with a 3 vessel cord, no other significant abnormalities. There were no perineal lacerations that required repair.       EBL: 200 mL    Mom and newborn recovering well.  Plans to breast feed.    Shelbie Ammons, MD  07/09/2015, 2:47 PM

## 2015-03-24 ENCOUNTER — Inpatient Hospital Stay: Admit: 2015-03-24 | Discharge: 2015-03-24 | Attending: MD | Primary: MD

## 2015-03-24 ENCOUNTER — Ambulatory Visit: Admit: 2015-03-24 | Discharge: 2015-03-24 | Payer: PRIVATE HEALTH INSURANCE | Primary: MD

## 2015-03-24 ENCOUNTER — Institutional Professional Consult (permissible substitution): Admit: 2015-03-24 | Discharge: 2015-03-24 | Primary: MD

## 2015-03-24 DIAGNOSIS — Z369 Encounter for antenatal screening, unspecified: Secondary | ICD-10-CM

## 2015-03-24 DIAGNOSIS — O361120 Maternal care for Anti-A sensitization, second trimester, not applicable or unspecified: Secondary | ICD-10-CM

## 2015-03-24 NOTE — Progress Notes (Signed)
03/24/2015 - 23 5/7w. Evaluation of fetal growth.  Non Rh isoimmunization.     Fetal growth is appropriate. Normal limited fetal anatomy.  Normal AFI.  Reassuring incidental BPP.   There is no evidence today of fetal anemia.     Continue with previously established plan of approximately q 4 week ultrasounds to evaluate for fetal anemia.     Rachel [name]

## 2015-03-24 NOTE — Progress Notes (Signed)
Chief Complaint   Patient presents with   . Pregnancy Ultrasound     growth     Vitals:    03/24/15 1000   BP: 112/70   BP Location: Right arm   Patient Position: Sitting   Weight: 175 lb (79.4 kg)   Height: 5' 5 (1.651 m)     Body mass index is 29.12 kg/(m^2).    Patient is here for pregnancy ultrasound on Dr. Loralee Pacas schedule.

## 2015-04-02 ENCOUNTER — Ambulatory Visit: Admit: 2015-04-02 | Discharge: 2015-04-06 | Primary: MD

## 2015-04-02 LAB — CBC WITH AUTO DIFFERENTIAL
Basophils %: 1 % (ref 0–2)
Basophils %: 0 % (ref 0–2)
Basophils, Absolute: 0.1 10*3/ÂµL (ref 0.0–0.1)
Basophils, Absolute: 0 10*3/ÂµL (ref 0.0–0.1)
Eosinophils %: 1 % (ref 0–5)
Eosinophils %: 0 % (ref 0–5)
Eosinophils, Absolute: 0.1 10*3/ÂµL (ref 0.0–0.4)
Eosinophils, Absolute: 0 10*3/ÂµL (ref 0.0–0.4)
HCT: 36.9 % (ref 35.0–48.0)
HCT: 37.8 % (ref 35.0–48.0)
Hemoglobin: 12.7 g/dL (ref 11.7–16.5)
Hemoglobin: 12.9 g/dL (ref 11.7–16.5)
Lymphocytes %: 17 % — ABNORMAL LOW (ref 20–40)
Lymphocytes %: 16 % — ABNORMAL LOW (ref 20–40)
Lymphocytes, Absolute: 1.7 10*3/ÂµL (ref 1.0–4.0)
Lymphocytes, Absolute: 1.5 10*3/ÂµL (ref 1.0–4.0)
MCH: 30.8 pg (ref 28.3–33.3)
MCH: 30.6 pg (ref 28.3–33.3)
MCHC: 34.3 g/dL (ref 32.5–36.0)
MCHC: 34.1 g/dL (ref 32.5–36.0)
MCV: 89.7 fL (ref 81.0–100.0)
MCV: 89.6 fL (ref 81.0–100.0)
MPV: 8 fL (ref 6.9–10.0)
MPV: 8 fL (ref 6.9–10.0)
Monocytes %: 6 % (ref 2–12)
Monocytes %: 5 % (ref 2–12)
Monocytes, Absolute: 0.6 10*3/ÂµL (ref 0.2–1.0)
Monocytes, Absolute: 0.4 10*3/ÂµL (ref 0.2–1.0)
Neutrophils %: 76 % — ABNORMAL HIGH (ref 50–74)
Neutrophils %: 79 % — ABNORMAL HIGH (ref 50–74)
Neutrophils, Absolute: 7.8 10*3/ÂµL — ABNORMAL HIGH (ref 2.0–7.4)
Neutrophils, Absolute: 7.7 10*3/ÂµL — ABNORMAL HIGH (ref 2.0–7.4)
Platelet Count: 145 10*3/??L — ABNORMAL LOW (ref 150–405)
Platelet Count: 153 10*3/ÂµL (ref 150–405)
RBC: 4.11 10*6/??L (ref 3.80–5.60)
RBC: 4.21 10*6/ÂµL (ref 3.80–5.60)
RDW: 13 % (ref 11.7–16.1)
RDW: 12.9 % (ref 11.7–16.1)
WBC: 10.3 10*3/ÂµL (ref 4.5–11.0)
WBC: 9.7 10*3/ÂµL (ref 4.5–11.0)

## 2015-04-02 LAB — FIBRINOGEN
Fibrinogen: 296 mg/dL (ref 150–400)
Fibrinogen: 325 mg/dL (ref 150–400)

## 2015-04-02 MED ORDER — acetaminophen (TYLENOL) tablet 650 mg
325 | ORAL | Status: DC | PRN
Start: 2015-04-02 — End: 2015-04-02
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
## 2015-04-05 ENCOUNTER — Other Ambulatory Visit: Admit: 2015-04-05 | Primary: MD

## 2015-04-05 DIAGNOSIS — Z36 Encounter for antenatal screening of mother: Secondary | ICD-10-CM

## 2015-04-05 LAB — CBC WITHOUT DIFFERENTIAL
HCT: 38.1 % (ref 35.0–48.0)
Hemoglobin: 13.1 g/dL (ref 11.7–16.5)
MCH: 30.9 pg (ref 28.3–33.3)
MCHC: 34.3 g/dL (ref 32.5–36.0)
MCV: 90 fL (ref 81.0–100.0)
MPV: 7.9 fL (ref 6.9–10.0)
Platelet Count: 146 10*3/ÂµL — ABNORMAL LOW (ref 150–405)
RBC: 4.23 10*6/??L (ref 3.80–5.60)
RDW: 13.1 % (ref 11.7–16.1)
WBC: 9.4 10*3/??L (ref 4.5–11.0)

## 2015-04-05 LAB — BUN: BUN: 5 mg/dL — ABNORMAL LOW (ref 8–20)

## 2015-04-05 LAB — GLUCOSE, 1 HOUR PERFORMABLE ONLY: OB Glucose, GTT - 1 Hour: 77 mg/dL (ref <140)

## 2015-04-05 LAB — ALT: ALT - Alanine Aminotransferase: 9 U/L (ref 5–33)

## 2015-04-05 LAB — ARC ANTIBODY ID

## 2015-04-05 LAB — RPR WITH REFLEX: VDRL / RPR: NONREACTIVE

## 2015-04-05 LAB — LDH: LD: 139 U/L (ref 135–214)

## 2015-04-05 LAB — URIC ACID: Uric Acid: 3.7 mg/dL (ref 2.6–8.0)

## 2015-04-05 LAB — AST: AST - Aspartate Aminotransferase: 13 U/L (ref 5–32)

## 2015-04-21 ENCOUNTER — Institutional Professional Consult (permissible substitution): Admit: 2015-04-21 | Discharge: 2015-04-21 | Primary: MD

## 2015-04-21 ENCOUNTER — Inpatient Hospital Stay: Admit: 2015-04-21 | Discharge: 2015-04-21 | Attending: Maternal & Fetal Medicine | Primary: MD

## 2015-04-21 ENCOUNTER — Ambulatory Visit: Admit: 2015-04-21 | Discharge: 2015-04-21 | Payer: Commercial Managed Care - PPO | Primary: MD

## 2015-04-21 DIAGNOSIS — Z369 Encounter for antenatal screening, unspecified: Secondary | ICD-10-CM

## 2015-04-21 DIAGNOSIS — O361920 Maternal care for other isoimmunization, second trimester, not applicable or unspecified: Secondary | ICD-10-CM

## 2015-04-21 DIAGNOSIS — O361912 Maternal care for other isoimmunization, first trimester, fetus 2: Secondary | ICD-10-CM

## 2015-04-21 NOTE — Progress Notes (Signed)
Chief Complaint   Patient presents with   . Pregnancy Ultrasound     growth, MCA, PSV, MT (a) isoimmunization, + Antibody      Vitals:    04/21/15 0958   BP: 116/62   BP Location: Left arm   Patient Position: Sitting   Weight: 180 lb (81.6 kg)   Height: 5' 5 (1.651 m)     Body mass index is 29.95 kg/(m^2).    Patient is here for pregnancy ultrasound on Dr. Santo Held schedule.

## 2015-04-24 ENCOUNTER — Inpatient Hospital Stay: Admit: 2015-04-24 | Discharge: 2015-04-24 | Disposition: A | Discharge status: Home / Self Care | Attending: Family

## 2015-04-24 DIAGNOSIS — O98513 Other viral diseases complicating pregnancy, third trimester: Secondary | ICD-10-CM

## 2015-04-24 LAB — INFLUENZA A AND B, MOLECULAR (SLM)
Influenza A: NEGATIVE
Influenza B: NEGATIVE

## 2015-04-24 NOTE — ED Notes (Signed)
Pt given discharge instructions and handout per FNP gansbergs orders. Pt verbalized understanding of discharge instructions. Pt left walking with a steady gait.

## 2015-04-24 NOTE — Discharge Instructions (Signed)
Viral Infections °A viral infection can be caused by different types of viruses. Most viral infections are not serious and resolve on their own. However, some infections may cause severe symptoms and may lead to further complications. °SYMPTOMS °Viruses can frequently cause: °· Minor sore throat. °· Aches and pains. °· Headaches. °· Runny nose. °· Different types of rashes. °· Watery eyes. °· Tiredness. °· Cough. °· Loss of appetite. °· Gastrointestinal infections, resulting in nausea, vomiting, and diarrhea. °These symptoms do not respond to antibiotics because the infection is not caused by bacteria. However, you might catch a bacterial infection following the viral infection. This is sometimes called a "superinfection." Symptoms of such a bacterial infection may include: °· Worsening sore throat with pus and difficulty swallowing. °· Swollen neck glands. °· Chills and a high or persistent fever. °· Severe headache. °· Tenderness over the sinuses. °· Persistent overall ill feeling (malaise), muscle aches, and tiredness (fatigue). °· Persistent cough. °· Yellow, green, or brown mucus production with coughing. °HOME CARE INSTRUCTIONS  °· Only take over-the-counter or prescription medicines for pain, discomfort, diarrhea, or fever as directed by your caregiver. °· Drink enough water and fluids to keep your urine clear or pale yellow. Sports drinks can provide valuable electrolytes, sugars, and hydration. °· Get plenty of rest and maintain proper nutrition. Soups and broths with crackers or rice are fine. °SEEK IMMEDIATE MEDICAL CARE IF:  °· You have severe headaches, shortness of breath, chest pain, neck pain, or an unusual rash. °· You have uncontrolled vomiting, diarrhea, or you are unable to keep down fluids. °· You or your child has an oral temperature above 102° F (38.9° C), not controlled by medicine. °· Your baby is older than 3 months with a rectal temperature of 102° F (38.9° C) or higher. °· Your baby is 3  months old or younger with a rectal temperature of 100.4° F (38° C) or higher. °MAKE SURE YOU:  °· Understand these instructions. °· Will watch your condition. °· Will get help right away if you are not doing well or get worse. °  °This information is not intended to replace advice given to you by your health care provider. Make sure you discuss any questions you have with your health care provider. °  °Document Released: 12/07/2004 Document Revised: 05/22/2011 Document Reviewed: 08/05/2014 °Elsevier Interactive Patient Education ©2016 [employer] ° °
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
## 2015-04-24 NOTE — ED Triage Notes (Signed)
Since yesterday has been having flu like symptoms including headache, fever, body aches. Last took tylenol about an hour and a half ago. [redacted] weeks pregnant. No reported pregnancy related symptoms

## 2015-04-27 ENCOUNTER — Ambulatory Visit: Admit: 2015-04-27 | Discharge: 2015-04-27 | Attending: Medical | Primary: MD

## 2015-04-27 ENCOUNTER — Other Ambulatory Visit: Admit: 2015-04-27 | Discharge: 2015-04-27 | Primary: MD

## 2015-04-27 DIAGNOSIS — O98513 Other viral diseases complicating pregnancy, third trimester: Secondary | ICD-10-CM

## 2015-04-27 DIAGNOSIS — J029 Acute pharyngitis, unspecified: Secondary | ICD-10-CM

## 2015-04-27 LAB — RAPID STREP SCREEN: Strep A Antigen: NEGATIVE

## 2015-04-27 LAB — THROAT CULTURE FOR BETA STREP: Culture Result: NEGATIVE

## 2015-04-27 LAB — INFLUENZA A AND B, MOLECULAR (SLM)
Influenza A: NEGATIVE
Influenza B: NEGATIVE

## 2015-04-27 NOTE — Progress Notes (Signed)
Patient: [id number] - Rachel [name]  DOB:  24-Nov-1985    Date:  04/27/15  Provider: Oswald Hillock [facility]  Encounter: Office Visit    Chief Complaint   Patient presents with   . Cough       HPI  [name] is a 30 y.o. female here for evaluation of Pt started sneezing Friday at work then went home and got worse very quickly. Now has fever sore throat, cough, chest tightness, SOB and decreased appetite. Pt is [redacted] weeks pregnant. She was swabbed for influenza Saturday at the ER and it was negative.  Yesterday saw Dr. Laural Benes and felt 100% better. Then last night at 9 pm-HA, neck pain, nasal congestion, cough, difficulty breathing, cough-mostly dry but brown and yellow sputum-occasionally but not very often-mostly in the mornings.  Denies nausea, vomiting.  Aggravates everything.  Alleviates nothing.    Review of Systems   Constitutional: Positive for chills and malaise/fatigue.   HENT: Positive for congestion and sore throat. Negative for ear pain.    Respiratory: Positive for cough. Negative for shortness of breath and wheezing.    Cardiovascular: Negative for chest pain, palpitations and leg swelling.   Gastrointestinal: Negative for abdominal pain, diarrhea, nausea and vomiting.   Genitourinary: Negative for dysuria and urgency.   Musculoskeletal: Negative for myalgias.   Skin: Negative for rash.   Neurological: Positive for headaches.       History   Smoking Status   . Former Smoker   . Packs/day: 0.50   . Years: 8.00   . Types: Cigarettes   . Quit date: 03/14/2011   Smokeless Tobacco   . Never Used       Allergies   Allergen Reactions   . Adhesive Hives   . Latex Hives and Rash   . Sulfa (Sulfonamide Antibiotics) Hives and Rash   . Morphine Other (See Comments)     Headache   . Norco [Hydrocodone-Acetaminophen] Other (See Comments)     Nightmares       Current Outpatient Prescriptions   Medication   . acetaminophen-caff-pyrilamine 500-60-15 mg Tab   . herbal drugs (COLON HERBAL CLEANSER) Cap   . ibuprofen  (ADVIL,MOTRIN) 200 MG tablet   . INULIN (FIBER GUMMIES ORAL)   . L. ACIDOPHILUS/BIFID. ANIMALIS (CHEWABLE PROBIOTIC ORAL)   . linaclotide (LINZESS) 290 mcg Cap   . modafinil (PROVIGIL) 200 MG tablet   . modafinil (PROVIGIL) 200 MG tablet   . multivitamin (THERAGRAN) per tablet   . omeprazole (PRILOSEC) 20 MG capsule   . phentermine (ADIPEX-P) 37.5 mg tablet   . PNV95/FERROUS FUMARATE/FA (PRENATAL MULTIVITAMINS ORAL)   . prasterone, dhea, (DHEA) 25 mg Cap     No current facility-administered medications for this visit.        LABS:   Recent Results (from the past 168 hour(s))   Influenza A and B, Molecular -STAT    Collection Time: 04/24/15  2:27 PM   Result Value Ref Range    Influenza A Negative Negative    Influenza B Negative Negative   Quick Strep Test (Group A)    Collection Time: 04/27/15 12:36 PM   Result Value Ref Range    Strep A Antigen Negative Negative   Throat Culture for Beta Strep    Collection Time: 04/27/15 12:36 PM   Result Value Ref Range    Culture Result Negative for beta-hemolytic Group A Strep    Rapid Influenza A & B by NAAT    Collection Time:  04/27/15  2:26 PM   Result Value Ref Range    Influenza A Negative Negative    Influenza B Negative Negative   CBC with Auto Differential -STAT    Collection Time: 04/28/15 11:59 AM   Result Value Ref Range    WBC 11.8 (H) 4.5 - 11.0 k/uL    RBC 4.07 3.80 - 5.60 ma/uL    Hemoglobin 12.7 11.7 - 16.5 g/dL    HCT 16.1 09.6 - 04.5 %    MCV 87.5 81.0 - 100.0 fL    MCH 31.2 28.3 - 33.3 pg    MCHC 35.7 32.5 - 36.0 g/dL    RDW 40.9 81.1 - 91.4 %    Platelet Count 176 150 - 405 k/uL    MPV 8.8 6.9 - 10.0 fL    Neutrophils % 79 (H) 50 - 74 %    Lymphocytes % 13 (L) 20 - 40 %    Neutrophils, Absolute 9.3 (H) 2.0 - 7.4 k/uL    Lymphocytes, Absolute 1.6 1.0 - 4.0 k/uL    Mixed % 7.7 1.3 - 14.7 %    Mixed, Absolute 0.9 0.1 - 1.5 k/uL       RADIOLOGY:      Vitals:    04/27/15 1224   BP: 112/76   BP Location: Left arm   Patient Position: Sitting   Pulse: 84   Temp:  37.6 ?C (99.6 ?F)   TempSrc: Tympanic   SpO2: 98%     There is no height or weight on file to calculate BMI.    Physical Exam   Constitutional: She is well-developed, well-nourished, and in no distress.   HENT:   Head: Normocephalic and atraumatic.   Right Ear: Tympanic membrane, external ear and ear canal normal.   Left Ear: Tympanic membrane, external ear and ear canal normal.   Mouth/Throat: Oropharynx is clear and moist. No oropharyngeal exudate.       Nasal mucosa is erythematous and edematous, no purulent drainage visualized.   Eyes: Conjunctivae are normal. No scleral icterus.   Neck: Neck supple.   Cardiovascular: Normal rate, regular rhythm and normal heart sounds.    Pulmonary/Chest: Effort normal. No stridor. No respiratory distress. She has no wheezes. She has rales (Heard rales right upper lung field-cleared post cough.). She exhibits no tenderness.   Abdominal: Soft. Bowel sounds are normal. She exhibits no mass. There is no tenderness.   Musculoskeletal: She exhibits no edema.   Lymphadenopathy:     She has no cervical adenopathy.   Neurological: She is alert. Gait normal.   Skin: Skin is warm and dry. She is not diaphoretic.   Nursing note and vitals reviewed.      ASSESSMENT & PLAN:    ICD-9-CM ICD-10-CM    1. Upper respiratory tract infection, unspecified type 465.9 J06.9 CBC with Auto Differential -STAT   2. Sore throat 462 J02.9 Quick Strep Test (Group A)      Rapid Influenza A & B by NAAT   3. [redacted] weeks gestation of pregnancy V22.2 Z3A.29    4. SOB (shortness of breath) 786.05 R06.02 X-ray chest PA and lateral   Flu and strep neg. Duration of most recent symptoms is 1 day.  Discussed possibility of sinusitis verses bronchitis verses pneumonia.  Discussed labs and xray.  ---01/26/16-patient called saying she felt worse with more frequent productive cough-brown sputum-ordered a CBC and xray (okayed by Dr. Deveron Furlong OB) (Discussed potential radiation exposure to the patient and fetus-patient  wishes to proceed.)-patient said she would go to the hospital to have these done-she then reported back to the clinic to discuss results.-WBC slightly elevated possible WNL elevation.  Chest xray clear-Again she had erythematous and edematous nasal mucosa but no purulent drainage was visualized, Lungs: CTAB.  Patient looked better-she said she felt better-not coughing as much-no sputum-less chest pain with coughing.  At this point the most likely diagnosis is viral URI-symptomatic care-nasal saline, humidifier, hot showers, tylenol-close f/u-if symptoms worsen or do not get better over the next couple days she may benefit from a z-pack but I am highly suspicious that this is a viral URI.  RTC for f/u if symptoms worsen or do not improve.  See patient instructions.

## 2015-04-27 NOTE — Progress Notes (Signed)
Pt started sneezing Friday at work then went home and got worse very quickly. Now has sore throat, cough, chest tightness, SOB and decreased appetite. Pt is [redacted] weeks pregnant. She was swabbed for influenza Saturday at the ER and it was negative.

## 2015-04-27 NOTE — Telephone Encounter (Signed)
Notified pt via phone. She will return if not improving.

## 2015-04-27 NOTE — Telephone Encounter (Signed)
-----   Message from Oswald Hillock, [place] sent at 04/27/2015  3:15 PM PST -----  Please notify:negative for flu RTC if symptoms continue or get worse.  Thanks!

## 2015-04-28 ENCOUNTER — Other Ambulatory Visit: Admit: 2015-04-28 | Discharge: 2015-04-28 | Primary: MD

## 2015-04-28 ENCOUNTER — Inpatient Hospital Stay: Admit: 2015-04-28 | Discharge: 2015-04-29 | Attending: Medical | Primary: MD

## 2015-04-28 DIAGNOSIS — J069 Acute upper respiratory infection, unspecified: Secondary | ICD-10-CM

## 2015-04-28 DIAGNOSIS — R0602 Shortness of breath: Secondary | ICD-10-CM

## 2015-04-28 LAB — CBC WITH AUTO DIFFERENTIAL
HCT: 35.6 % (ref 35.0–48.0)
Hemoglobin: 12.7 g/dL (ref 11.7–16.5)
Lymphocytes %: 13 % — ABNORMAL LOW (ref 20–40)
Lymphocytes, Absolute: 1.6 10*3/uL (ref 1.0–4.0)
MCH: 31.2 pg (ref 28.3–33.3)
MCHC: 35.7 g/dL (ref 32.5–36.0)
MCV: 87.5 fL (ref 81.0–100.0)
MPV: 8.8 fL (ref 6.9–10.0)
Mixed %: 7.7 % (ref 1.3–14.7)
Mixed, Absolute: 0.9 10*3/uL (ref 0.1–1.5)
Neutrophils %: 79 % — ABNORMAL HIGH (ref 50–74)
Neutrophils, Absolute: 9.3 10*3/uL — ABNORMAL HIGH (ref 2.0–7.4)
Platelet Count: 176 10*3/uL (ref 150–405)
RBC: 4.07 ma/uL (ref 3.80–5.60)
RDW: 12.1 % (ref 11.7–16.1)
WBC: 11.8 10*3/uL — ABNORMAL HIGH (ref 4.5–11.0)

## 2015-04-28 NOTE — Telephone Encounter (Signed)
Notified pt via phone of cbc and neg strep culture. She will come in after she gets her daughter from school to have chest xray done.

## 2015-04-28 NOTE — Telephone Encounter (Signed)
-----   Message from Oswald Hillock, [place] sent at 04/28/2015 12:57 PM PST -----  Please notify: WBC elevate- needs chest xray..  Thanks!

## 2015-04-29 NOTE — Telephone Encounter (Signed)
Tell her normal

## 2015-04-29 NOTE — Telephone Encounter (Signed)
Rachel [name] was to see Nehemiah Settle yesterday and did a chest xray and she is wanting the results :  Lm//  [phone]

## 2015-04-29 NOTE — Telephone Encounter (Signed)
Rachel [name] calls in asking about the results of her chest x-ray she had done yesterday. She asks for a call back to discuss. She states that [facility], [place] was trying to rule out pneumonia.

## 2015-04-29 NOTE — Telephone Encounter (Signed)
FYI , let her know xray was normal /   She hadn't been given any ABX,   She states she feels alittle better today ,   As she is pregnant, so wasn't given anything med wise,    lm

## 2015-05-11 ENCOUNTER — Other Ambulatory Visit: Admit: 2015-05-11 | Primary: MD

## 2015-05-11 ENCOUNTER — Other Ambulatory Visit: Primary: MD

## 2015-05-11 DIAGNOSIS — O0990 Supervision of high risk pregnancy, unspecified, unspecified trimester: Secondary | ICD-10-CM

## 2015-05-11 LAB — ARC ANTIBODY ID

## 2015-05-16 LAB — WET PREP, GENITAL
Clue Cells: NONE SEEN
Spermatozoa: NONE SEEN
Trichomonas: NONE SEEN

## 2015-05-16 LAB — URINALYSIS, MACROSCOPIC PERFORMABLE (SLM)
Bilirubin, Urine: NEGATIVE
Blood, Urine: NEGATIVE
Glucose, Urine: NEGATIVE mg/dL
Ketones, Urine: NEGATIVE mg/dL
Leukocyte Esterase, Urine: NEGATIVE
Nitrite, Urine: NEGATIVE
Protein, Urine: NEGATIVE mg/dL
Specific Gravity, Urine: 1 — ABNORMAL LOW (ref 1.005–1.030)
Urobilinogen, Urine: <2 mg/dL (ref 0.0–2.0)
pH, UA: 6.5 (ref 5.0–7.5)

## 2015-05-16 LAB — URINALYSIS, MICROSCOPIC
Bacteria, Urine: NONE SEEN /HPF
RBC, Urine: 0 /HPF (ref 0–4)
Renal/Transitional Epithelial, Urine: 0 /HPF (ref 0–2)
Squamous Epithelial, UA: 1 /HPF (ref 0–4)
WBC, Urine: <1 /HPF (ref 0–4)

## 2015-05-16 LAB — FETAL FIBRONECTIN: Fetal Fibronectin: NEGATIVE

## 2015-05-16 LAB — URINE CULTURE (SLM)

## 2015-05-16 LAB — RUPTURE OF FETAL MEMBRANE: Detection of PAMG 1: NEGATIVE
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
## 2015-05-19 ENCOUNTER — Ambulatory Visit: Admit: 2015-05-19 | Discharge: 2015-05-19 | Payer: Commercial Managed Care - PPO | Primary: MD

## 2015-05-19 ENCOUNTER — Institutional Professional Consult (permissible substitution): Admit: 2015-05-19 | Discharge: 2015-05-20 | Primary: MD

## 2015-05-19 ENCOUNTER — Inpatient Hospital Stay: Admit: 2015-05-19 | Discharge: 2015-05-20 | Attending: Maternal & Fetal Medicine | Primary: MD

## 2015-05-19 DIAGNOSIS — O361920 Maternal care for other isoimmunization, second trimester, not applicable or unspecified: Secondary | ICD-10-CM

## 2015-05-19 DIAGNOSIS — O360920 Maternal care for other rhesus isoimmunization, second trimester, not applicable or unspecified: Secondary | ICD-10-CM

## 2015-05-19 DIAGNOSIS — Z369 Encounter for antenatal screening, unspecified: Secondary | ICD-10-CM

## 2015-05-19 NOTE — Progress Notes (Signed)
Chief Complaint   Patient presents with   . Pregnancy Ultrasound     growth, MCA, + antibody     Vitals:    05/19/15 1517   BP: 118/74   BP Location: Right arm   Patient Position: Sitting   Weight: 186 lb (84.4 kg)   Height: 5' 5 (1.651 m)     Body mass index is 30.95 kg/(m^2).    Patient is here for pregnancy ultrasound on Dr. Santo Held schedule.

## 2015-06-02 ENCOUNTER — Ambulatory Visit: Admit: 2015-06-02 | Discharge: 2015-06-02 | Payer: Commercial Managed Care - PPO | Primary: MD

## 2015-06-02 ENCOUNTER — Institutional Professional Consult (permissible substitution): Admit: 2015-06-02 | Discharge: 2015-06-02 | Primary: MD

## 2015-06-02 ENCOUNTER — Inpatient Hospital Stay: Admit: 2015-06-02 | Discharge: 2015-06-02 | Attending: Maternal & Fetal Medicine | Primary: MD

## 2015-06-02 DIAGNOSIS — Z369 Encounter for antenatal screening, unspecified: Secondary | ICD-10-CM

## 2015-06-02 DIAGNOSIS — O360920 Maternal care for other rhesus isoimmunization, second trimester, not applicable or unspecified: Secondary | ICD-10-CM

## 2015-06-02 DIAGNOSIS — O361930 Maternal care for other isoimmunization, third trimester, not applicable or unspecified: Secondary | ICD-10-CM

## 2015-06-02 NOTE — Progress Notes (Signed)
Chief Complaint   Patient presents with   . Pregnancy Ultrasound     BPP     Vitals:    06/02/15 1007   BP: 114/72   BP Location: Right arm   Patient Position: Sitting   Weight: 190 lb (86.2 kg)   Height: 5' 5 (1.651 m)     Body mass index is 31.62 kg/(m^2).    Patient is here for pregnancy ultrasound on a read day.

## 2015-06-04 ENCOUNTER — Ambulatory Visit: Admit: 2015-06-05 | Primary: MD

## 2015-06-04 ENCOUNTER — Observation Stay
Admit: 2015-06-04 | Discharge: 2015-06-05 | Disposition: A | Discharge status: Home / Self Care | Payer: PRIVATE HEALTH INSURANCE | Source: Ambulatory Visit | Attending: DO | Admitting: DO

## 2015-06-04 DIAGNOSIS — O26893 Other specified pregnancy related conditions, third trimester: Secondary | ICD-10-CM

## 2015-06-04 LAB — CBC WITH AUTO DIFFERENTIAL
Basophils %: 1 % (ref 0–2)
Basophils, Absolute: 0.1 10*3/??L (ref 0.0–0.1)
Eosinophils %: 0 % (ref 0–5)
Eosinophils, Absolute: 0 10*3/ÂµL (ref 0.0–0.4)
HCT: 40 % (ref 35.0–48.0)
Hemoglobin: 13.5 g/dL (ref 11.7–16.5)
Lymphocytes %: 16 % — ABNORMAL LOW (ref 20–40)
Lymphocytes, Absolute: 2 10*3/ÂµL (ref 1.0–4.0)
MCH: 29.2 pg (ref 28.3–33.3)
MCHC: 33.7 g/dL (ref 32.5–36.0)
MCV: 86.8 fL (ref 81.0–100.0)
MPV: 9.8 fL (ref 6.9–10.0)
Monocytes %: 7 % (ref 2–12)
Monocytes, Absolute: 0.9 10*3/ÂµL (ref 0.2–1.0)
Neutrophils %: 76 % — ABNORMAL HIGH (ref 50–74)
Neutrophils, Absolute: 9.6 10*3/ÂµL — ABNORMAL HIGH (ref 2.0–7.4)
Platelet Count: 137 10*3/??L — ABNORMAL LOW (ref 150–405)
RBC: 4.61 10*6/ÂµL (ref 3.80–5.60)
RDW: 12.6 % (ref 11.7–16.1)
WBC: 12.6 10*3/ÂµL — ABNORMAL HIGH (ref 4.5–11.0)

## 2015-06-04 LAB — COMPREHENSIVE METABOLIC PANEL
ALT - Alanine Aminotransferase: 9 U/L (ref 5–33)
AST - Aspartate Aminotransferase: 14 U/L (ref 5–32)
Albumin/Globulin Ratio: 1.1 — ABNORMAL LOW (ref 1.2–2.2)
Albumin: 3.6 g/dL (ref 3.5–5.0)
Alkaline Phosphatase: 97 U/L (ref 35–105)
Anion Gap: 19 mmol/L (ref 12.0–20.0)
BUN / Creatinine Ratio: 15.2 (ref 12.0–20.0)
BUN: 5 mg/dL — ABNORMAL LOW (ref 8–20)
Bilirubin Total: 0.3 mg/dL (ref 0.10–1.70)
CO2 - Carbon Dioxide: 20 mmol/L — ABNORMAL LOW (ref 22–32)
Calcium: 8.4 mg/dL — ABNORMAL LOW (ref 8.6–10.6)
Chloride: 103 mmol/L (ref 101.0–111.0)
Creatinine: 0.33 mg/dL — ABNORMAL LOW (ref 0.60–1.30)
Glomerular Filtration Rate Estimate: >60 mL/min/{1.73_m2} (ref >=60.0)
Glucose: 82 mg/dL (ref 74–106)
Osmolality Calculation: 272 mOsm/kg — ABNORMAL LOW (ref 275.0–300.0)
Potassium: 3.6 mmol/L (ref 3.50–5.10)
Protein Total: 6.8 g/dL (ref 6.1–7.9)
Sodium: 138 mmol/L (ref 135–145)

## 2015-06-04 LAB — CK: CK (Creatine Kinase): 34 U/L (ref 26–192)

## 2015-06-04 LAB — TSH: TSH - Thyroid Stimulating Hormone: 2.02 u[IU]/mL (ref 0.34–5.60)

## 2015-06-04 LAB — CKMB: CKMB: <1 ng/mL (ref 0.6–6.3)

## 2015-06-04 LAB — TROPONIN T: Troponin T: <0.01 ng/mL (ref 0.00–0.01)

## 2015-06-04 LAB — MAGNESIUM: Magnesium: 2 mg/dL (ref 1.3–2.5)

## 2015-06-04 LAB — MYOGLOBIN: Myoglobin: <21 ng/mL — ABNORMAL LOW (ref 25–58)

## 2015-06-04 LAB — PHOSPHORUS: Phosphorus: 3.8 mg/dL (ref 2.5–4.7)

## 2015-06-04 MED ORDER — lactated ringers bolus 1,000 mL
Freq: Once | INTRAVENOUS | Status: AC
Start: 2015-06-04 — End: 2015-06-04
  Administered 2015-06-04: 22:00:00 via INTRAVENOUS

## 2015-06-04 MED ORDER — hydrOXYzine HCl (ATARAX) tablet 100 mg
50 | Freq: Once | ORAL | Status: AC
Start: 2015-06-04 — End: 2015-06-04
  Administered 2015-06-05: 50 mg via ORAL

## 2015-06-04 MED ORDER — GI cocktail w/o donnatal
Freq: Once | ORAL | Status: AC
Start: 2015-06-04 — End: 2015-06-04
  Administered 2015-06-04: 22:00:00 via ORAL

## 2015-06-04 NOTE — Discharge Instructions (Signed)
If chest pain reoccurs or you have any other complications please return to the emergency department.
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
## 2015-06-04 NOTE — Discharge Summary (Signed)
DISCHARGE SUMMARY  [facility] Hospitalists     Name: Rachel [name]  DOB: December 16, 1985 30 y.o.  MRN: [id number]  CSN: [id number]  PCP: Rubin Payor, MD    Admit Date  06/04/2015    Discharge Date  06/04/2015    ADMITTING ATTENDING  Claudina Lick, DO    dISCHARGING aTTENDING  Claudina Lick, DO    discharge DiagnosEs    Active Hospital Problems    Diagnosis SNOMED CT(R) Date Noted   . Chest tightness or pressure TIGHT CHEST 06/04/2015   . [redacted] weeks gestation of pregnancy GESTATION PERIOD, 34 WEEKS 06/04/2015   . Chest pressure CHEST DISCOMFORT 06/04/2015       CONSULTS  None    PROCEDURES  None    Key LAB summary from hospitalization  1. WBC 12.6, 76%N; close to baseline for this pregnancy.  2. Platelets 137; baseline 145-176 for this pregnancy.  3. TSH 2.02  4. Magnesium 2.0  5. Phosphorus 3.8  6. Normal CMP  7. Troponin T <0.01    pATIENT SUMMARY  Rachel [name] was admitted for further workup of chest heaviness occurring over the past week.  Lab work up, telemetry, and echocardiography were all negative for a cardiac etiology.  We did offer continued observation overnight in the PCU, but the patient really wanted to D/C to home.  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      Hospital COURSE BY PROBLEM LIST    Chest tightness or pressure  - Certainly DDx could include anxiety, likely GAD. However, wanted rule out cardiac etiology as well, as hypotension is usually not associated with an anxiety attack.   - No evidence of cardiac ischemia on laboratory evaluation.  - STAT echocardiography did not reveal any evidence of pericardial effusion or cardiomyopathy.  - No events on telemetry.   - Hydroxyzine did relieve symptoms, so this may just be anxiety.  We will give her an Rx of PRN hydroxyzine to see if this helps.      ?  Pregnancy at 34 weeks  - Stable at this time, baby reactive on monitor, no evidence of contractions on tocometry.   - q6hr NSTs done by [facility] nursing while admitted.   -  Patient received full OB triage at the [facility] prior to admission by medicine.  Please see their separate documentation from the same date.  - Continue to follow up with Dr. Laural Benes and MFM as directed.       Discharge Medication     Patient's Discharge Medication List      TAKE these medications       Indications of Use    acetaminophen-caff-pyrilamine 500-60-15 mg Tab   Take 2 tablets by mouth as needed (Midol).        CHEWABLE PROBIOTIC ORAL   Take 1 tablet by mouth every morning.        COLON HERBAL CLEANSER Cap   Generic drug:  herbal drugs   Take by mouth.        DHEA 25 mg Cap   Generic drug:  prasterone (dhea)   Take 25 mg by mouth every morning.        FIBER GUMMIES ORAL   Take 1 tablet by mouth every morning.        hydrOXYzine HCl 50 MG tablet   Quantity:  30 tablet   Commonly known as:  ATARAX   Take 1 tablet (50 mg total) by mouth  2 (two) times daily as needed for Anxiety.        ibuprofen 200 MG tablet   Commonly known as:  ADVIL,MOTRIN   Take 800 mg by mouth as needed for Pain.        LINZESS 290 mcg Cap   Generic drug:  linaclotide   Take 290 mcg by mouth daily.        modafinil 200 MG tablet   What changed:  Another medication with the same name was removed. Continue taking this medication, and follow the directions you see here.   Commonly known as:  PROVIGIL   Take 200 mg by mouth as needed.        multivitamin per tablet   Commonly known as:  THERAGRAN   Take 1 tablet by mouth every morning.        omeprazole 20 MG capsule   Commonly known as:  PriLOSEC   Take 20 mg by mouth every morning.        phentermine 37.5 mg tablet   Commonly known as:  ADIPEX-P   Take 37.5 mg by mouth as needed (takes only 1/2 tablet).        PRENATAL MULTIVITAMINS ORAL   Take by mouth.              Disposition  Rachel [name] is being discharged to home in good condition with activity as tolerated and on a regular diet.    PATIENT INSTRUCTIONS/FOLLOW-UP APPOINTMENTS:     follow up instructions  Follow-up with Marshell Garfinkel, MD as scheduled.      Claudina Lick, DO   06/04/2015   9:38 PM  Pager: [phone]

## 2015-06-04 NOTE — Progress Notes (Signed)
Patient left with all of her belongings. She was walked down to the ER entrance by this RN. IV and tele removed prior to discharge.

## 2015-06-04 NOTE — Progress Notes (Signed)
EKG COMPLETED AT 1429 BY Felicia Bloomquist [facility] 06/04/2015

## 2015-06-05 MED ORDER — hydrOXYzine HCl (ATARAX) 50 MG tablet
50 | ORAL_TABLET | Freq: Two times a day (BID) | ORAL | 0 refills | 30.00000 days | Status: DC | PRN
Start: 2015-06-05 — End: 2015-08-06

## 2015-06-16 ENCOUNTER — Other Ambulatory Visit: Admit: 2015-06-17 | Primary: MD

## 2015-06-16 DIAGNOSIS — Z36 Encounter for antenatal screening of mother: Secondary | ICD-10-CM

## 2015-06-16 LAB — GROUP B STREP CULTURE: Culture Result: POSITIVE

## 2015-06-17 ENCOUNTER — Institutional Professional Consult (permissible substitution): Admit: 2015-06-17 | Discharge: 2015-06-17 | Primary: MD

## 2015-06-17 ENCOUNTER — Ambulatory Visit: Admit: 2015-06-17 | Discharge: 2015-06-17 | Payer: Commercial Managed Care - PPO | Primary: MD

## 2015-06-17 ENCOUNTER — Inpatient Hospital Stay: Admit: 2015-06-17 | Discharge: 2015-06-17 | Attending: Maternal & Fetal Medicine | Primary: MD

## 2015-06-17 DIAGNOSIS — O361930 Maternal care for other isoimmunization, third trimester, not applicable or unspecified: Secondary | ICD-10-CM

## 2015-06-17 DIAGNOSIS — Z369 Encounter for antenatal screening, unspecified: Secondary | ICD-10-CM

## 2015-06-17 NOTE — Progress Notes (Signed)
Chief Complaint   Patient presents with   . Pregnancy Ultrasound     growth     Vitals:    06/17/15 1305   BP: 112/70   BP Location: Right arm   Patient Position: Sitting   Weight: 189 lb (85.7 kg)   Height: 5' 5 (1.651 m)     Body mass index is 31.45 kg/(m^2).    Patient is here for pregnancy ultrasound on a read day.

## 2015-06-18 ENCOUNTER — Other Ambulatory Visit: Admit: 2015-06-18 | Primary: MD

## 2015-06-18 DIAGNOSIS — Z36 Encounter for antenatal screening of mother: Secondary | ICD-10-CM

## 2015-06-18 LAB — CBC WITHOUT DIFFERENTIAL
HCT: 36.8 % (ref 35.0–48.0)
Hemoglobin: 12.4 g/dL (ref 11.7–16.5)
MCH: 29.2 pg (ref 28.3–33.3)
MCHC: 33.7 g/dL (ref 32.5–36.0)
MCV: 86.7 fL (ref 81.0–100.0)
MPV: 9.2 fL (ref 6.9–10.0)
Platelet Count: 118 10*3/??L — ABNORMAL LOW (ref 150–405)
RBC: 4.25 10*6/??L (ref 3.80–5.60)
RDW: 13.3 % (ref 11.7–16.1)
WBC: 11.2 10*3/ÂµL — ABNORMAL HIGH (ref 4.5–11.0)
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
## 2015-06-25 ENCOUNTER — Inpatient Hospital Stay: Attending: MD | Primary: MD
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
## 2015-06-30 LAB — URINALYSIS, MACROSCOPIC PERFORMABLE (SLM)
Bilirubin, Urine: NEGATIVE
Blood, Urine: NEGATIVE
Glucose, Urine: NEGATIVE mg/dL
Ketones, Urine: NEGATIVE mg/dL
Nitrite, Urine: NEGATIVE
Protein, Urine: NEGATIVE mg/dL
Specific Gravity, Urine: 1.004 — ABNORMAL LOW (ref 1.005–1.030)
Urobilinogen, Urine: <2 mg/dL (ref 0.0–2.0)
pH, UA: 7.5 (ref 5.0–7.5)

## 2015-06-30 LAB — URINALYSIS, MICROSCOPIC
Bacteria, Urine: NONE SEEN /HPF
RBC, Urine: 1 /HPF (ref 0–4)
Renal/Transitional Epithelial, Urine: 0 /HPF (ref 0–2)
Squamous Epithelial, UA: <1 /HPF (ref 0–4)
WBC, Urine: 4 /HPF (ref 0–4)

## 2015-06-30 LAB — URINE CULTURE (SLM)

## 2015-06-30 LAB — WET PREP, GENITAL
Clue Cells: NONE SEEN
Spermatozoa: NONE SEEN
Trichomonas: NONE SEEN
Yeast: NONE SEEN
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
## 2015-07-01 ENCOUNTER — Institutional Professional Consult (permissible substitution): Admit: 2015-07-01 | Discharge: 2015-07-01 | Primary: MD

## 2015-07-01 ENCOUNTER — Inpatient Hospital Stay: Admit: 2015-07-01 | Discharge: 2015-07-01 | Attending: Maternal & Fetal Medicine | Primary: MD

## 2015-07-01 ENCOUNTER — Ambulatory Visit: Admit: 2015-07-01 | Discharge: 2015-07-01 | Payer: Commercial Managed Care - PPO | Primary: MD

## 2015-07-01 DIAGNOSIS — O361930 Maternal care for other isoimmunization, third trimester, not applicable or unspecified: Secondary | ICD-10-CM

## 2015-07-01 DIAGNOSIS — O361123 Maternal care for Anti-A sensitization, second trimester, fetus 3: Secondary | ICD-10-CM

## 2015-07-01 DIAGNOSIS — Z369 Encounter for antenatal screening, unspecified: Secondary | ICD-10-CM

## 2015-07-01 NOTE — Progress Notes (Signed)
Chief Complaint   Patient presents with   . Pregnancy Ultrasound     MCA, + antibody     Vitals:    07/01/15 1353   BP: 106/70   BP Location: Right arm   Patient Position: Sitting   Weight: 192 lb (87.1 kg)   Height: 5' 5 (1.651 m)     Body mass index is 31.95 kg/(m^2).    Patient is here for pregnancy ultrasound on a read day.

## 2015-07-08 ENCOUNTER — Inpatient Hospital Stay
Admit: 2015-07-08 | Discharge: 2015-07-11 | Disposition: A | Discharge status: Home / Self Care | Payer: PRIVATE HEALTH INSURANCE | Source: Ambulatory Visit | Attending: MD | Admitting: MD

## 2015-07-08 ENCOUNTER — Inpatient Hospital Stay: Attending: MD | Primary: MD

## 2015-07-08 DIAGNOSIS — O361930 Maternal care for other isoimmunization, third trimester, not applicable or unspecified: Secondary | ICD-10-CM
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
## 2015-07-09 ENCOUNTER — Encounter: Primary: MD

## 2015-07-09 LAB — ANTIBODY SCREEN (HCLL): Antibody Screen: NEGATIVE

## 2015-07-09 LAB — CBC WITHOUT DIFFERENTIAL
HCT: 37.3 % (ref 35.0–48.0)
Hemoglobin: 12.5 g/dL (ref 11.7–16.5)
MCH: 28.9 pg (ref 28.3–33.3)
MCHC: 33.6 g/dL (ref 32.5–36.0)
MCV: 86.2 fL (ref 81.0–100.0)
MPV: 9 fL (ref 6.9–10.0)
Platelet Count: 115 10*3/??L — ABNORMAL LOW (ref 150–405)
RBC: 4.33 10*6/??L (ref 3.80–5.60)
RDW: 13.6 % (ref 11.7–16.1)
WBC: 11.7 10*3/ÂµL — ABNORMAL HIGH (ref 4.5–11.0)

## 2015-07-09 LAB — ABO/RH (HCLL): Rh (D): POSITIVE

## 2015-07-09 MED ORDER — benzocaine-lanolin-aloe vera (DERMOPLAST) 20-0.5 % topical spray
20-0.5 | Freq: Four times a day (QID) | TOPICAL | Status: DC | PRN
Start: 2015-07-09 — End: 2015-07-11

## 2015-07-09 MED ORDER — measles, mumps and rubella vaccine (MMR) 1,000-12,500 TCID50/0.5 mL injection 0.5 mL
1000-12500 | SUBCUTANEOUS | Status: AC
Start: 2015-07-09 — End: 2015-07-11
  Administered 2015-07-11: 19:00:00 via SUBCUTANEOUS

## 2015-07-09 MED ORDER — hydrOXYzine HCl (ATARAX) tablet 100 mg
50 | Freq: Every evening | ORAL | Status: DC | PRN
Start: 2015-07-09 — End: 2015-07-09
  Administered 2015-07-09: 07:00:00 50 mg via ORAL

## 2015-07-09 MED ORDER — fentanyl with bupivacaine (PF) 2 mcg/mL- 0.1 % epidural
2 | INTRAMUSCULAR | Status: AC
Start: 2015-07-09 — End: 2015-07-09
  Administered 2015-07-09: 20:00:00 2 mcg/mL- 0.1 %

## 2015-07-09 MED ORDER — lidocaine 10 mg/mL (1 %) injection
10 | INTRAMUSCULAR | Status: DC
Start: 2015-07-09 — End: 2015-07-09

## 2015-07-09 MED ORDER — bupivacaine (PF) (MARCAINE) 0.25 % (2.5 mg/mL) injection
0.25 | INTRAMUSCULAR | Status: DC | PRN
Start: 2015-07-09 — End: 2015-07-09
  Administered 2015-07-09: 20:00:00 0.25 % (2.5 mg/mL) via EPIDURAL

## 2015-07-09 MED ORDER — docusate sodium (COLACE) capsule 100 mg
100 | Freq: Two times a day (BID) | ORAL | Status: DC | PRN
Start: 2015-07-09 — End: 2015-07-11

## 2015-07-09 MED ORDER — ondansetron (ZOFRAN-ODT) disintegrating tablet
4 | Freq: Four times a day (QID) | ORAL | Status: DC | PRN
Start: 2015-07-09 — End: 2015-07-11

## 2015-07-09 MED ORDER — fentaNYL (PF) (SUBLIMAZE) 50 mcg/mL injection
50 | INTRAMUSCULAR | Status: DC | PRN
Start: 2015-07-09 — End: 2015-07-09
  Administered 2015-07-09 (×2): 50 mcg/mL via INTRAVENOUS

## 2015-07-09 MED ORDER — fentaNYL (PF) (SUBLIMAZE) 50 mcg/mL injection 100 mcg
50 | INTRAMUSCULAR | Status: DC | PRN
Start: 2015-07-09 — End: 2015-07-09
  Administered 2015-07-09 (×2): 50 ug via INTRAVENOUS

## 2015-07-09 MED ORDER — oxytocin (PITOCIN) 20 Units/1000 mL 0.9 % NS infusion
20 | INTRAVENOUS | Status: DC | PRN
Start: 2015-07-09 — End: 2015-07-09
  Administered 2015-07-09 (×2): 201000 m[IU]/min via INTRAVENOUS
  Administered 2015-07-09: 14:00:00 20 m[IU]/min via INTRAVENOUS
  Administered 2015-07-09 (×4): 201000 m[IU]/min via INTRAVENOUS

## 2015-07-09 MED ORDER — ondansetron (ZOFRAN) injection 4 mg
4 | Freq: Four times a day (QID) | INTRAMUSCULAR | Status: DC | PRN
Start: 2015-07-09 — End: 2015-07-11

## 2015-07-09 MED ORDER — carboprost (HEMABATE) injection 250 mcg
250 | INTRAMUSCULAR | Status: DC | PRN
Start: 2015-07-09 — End: 2015-07-09

## 2015-07-09 MED ORDER — methylergonovine (METHERGINE) injection 200 mcg
0.2 | Freq: Once | INTRAMUSCULAR | Status: DC | PRN
Start: 2015-07-09 — End: 2015-07-09

## 2015-07-09 MED ORDER — lactated ringers bolus 1,000 mL
Freq: Once | INTRAVENOUS | Status: AC
Start: 2015-07-09 — End: 2015-07-08
  Administered 2015-07-09: 04:00:00 via INTRAVENOUS

## 2015-07-09 MED ORDER — fentaNYL (PF) (SUBLIMAZE) 50 mcg/mL injection
50 | INTRAMUSCULAR | Status: AC
Start: 2015-07-09 — End: 2015-07-09
  Administered 2015-07-09: 19:00:00 50 mcg/mL via INTRAVENOUS

## 2015-07-09 MED ORDER — oxytocin (PITOCIN) 20 unit/1000 mL 20 Units/1000 mL 0.9 % NS infusion
20 | INTRAVENOUS | Status: AC
Start: 2015-07-09 — End: 2015-07-09

## 2015-07-09 MED ORDER — methylergonovine (METHERGINE) tablet 200 mcg
0.2 | Freq: Four times a day (QID) | ORAL | Status: DC | PRN
Start: 2015-07-09 — End: 2015-07-09

## 2015-07-09 MED ORDER — miSOPROStol (CYTOTEC) tablet 800 mcg
200 | Freq: Once | ORAL | Status: DC | PRN
Start: 2015-07-09 — End: 2015-07-09

## 2015-07-09 MED ORDER — fentaNYL (PF) (SUBLIMAZE) 50 mcg/mL injection
50 | INTRAMUSCULAR | Status: DC | PRN
Start: 2015-07-09 — End: 2015-07-09
  Administered 2015-07-09: 20:00:00 50 mcg/mL via EPIDURAL

## 2015-07-09 MED ORDER — bupivacaine (PF) (MARCAINE) 0.25 % (2.5 mg/mL) injection
0.25 | INTRAMUSCULAR | Status: DC | PRN
Start: 2015-07-09 — End: 2015-07-09
  Administered 2015-07-09 (×3): 0.25 % (2.5 mg/mL) via EPIDURAL

## 2015-07-09 MED ORDER — fentaNYL (PF) (SUBLIMAZE) 50 mcg/mL injection
50 | INTRAMUSCULAR | Status: AC
Start: 2015-07-09 — End: 2015-07-09

## 2015-07-09 MED ORDER — ePHEDrine sulfate in NS (PF) 5 mg/mL injection
5 | INTRAVENOUS | Status: DC
Start: 2015-07-09 — End: 2015-07-09

## 2015-07-09 MED ORDER — lanolin anhydrous ointment ointment
TOPICAL | Status: DC | PRN
Start: 2015-07-09 — End: 2015-07-11

## 2015-07-09 MED ORDER — ampicillin (OMNIPEN) 2 g in 100 mL 0.9 % NaCl SNAP IVPB
Freq: Once | INTRAMUSCULAR | Status: AC
Start: 2015-07-09 — End: 2015-07-08
  Administered 2015-07-09: 04:00:00 via INTRAVENOUS

## 2015-07-09 MED ORDER — dinoprostone (CERVIDIL) vaginal suppository 10 mg
10 | Freq: Once | VAGINAL | Status: AC
Start: 2015-07-09 — End: 2015-07-09
  Administered 2015-07-09: 01:00:00 10 mg

## 2015-07-09 MED ORDER — ibuprofen (ADVIL,MOTRIN) tablet 800 mg
800 | Freq: Three times a day (TID) | ORAL | Status: DC
Start: 2015-07-09 — End: 2015-07-11
  Administered 2015-07-10 – 2015-07-11 (×6): 800 mg via ORAL

## 2015-07-09 MED ORDER — ampicillin (OMNIPEN) 1 g in 50 mL 0.9 % NaCl SNAP IVPB
INTRAMUSCULAR | Status: DC
Start: 2015-07-09 — End: 2015-07-09
  Administered 2015-07-09 (×4): via INTRAVENOUS

## 2015-07-09 MED FILL — FENTANYL-BUPIVACAINE-NACL (PF) 2 MCG/ML-0.1 % EPIDURAL PREMIX (CNR AVAILABLE): 2 2 mcg/mL- 0.1 % | INTRAMUSCULAR | Qty: 100

## 2015-07-09 NOTE — Anesthesia Procedure Notes (Signed)
Epidural Block    Start time: 07/09/2015 12:19 PM  End time: 07/09/2015 12:28 PM  Reason for block: procedure for pain and at surgeon's request  Staffing  Anesthesiologist: Taylia Berber  Performed by: anesthesiologist   Preanesthetic Checklist  Completed: patient identified, site marked, surgical consent, pre-op evaluation, timeout performed, IV checked, risks and benefits discussed and monitors and equipment checked  Epidural  Patient position: sitting  Prep: Betadine  Patient monitoring: continuous pulse ox and frequent blood pressure checks  Approach: midline  Location: lumbar (1-5)  Injection technique: LOR air  Needle  Needle gauge: 17 G  Needle insertion depth: 6 cm  Catheter at skin depth: 10 cm  Test dose: lidocaine 1.5% with epinephrine 1-to-200,000 and negative (at 1227)  Assessment  Sensory level: T10  No complications noted.  Patient Comfortable.  Full Eavluation Pending.    Block Success: successful block.    Additional Notes  Easy placement.  No csf no heme.

## 2015-07-09 NOTE — Progress Notes (Signed)
Name: Rachel [name]     Birthdate: [birth year]  MRN: [id number]    Labour Progress Note  Subjective     HPI  Rachel [name] rates her discomfort with contractions as mild to moderate. Very uncomfortable with cervical exam but would like to wait on epidural.     Objective     Vitals  BP: 120/76  Pulse: 83  Resp: 18  Temp: 36.2 ?C (97.2 ?F)  SpO2: 96 % (04/27 1600-04/28 0915)    FHR  Baseline: 130 bpm  Variability: moderate  Accelerations: present  Decelerations: absent  Category: 1    Toco  Contractions: 3-4 min  Pitocin: will be 10 next    Cervix  2/ 50 / -3  By RN    Assessment / Plan     Pt is a 30 y.o. [id number] female at [redacted]w[redacted]d here for induction of labor.     1. Continue pitocin  2. Per Dr Laural Benes, okay for epidural anytime   3. Anticipate SVD  4. Continue present management    Shelbie Ammons, MD  07/09/2015, 9:42 AM

## 2015-07-09 NOTE — Anesthesia Pre-Procedure Evaluation (Signed)
Anesthesia Evaluation     Patient summary reviewed and Nursing notes reviewed    No history of anesthetic complications   Airway   Mallampati: II  TM distance: > 8 cmNo Facial Hair  no TMJ problem    Dental    (+) good dentition    Pulmonary     breath sounds clear to auscultation  (+) asthma,   Cardiovascular - negative ROS  Exercise tolerance: good (4-7 METS)    NYHA Classification: II  Rhythm: regular  Rate: normal    Neuro/Psych    (+) headaches,     GI/Hepatic/Renal    (+) GERD,     Endo/Other - negative ROS     Comments: Nl health and nl pregancy   Musculoskeletal - negative ROS                   Anesthesia Plan    ASA 2     epidural     Anesthetic Plan, Alternatives, Risks and Questions discussed with patient.    Pre-Anesthesia Evaluation Completed at:  07/09/2015 12:10 PM

## 2015-07-09 NOTE — Progress Notes (Signed)
Name: Rachel [name]     Birthdate: 07-07-1985  MRN: [id number]    Labour Progress Note  Subjective     HPI  Rachel [name] rates her discomfort with contractions as none. Not feeling contractions.     Objective     Vitals  BP: 112/71  Pulse: 82  Resp: 16  Temp: 36.9 ?C (98.4 ?F)  SpO2: 96 % (04/27 1600)    FHR  Baseline: 130 bpm  Variability: moderate  Accelerations: present  Decelerations: absent  Category: 1    Toco  Contractions: none  Pitocin:     Cervix  Fingertip / 50 / -3 at 0545. (external os ~3cm.Marland Kitchen)    Assessment / Plan     Pt is a 30 y.o. [id number] female at [redacted]w[redacted]d here for induction of labor.     1. Making slow change  2. Will discuss with Dr. Laural Benes re: more cervical ripening vs. pitocin  3. Anticipate SVD  4. Continue present management    Rachel Sauer, MD  07/09/2015, 5:53 AM

## 2015-07-09 NOTE — Anesthesia Post-Procedure Evaluation (Signed)
Patient Name: Rachel [name]  Procedures performed: * No procedures listed *    Last Vitals:   Vitals:    07/09/15 1825   BP:    Pulse:    Resp: 18   Temp:    SpO2:        Planned Anesthesia Type: epidural  Final Anesthesia Type: epidural  Patients Current Location: Labor and Delivery  Level of Consciousness: awake, alert and responds appropriately  no planned opioid use   Post Procedure Pain:adequate analgesia  Airway: Patent  Respiratory Status: room air  Hydration: adequately hydrated  PONV? NO  no anesthesia complication,       The patient was able to participate in the post op evaluation, and has recovered from regional anesthetic    Comments:Epidural site is clean dry with out erythema.  There is some bruising.  No apparent anesthetic complications      Ross Marcus  7:01 PM

## 2015-07-10 LAB — CBC WITHOUT DIFFERENTIAL
HCT: 34.4 % — ABNORMAL LOW (ref 35.0–48.0)
Hemoglobin: 11.7 g/dL (ref 11.7–16.5)
MCH: 29.1 pg (ref 28.3–33.3)
MCHC: 33.9 g/dL (ref 32.5–36.0)
MCV: 85.9 fL (ref 81.0–100.0)
MPV: 8.8 fL (ref 6.9–10.0)
Platelet Count: 116 10*3/ÂµL — ABNORMAL LOW (ref 150–405)
RBC: 4.01 10*6/ÂµL (ref 3.80–5.60)
RDW: 14 % (ref 11.7–16.1)
WBC: 11.9 10*3/ÂµL — ABNORMAL HIGH (ref 4.5–11.0)

## 2015-07-10 NOTE — Plan of Care (Signed)
Problem: Knowledge Deficit  Goal: Patient/family/caregiver demonstrates understanding of disease process, treatment plan, medications, and discharge instructions  Complete learning assessment and assess knowledge base.   Outcome: Progressing  POC discussed with pt. Pt independent with self care    Problem: Pain  Goal: Patient's pain/discomfort is manageable  Assess and monitor patient's pain using appropriate pain scale. Collaborate with interdisciplinary team and initiate plan and interventions as ordered. Re-assess patient's pain level 30 - 60 minutes after pain management intervention.   Outcome: Progressing  Pt pain well managed with scheduled Ibprofen every 8 hours. Pt not taking anything else for pain    Problem: Vaginal Delivery - Recovery and Postpartum  Goal: Patient vitals and physical assessment findings are stable following delivery  Edema will be absent or minimal   Outcome: Completed Date Met:  07/10/15  Pt vital signs have been stable and WNL since delivery  Goal: Perineum intact without discharge or hematoma  Outcome: Completed Date Met:  07/10/15  Perineum is intact with no hematoma or discharge  Goal: Patient will resume normal bowel function  Outcome: Completed Date Met:  07/10/15  Pt has resumed normal bowel function    Problem: Breast Feeding  Goal: Breasts are soft with nipple integrity intact  Outcome: Progressing  Breast are soft and nipple integrity is intact. Pt using lanolin cream and pumping  Goal: Effective breast feeding established  Mother is able to demonstrate proper infant positioning and alignment to promote adequate latching and breastfeeding.   Outcome: Completed Date Met:  07/10/15  Effective breastfeeding has been established    Problem: Parenting/Coping  Goal: Patient/family/caregiver demonstrates positive interactions and parenting skills with baby  Parent/caregiver provides routine infant care, is engaged and aware of infant cues/behaviors and stimulates cognitive, emotional  and social development. Parent/caregiver empathizes and interacts positively with child and demonstrates positive self esteem.   Outcome: Progressing  Pt bonding well with newborn and responding appropriately to newborn needs. Pt instructed to call RN with any questions, concerns, needs, or assistance

## 2015-07-10 NOTE — Progress Notes (Signed)
Name: Rachel [name]  Birthdate: Jan 02, 1986  MRN: [id number]    Progress Note - Vaginal Delivery  Subjective     Admission  07/08/2015  3:48 PM  Hospital Day: 3  1 day  postpartum    HPI  Jacquelyne is a 30 y.o., now [id number] who is 1 day  postpartum from NSVD at [redacted]w[redacted]d.   Her pain is well controlled with ibuprofen alone. Her lochia is decreasing. She is ambulating within the room and eating regular meals.   She feels latching is a challenge, plans to try pumping and syringe feeding. Okay with using formula if she does not have any milk yet.    Review of Systems  ? Positive for minimal and improving lochia, normal appetite, ambulation  ? Negative for headache, vision changes, chest pain, shortness of breath, RUQ pain    Objective     Vitals  BP: 114/59  Pulse: 82  Resp: 18  Temp: 36.7 ?C (98.1 ?F)  SpO2: 96 % (04/27 1600-04/29 0311)     Physical Examination  General: alert, appears stated age and cooperative  Skin: normal  Lungs: normal WOB  Abdomen: soft, appropriate tenderness; bowel sounds normal  Fundus: firm at below the umbilicus  Extremities: no calf pain, no edema    Labs    Results from last 7 days  Lab Units 07/08/15  2102   WBC 10*3/?L 11.7*   HGB g/dL 91.4   MCV fL 78.2   PLATELETS 10*3/?L 115*         Assessment / Plan     Marionna Gonia is a 30 y.o. now [id number] who is 1 day  postpartum s/p NSVD.     ? Recovering well - CBC pending  ? Rubella non immune - MMR ordered  ? Continue lactation support  ? Anticipate discharge at 24 hrs    Shelbie Ammons, MD  07/10/2015, 4:40 AM

## 2015-07-10 NOTE — Plan of Care (Signed)
Problem: Knowledge Deficit  Goal: Patient/family/caregiver demonstrates understanding of disease process, treatment plan, medications, and discharge instructions  Complete learning assessment and assess knowledge base.   Outcome: Progressing  Pt asking appropriate questions and demonstrating and understanding of POC. Continue to assess knowledge and educate as necessary. Provide discharge instructions.         Problem: Pain  Goal: Patient's pain/discomfort is manageable  Assess and monitor patient's pain using appropriate pain scale. Collaborate with interdisciplinary team and initiate plan and interventions as ordered. Re-assess patient's pain level 30 - 60 minutes after pain management intervention.   Outcome: Progressing  Pt pain manageable with PO ibuprofen medication, continue to assess.         Problem: Vaginal Delivery - Recovery and Postpartum  Goal: Patient vitals and physical assessment findings are stable following delivery  Edema will be absent or minimal   Outcome: Progressing  Pt VS stable, continue to assess.          Goal: Perineum intact without discharge or hematoma  Outcome: Progressing  Pt perineum intact without hematoma. Continue to assess.      Goal: Patient will resume normal bowel function  Outcome: Progressing  Pt has active bowel tones. Bowel function considered normal for recovery. Continue to assess and implement relieving factors as necessary.        Problem: Breast Feeding  Goal: Breasts are soft with nipple integrity intact  Outcome: Progressing  Pt displays no s/s of nipple pain or discomfort. Infant exhibits adequate latch. Continue to monitor.      Goal: Effective breast feeding established  Mother is able to demonstrate proper infant positioning and alignment to promote adequate latching and breastfeeding.   Outcome: Progressing  Pt working on effective breastfeeding pattern. Pt is breast feeding, pumping, and supplementing with formula, continue to assess.    Problem:  Parenting/Coping  Goal: Patient/family/caregiver demonstrates positive interactions and parenting skills with baby  Parent/caregiver provides routine infant care, is engaged and aware of infant cues/behaviors and stimulates cognitive, emotional and social development. Parent/caregiver empathizes and interacts positively with child and demonstrates positive self esteem.   Outcome: Progressing  Infant parent's demonstrate positive coping and bonding skills. Continue to assess.

## 2015-07-11 MED ORDER — oxyCODONE-acetaminophen (PERCOCET) 5-325 mg per tablet
5-325 | ORAL_TABLET | Freq: Four times a day (QID) | ORAL | 0 refills | 28.00000 days | Status: AC | PRN
Start: 2015-07-11 — End: 2015-07-21

## 2015-07-11 MED ORDER — ibuprofen (ADVIL,MOTRIN) 800 MG tablet
800 | ORAL_TABLET | Freq: Three times a day (TID) | ORAL | 0 refills | 10.00000 days | Status: AC | PRN
Start: 2015-07-11 — End: 2015-07-21

## 2015-07-11 MED ORDER — ondansetron (ZOFRAN) injection 4 mg
4 | Freq: Once | INTRAMUSCULAR | Status: AC
Start: 2015-07-11 — End: 2015-07-10
  Administered 2015-07-11: 02:00:00 4 mg via INTRAVENOUS

## 2015-07-11 MED ORDER — HYDROcodone-acetaminophen (NORCO) 5-325 mg per tablet 1 tablet
5-325 | ORAL | Status: DC | PRN
Start: 2015-07-11 — End: 2015-07-11
  Administered 2015-07-11 (×2): 5-325 via ORAL

## 2015-07-11 NOTE — Progress Notes (Signed)
Name: Rachel [name]  Birthdate: 06-01-1985  MRN: [id number]    Progress Note - Vaginal Delivery  Subjective     Admission  07/08/2015  3:48 PM  Hospital Day: 4  2 days  postpartum    HPI  Rachel [name] is a 30 y.o., now [id number] who is 2 days  postpartum from NSVD at [redacted]w[redacted]d.   Her pain is well controlled with ibuprofen and single dose of norco last night. Her lochia is decreasing. She is ambulating within the room and eating regular meals. No chest pain or trouble breathing.    She feels latching is a challenge, but improving.  Milk is not fully in yet, so is supplementing with formula.     Objective     Vitals  BP: 106/53  Pulse: 70  Resp: 16  Temp: 36.8 ?C (98.2 ?F)  SpO2: 98 % (04/29 1829-04/29 2311)     Physical Examination  General: alert, appears stated age and cooperative  Skin: normal  CV: RRR  Lungs: normal WOB  Abdomen: soft, appropriate tenderness; bowel sounds normal  Fundus: firm at below the umbilicus  Extremities: no calf pain, no edema    Labs    Results from last 7 days  Lab Units 07/10/15  0501 07/08/15  2102   WBC 10*3/?L 11.9* 11.7*   HGB g/dL 65.7 84.6   MCV fL 96.2 86.2   PLATELETS 10*3/?L 116* 115*         Assessment / Plan     Rachel [name] is a 30 y.o. now [id number] who is 2 days  postpartum s/p NSVD.     ? Recovering well - CBC stable  ? Rubella non immune - MMR ordered  ? Continue lactation support  ? Anticipate discharge later today    Sallye Ober, MD  07/11/2015, 12:30 AM

## 2015-07-11 NOTE — Discharge Summary (Signed)
Name: Rachel [name]     Birthdate: 29-Oct-1985  MRN: [id number]    Discharge Summary - Vaginal Delivery  History     Admission  Admitted: 07/08/2015  3:48 PM  Discharged: 07/11/2015  Hospital Day: 4  2 days  s/p NSVD    Pregnancy Details  Complications: alloimmunization MTA antibodies, h/o prior fetal anemia not requiring transfustion  Breast and Bottle feeding  Social concerns: None    Newborn  Name: Rachel [name]  Gender: female  DOB: [birth year] at 1:39 PM   Gestational Age: [redacted]w[redacted]d  Birth Weight: 3.104 kg (6 lb 13.5 oz)    Apgars (1 and 5 minutes): 9  , 9   Maternal  Name: Rachel [name]  Age: 30 y.o.  G&P s/p delivery: G5P2032  GBS Status: positive  Blood Type: Opositive   Rubella equivocal  Discharge Attending: Linton Rump, MD  PCP: Rubin Payor, MD    Delivery Details  Delivery Indication: IOL   Delivery Method: Vaginal, Spontaneous Delivery    Delivery Position: LOA    Anesthesia:  Epidural  Complications: None [0]    Resuscitation: Dry/Stimulate    Delivering Physician: Marshell Garfinkel T   Maternal Antibiotics: Yes      Labor     Patient presented for cervical ripening on the night prior to delivery and pitocin induction of labor. Labor progressed uneventfully and delivery was uncomplicated. Patient did receive an epidural. Mom is GBS+ but received adequate intrapartum prophylaxis.    Postpartum Course     Her postpartum course was unremarkable.   Her vital signs have been stable and afebrile. Patient is tolerating a regular diet and was ambulating and voiding well.     Review of Systems  ? Positive for minimal and improving lochia  ? Negative for headache, vision changes, chest pain, shortness of breath, RUQ pain    Objective     Vitals  BP: 113/55  Pulse: 76  Resp: 16  Temp: 37.1 ?C (98.8 ?F)  SpO2: 98 % (04/29 1829-04/30 5409)    Physical Examination  Please see physical exam from Dr. Lyla Glassing note on same date of service.    Labs    Results from last 7 days  Lab Units 07/10/15  0501 07/08/15  2102   WBC 10*3/?L  11.9* 11.7*   HGB g/dL 81.1 91.4   MCV fL 78.2 86.2   PLATELETS 10*3/?L 116* 115*         Discharge Meds     Patient's Discharge Medication List      TAKE these medications       Indications of Use    acetaminophen-caff-pyrilamine 500-60-15 mg Tab   Take 2 tablets by mouth as needed (Midol).        CHEWABLE PROBIOTIC ORAL   Take 1 tablet by mouth every morning.        COLON HERBAL CLEANSER Cap   Generic drug:  herbal drugs   Take by mouth.        DHEA 25 mg Cap   Generic drug:  prasterone (dhea)   Take 25 mg by mouth every morning.        FIBER GUMMIES ORAL   Take 1 tablet by mouth every morning.        hydrOXYzine HCl 50 MG tablet   Quantity:  30 tablet   Commonly known as:  ATARAX   Take 1 tablet (50 mg total) by mouth 2 (two) times daily as needed for Anxiety.        *  ibuprofen 200 MG tablet   What changed:  Another medication with the same name was added. Make sure you understand how and when to take each.   Commonly known as:  ADVIL,MOTRIN   Take 800 mg by mouth as needed for Pain.        * ibuprofen 800 MG tablet   Quantity:  50 tablet   What changed:  You were already taking a medication with the same name, and this prescription was added. Make sure you understand how and when to take each.   Commonly known as:  ADVIL,MOTRIN   Take 1 tablet (800 mg total) by mouth every 8 (eight) hours as needed for Pain for up to 10 days.        LINZESS 290 mcg Cap   Generic drug:  linaclotide   Take 290 mcg by mouth daily.        modafinil 200 MG tablet   Commonly known as:  PROVIGIL   Take 200 mg by mouth as needed.        multivitamin per tablet   Commonly known as:  THERAGRAN   Take 1 tablet by mouth every morning.        omeprazole 20 MG capsule   Commonly known as:  PriLOSEC   Take 20 mg by mouth every morning.        oxyCODONE-acetaminophen 5-325 mg per tablet   Quantity:  30 tablet   Commonly known as:  PERCOCET   Take 1-2 tablets by mouth every 6 (six) hours as needed for Pain for up to 10 days.        phentermine  37.5 mg tablet   Commonly known as:  ADIPEX-P   Take 37.5 mg by mouth as needed (takes only 1/2 tablet).        PRENATAL MULTIVITAMINS ORAL   Take by mouth.        * Notice:  This list has 2 medication(s) that are the same as other medications prescribed for you. Read the directions carefully, and ask your doctor or other care provider to review them with you.          Assessment / Plan     Diagnoses  ? Term pregnancy at [redacted]w[redacted]d  ? GBS positive, adequately treated  ? NSVD  Delivery of viable female infant weighing 3.104 kg (6 lb 13.5 oz) , APGARS 9   and 9    ? Lacerations: none    Discharge Disposition  Home with infant on Hospital Day: 4 at 2 days  s/p vaginal delivery.    Discharge Status  Well, stable    Follow-up  Follow up with Dr Laural Benes in 6 weeks.  Patient was given written postpartum instructions in regards to her activity level and limitations.    Veleta Miners, MD  07/11/2015, 9:29 AM

## 2015-07-13 ENCOUNTER — Ambulatory Visit: Admit: 2015-07-13 | Primary: MD

## 2015-07-13 DIAGNOSIS — O9270 Unspecified disorders of lactation: Secondary | ICD-10-CM

## 2015-07-13 NOTE — Lactation Note (Signed)
Lactation Consult    Maternal Assessment  General Health: Healthy  Breastfed Previously: Yes, with problems  Mother's ease with baby: Insecure  Father's Involvement: Very helpful  Breasts: Symmetrical  Breast size: Medium  Increase size: One cup size  Nipples at rest: Erect  Surgery of breasts: None  Let-down reflex: Leaking  Engorgement: Extreme  Nipple damage: Cracks, blisters (Right side only)  Nipple shield/brest shell used: Never    Infant's Assessment  Birth Weight: 6# 13.5 oz  Current weight: 2.948 kg (6 lb 8 oz)  Percentage Loss: -5%  Size: AGA  Weeks gestation: 38-40  Frequency of feedings: 8-12 per 24 houres  Duration of feedings: Less than 15 minutes  Pacifier used: None  Supplementation:  (first 24 hours)  # Voids in past 24 hours: 4-6  # Stools in past 24 hours: 3 or more  Type of stool: Breastmilk stool (yellow/curds)  Jaundice-bilirubin level: Trunk 5-10 (improving, per mother)    Breastfeeding Assessment  Readiness to feed: Alert  Self referral from mother for latch problems secondary to engorgement of right breast. Mother in tears, stating that infant crys and pulls off breast during feeds.  Performed lymph massage and reverse pressure softening to displace fluid in areola. Positioned infant in upright position, prone on mother and shaped breast using c-hold. With these interventions, infant was able to latch and tolerate a forceful MER without becoming agitated. After burping and returning to breast x3, breast softened and mother was able to duplicate this positioning, stating that she is much relieved.  A text message sent 4 hours after this visit confirms mother's confidence.  Encouraged to call with any future concerns.

## 2015-07-15 ENCOUNTER — Encounter: Primary: MD

## 2015-07-15 ENCOUNTER — Encounter: Payer: Commercial Managed Care - PPO | Primary: MD

## 2015-08-06 ENCOUNTER — Emergency Department: Admit: 2015-08-06 | Primary: MD

## 2015-08-06 ENCOUNTER — Emergency Department: Admit: 2015-08-06 | Discharge: 2015-08-11 | Primary: MD

## 2015-08-06 ENCOUNTER — Inpatient Hospital Stay: Admit: 2015-08-06 | Discharge: 2015-08-06 | Disposition: A | Discharge status: Home / Self Care | Attending: DO

## 2015-08-06 DIAGNOSIS — H5702 Anisocoria: Secondary | ICD-10-CM

## 2015-08-06 LAB — POCT CHEM 8
POC Anion Gap: 21 mmol/L — ABNORMAL HIGH (ref 12–20)
POC BUN: 9 mg/dL (ref 6–20)
POC Chloride: 101 mmol/L (ref 101–111)
POC Creatinine: 0.7 mg/dL (ref 0.6–1.3)
POC Glucose: 91 mg/dL (ref 74–106)
POC Hematocrit: 49 %PCV — ABNORMAL HIGH (ref 35–48)
POC Hemoglobin: 16.7 g/dL — ABNORMAL HIGH (ref 11.7–16.5)
POC Ionized Calcium: 4.7 mg/dL (ref 4.5–5.3)
POC Potassium: 3.5 mmol/L (ref 3.5–5.1)
POC Sodium: 143 mmol/L (ref 135–145)
POC Total CO2: 26 mmol/L (ref 22–32)

## 2015-08-06 LAB — CBC WITH AUTO DIFFERENTIAL
Basophils %: 0 % (ref 0–2)
Basophils, Absolute: 0 10*3/ÂµL (ref 0.0–0.1)
Eosinophils %: 1 % (ref 0–5)
Eosinophils, Absolute: 0.1 10*3/ÂµL (ref 0.0–0.4)
HCT: 47.9 % (ref 35.0–48.0)
Hemoglobin: 16.1 g/dL (ref 11.7–16.5)
Lymphocytes %: 28 % (ref 20–40)
Lymphocytes, Absolute: 2.4 10*3/ÂµL (ref 1.0–4.0)
MCH: 29 pg (ref 28.3–33.3)
MCHC: 33.5 g/dL (ref 32.5–36.0)
MCV: 86.5 fL (ref 81.0–100.0)
MPV: 8.6 fL (ref 6.9–10.0)
Monocytes %: 6 % (ref 2–12)
Monocytes, Absolute: 0.5 10*3/??L (ref 0.2–1.0)
Neutrophils %: 65 % (ref 50–74)
Neutrophils, Absolute: 5.6 10*3/??L (ref 2.0–7.4)
Platelet Count: 175 10*3/ÂµL (ref 150–405)
RBC: 5.55 10*6/ÂµL (ref 3.80–5.60)
RDW: 14 % (ref 11.7–16.1)
WBC: 8.6 10*3/??L (ref 4.5–11.0)

## 2015-08-06 LAB — PROTIME-INR
INR: 1 {INR} (ref 0.9–1.1)
Protime: 11.4 s (ref 10.0–13.2)

## 2015-08-06 LAB — BETA-HCG, SERUM, QUALITATIVE: hCG Qualitative (Serum): NEGATIVE

## 2015-08-06 MED ORDER — ondansetron (ZOFRAN) injection 4 mg
4 | INTRAMUSCULAR | Status: DC | PRN
Start: 2015-08-06 — End: 2015-08-06

## 2015-08-06 MED ORDER — gadobutrol Soln 8 mL
10 | Freq: Once | INTRAVENOUS | Status: AC
Start: 2015-08-06 — End: 2015-08-06
  Administered 2015-08-06: 23:00:00 10 mL via INTRAVENOUS

## 2015-08-06 MED ORDER — HYDROmorphone (DILAUDID) syringe 0.5 mg
1 | INTRAMUSCULAR | Status: DC | PRN
Start: 2015-08-06 — End: 2015-08-06
  Administered 2015-08-06: 22:00:00 1 mg via INTRAVENOUS

## 2015-08-06 MED ORDER — sodium chloride (NS) 0.9% bolus 1,000 mL
Freq: Once | INTRAVENOUS | Status: AC
Start: 2015-08-06 — End: 2015-08-06
  Administered 2015-08-06 (×2): via INTRAVENOUS

## 2015-08-06 MED ORDER — iopamidol (ISOVUE-300) 61 % injection 100 mL
300 | Freq: Once | INTRAVENOUS | Status: AC
Start: 2015-08-06 — End: 2015-08-06
  Administered 2015-08-06: 19:00:00 300 mL via INTRAVENOUS

## 2015-08-06 NOTE — ED Provider Notes (Signed)
[facility] EMERGENCY DEPARTMENT VISIT NOTE          Name: Rachel [name]  DOB: [birth year] 30 y.o.  MRN: [id number]  CSN: [id number]    Further care of [name] was signed out to me by Dr. Despina Arias pending MRV.  Please see the original note of the same date for further details.    DATA:       LABS    Results for orders placed or performed during the hospital encounter of 08/06/15 (from the past 24 hour(s))   POC Chem 8 -Next Routine    Collection Time: 08/06/15 12:11 PM   Result Value Ref Range    POC Sodium 143 135 - 145 mmol/L    POC Potassium 3.5 3.5 - 5.1 mmol/L    POC Chloride 101 101 - 111 mmol/L    POC Ionized Calcium 4.7 4.5 - 5.3 mg/dL    POC BUN 9 6 - 20 mg/dL    POC Creatinine 0.7 0.6 - 1.3 mg/dL    POC Hematocrit 49 (H) 35 - 48 %PCV    POC Hemoglobin 16.7 (H) 11.7 - 16.5 g/dL    POC Total CO2 26 22 - 32 mmol/L    POC Anion Gap 21 (H) 12 - 20 mmol/L    POC Glucose 91 74 - 106 mg/dL   Protime Panel -STAT    Collection Time: 08/06/15 12:19 PM   Result Value Ref Range    Protime 11.4 10.0 - 13.2 sec    INR 1.0 0.9 - 1.1 INR   hCG, Qualitative, Serum -STAT    Collection Time: 08/06/15 12:56 PM   Result Value Ref Range    hCG Qualitative (Serum) Negative Negative   CBC with Auto Differential -STAT    Collection Time: 08/06/15 12:57 PM   Result Value Ref Range    WBC 8.6 4.5 - 11.0 10*3/?L    RBC 5.55 3.80 - 5.60 10*6/?L    Hemoglobin 16.1 11.7 - 16.5 g/dL    HCT 29.5 62.1 - 30.8 %    MCV 86.5 81.0 - 100.0 fL    MCH 29.0 28.3 - 33.3 pg    MCHC 33.5 32.5 - 36.0 g/dL    RDW 65.7 84.6 - 96.2 %    Platelet Count 175 150 - 405 10*3/?L    MPV 8.6 6.9 - 10.0 fL    Neutrophils % 65 50 - 74 %    Lymphocytes % 28 20 - 40 %    Monocytes % 6 2 - 12 %    Eosinophils % 1 0 - 5 %    Basophils % 0 0 - 2 %    Neutrophils, Absolute 5.6 2.0 - 7.4 10*3/?L    Lymphocytes, Absolute 2.4 1.0 - 4.0 10*3/?L    Monocytes, Absolute 0.5 0.2 - 1.0 10*3/?L    Eosinophils, Absolute 0.1 0.0 - 0.4 10*3/?L    Basophils, Absolute 0.0 0.0 -  0.1 10*3/?L       Radiology:  CT angiogram neck   Final Result      CT angiogram head   Final Result      CT head without contrast   Final Result      MRI venogram head with and without contrast    (Results Pending)       PLAN:     ED COURSE & MEDICAL DECISION MAKING      ED Course     Her MRV was normal.  I relayed these  findings to the patient.  She'll be discharged from the emergency department and is to follow-up with Dr. Andria Meuse as previously described by Dr. Despina Arias.      Nursing notes reviewed.       FINAL IMPRESSION         SNOMED CT(R)   1. Anisocoria  ANISOCORIA   2. Headache, unspecified headache type  HEADACHE   3. Elevated hemoglobin (CMS/HCC)  INCREASED HEMOGLOBIN         DISPOSITION  The patient is discharged home in stable condition.  She should follow up with Dr. Andria Meuse as soon as possible.  She should return to the emergency department for any worsening of her condition, change in her condition, or any other concerns that she may have.         Lilli Few, MD  08/06/15 1630

## 2015-08-06 NOTE — ED Notes (Signed)
Bed: 503-01  Expected date: 08/06/15  Expected time:   Means of arrival:   Comments:  Rachel [name], Rachel [name]

## 2015-08-06 NOTE — ED Notes (Signed)
Pt with no C/O pain at this time

## 2015-08-06 NOTE — ED Notes (Signed)
Report and care given to George RN

## 2015-08-06 NOTE — Discharge Instructions (Signed)
At this point time I do not know the exact cause for your abnormal symptoms based on your lab and CT/MRV results.      Admission to the hospital was discussed, offered, and declined today. You understood that I do not know the exact cause for your symptoms. You understood there was a possibility you could leave the ER/hospital and have worsening symptoms and or some other adverse event or condition resulting in permanent disability and/or death.  I feel you are competent with capacity to make your choice to go home. I feel you understand the risks of going home. Please be rechecked by your physician or any other physician of your choice as soon as possible. Please return to the emergency department at any point in time for further evaluation and treatment. If your symptoms return/worsen please return to the ER.    Dr. Andria Meuse would like to see you in his office today. Please go directly to his office.    We did discuss performing a lumbar puncture to rule out a life-threatening/disabling causes for headaches and this was declined.    Today your hemoglobin and hematocrit were mildly elevated. Please have this rechecked and followed by your doctor to make sure no pathological process is contributing to this finding.    Please be rechecked by Dr. Andria Meuse today. Please follow up with your doctor as soon as possible for reevaluation. Return the ER with continued or any additional abnormal symptoms.        General Headache Without Cause  A headache is pain or discomfort felt around the head or neck area. The specific cause of a headache may not be found. There are many causes and types of headaches. A few common ones are:  ? Tension headaches.  ? Migraine headaches.  ? Cluster headaches.  ? Chronic daily headaches.  HOME CARE INSTRUCTIONS   Watch your condition for any changes. Take these steps to help with your condition:  Managing Pain  ? Take over-the-counter and prescription medicines only as told by your health care  provider.  ? Lie down in a dark, quiet room when you have a headache.  ? If directed, apply ice to the head and neck area:  ? Put ice in a plastic bag.  ? Place a towel between your skin and the bag.  ? Leave the ice on for 20 minutes, 2-3 times per day.  ? Use a heating pad or hot shower to apply heat to the head and neck area as told by your health care provider.  ? Keep lights dim if bright lights bother you or make your headaches worse.  Eating and Drinking  ? Eat meals on a regular schedule.  ? Limit alcohol use.  ? Decrease the amount of caffeine you drink, or stop drinking caffeine.  General Instructions  ? Keep all follow-up visits as told by your health care provider. This is important.  ? Keep a headache journal to help find out what may trigger your headaches. For example, write down:    What you eat and drink.    How much sleep you get.    Any change to your diet or medicines.  ? Try massage or other relaxation techniques.  ? Limit stress.  ? Sit up straight, and do not tense your muscles.  ? Do not use tobacco products, including cigarettes, chewing tobacco, or e-cigarettes. If you need help quitting, ask your health care provider.  ? Exercise regularly as told by your  health care provider.  ? Sleep on a regular schedule. Get 7-9 hours of sleep, or the amount recommended by your health care provider.  SEEK MEDICAL CARE IF:   ? Your symptoms are not helped by medicine.  ? You have a headache that is different from the usual headache.  ? You have nausea or you vomit.  ? You have a fever.  SEEK IMMEDIATE MEDICAL CARE IF:   ? Your headache becomes severe.  ? You have repeated vomiting.  ? You have a stiff neck.  ? You have a loss of vision.  ? You have problems with speech.  ? You have pain in the eye or ear.  ? You have muscular weakness or loss of muscle control.  ? You lose your balance or have trouble walking.  ? You feel faint or pass out.  ? You have confusion.     This information is not intended to  replace advice given to you by your health care provider. Make sure you discuss any questions you have with your health care provider.     Document Released: 02/27/2005 Document Revised: 11/18/2014 Document Reviewed: 06/22/2014  Elsevier Interactive Patient Education ?2016 [employer]    Blurred Vision  Having blurred vision means that you cannot see things clearly. Your vision may seem fuzzy or out of focus. Blurred vision is a very common symptom of an eye or vision problem. Blurred vision is often a gradual blur that occurs in one eye or both eyes. There are many causes of blurred vision, including cataracts, macular degeneration, and diabetic retinopathy.  Blurred vision can be diagnosed based on your symptoms and a physical exam. Tell your health care provider about any other health problems you have, any recent eye injury, and any prior surgeries. You may need to see a health care provider who specializes in eye problems (ophthalmologist). Your treatment depends on what is causing your blurred vision.   HOME CARE INSTRUCTIONS  ? Tell your health care provider about any changes in your blurred vision.  ? Do not drive or operate heavy machinery if your vision is blurry.  ? Keep all follow-up visits as directed by your health care provider. This is important.  SEEK MEDICAL CARE IF:  ? Your symptoms get worse.  ? You have new symptoms.  ? You have trouble seeing at night.  ? You have trouble seeing up close or far away.  ? You have trouble noticing the difference between colors.  SEEK IMMEDIATE MEDICAL CARE IF:  ? You have severe eye pain.  ? You have a severe headache.  ? You have flashing lights in your field of vision.  ? You have a sudden change in vision.  ? You have a sudden loss of vision.  ? You have vision change after an injury.  ? You notice drainage coming from your eyes.  ? You notice a rash around your eyes.     This information is not intended to replace advice given to you by your health care  provider. Make sure you discuss any questions you have with your health care provider.     Document Released: 03/02/2003 Document Revised: 07/14/2014 Document Reviewed: 01/21/2014  Elsevier Interactive Patient Education ?2016 [employer]    CT angiogram neck   Final Result   PROCEDURE: CT ANGIOGRAM NECK       CONTRAST/RADIOPHARM: NONIONIC CONTRAST       COMPARISON: None.       INDICATIONS: Headache with  anisocoria       TECHNIQUE: After obtaining the patient's consent, CT images of the    neck were obtained with nonionic intravenous contrast. Multiplanar    and 3D reconstructions were created on the workstation for optimal    evaluation of vascular anatomy. The source images were also reviewed.    Automated exposure control based on patient size was utilized as a    dose reduction technique.       FINDINGS:    RIGHT INTERNAL CAROTID: No significant stenosis.   RIGHT EXTERNAL CAROTID: No significant stenosis.   RIGHT COMMON CAROTID: No significant stenosis.    RIGHT VERTEBRAL: No significant stenosis.       LEFT INTERNAL CAROTID: No significant stenosis.    LEFT EXTERNAL CAROTID: No significant stenosis.   LEFT COMMON CAROTID: No significant stenosis.    LEFT VERTEBRAL: No significant stenosis.       OTHER: None.       CONCLUSION: No significant stenosis is seen.                                Report Dictated and Electronically Signed By:  Hoy Finlay on    08/06/2015 at 13:08               CT angiogram head   Final Result   PROCEDURE: CT ANGIOGRAM HEAD        CONTRAST/RADIOPHARM: NONIONIC CONTRAST       COMPARISON: None.       INDICATIONS: Headache with anisocoria       TECHNIQUE: After obtaining the patient's consent, CT images were    obtained of the Circle of Willis and other intracranial arteries    after administration of intravenous nonionic contrast.  Axial source    images were reviewed. Multiplanar and 3D reconstructions were created    on the workstation for optimal evaluation of vascular anatomy.     Automated exposure control based on patient size was utilized as a    dose reduction technique.       FINDINGS:    STENOSES: None.   ANEURYSMS: None.   DISSECTIONS: None identified.   ANATOMY: The posterior cerebral arteries arise from the basilar    artery.   OTHER: None.       CONCLUSION: No acute intracranial occlusions are seen. No visible    aneurysms.                              Report Dictated and Electronically Signed By:  Hoy Finlay on    08/06/2015 at 13:04               CT head without contrast   Final Result   PROCEDURE: CT HEAD WO CONTRAST       COMPARISON: [facility], CT, HEAD CT W/O CONTRAST [id number],    10/02/2010, 14:36.       INDICATIONS: headache, anisocoria       TECHNIQUE: CT images were obtained without intravenous contrast.    Automated exposure control based on patient size was utilized as a    dose reduction technique.       FINDINGS:        BRAIN: There is normal gray/white matter differentiation.  There is    no mass effect or midline shift.  The basal cisterns are widely    patent.  HEMORRHAGE: No evidence of acute hemorrhage or significant    extra-axial fluid collections.       VENTRICLES: No hydrocephalus.       CALVARIUM: No fractures or osseous lesions are seen.       VISUALIZED SINUSES: Normally pneumatized.        OTHER: None.       CONCLUSION: No evidence of acute intracranial pathology.                                Report Dictated and Electronically Signed By:  Hoy Finlay on    08/06/2015 at 12:32               MRI venogram head with and without contrast    (Results Pending)

## 2015-08-06 NOTE — ED Notes (Signed)
Vital signs stable. 

## 2015-08-06 NOTE — ED Notes (Signed)
Patient off floor to CT.

## 2015-08-06 NOTE — ED Triage Notes (Signed)
30 year old female presented to the ED ambulatory with complaints of left sided headache and a dialated left eye with blurred vision x 45 minutes ago.
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
## 2015-08-06 NOTE — ED Notes (Signed)
Family at bedside. 

## 2015-11-05 NOTE — Telephone Encounter (Signed)
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 I was unable to find a time any earlier than the 11/02/2015 with out using a 10 minutes slot. If you can get her in sooner please call and leave a message with time available since she will be working. thank you

## 2015-11-05 NOTE — Telephone Encounter (Signed)
Set her up for appt lm

## 2015-11-18 NOTE — Telephone Encounter (Signed)
Future Appointments  Date Time Provider Department Center   11/22/2015 8:50 AM Rubin Payor, MD [facility]   Per Call Rachel [name] needs to reschedule her appointment on 11/22/15. Please call to reschedule.

## 2015-11-18 NOTE — Telephone Encounter (Signed)
I have tried calling Rachel [name] but her voice mail is full so unable to leave a message for her to call,   She is needing to r/s her appt   lm

## 2015-11-19 ENCOUNTER — Encounter: Attending: MD | Primary: MD

## 2015-11-22 ENCOUNTER — Encounter: Attending: MD | Primary: MD

## 2015-12-02 ENCOUNTER — Ambulatory Visit: Admit: 2015-12-02 | Discharge: 2015-12-02 | Attending: MD | Primary: MD

## 2015-12-02 DIAGNOSIS — M659 Synovitis and tenosynovitis, unspecified: Secondary | ICD-10-CM

## 2015-12-02 MED ORDER — meloxicam (MOBIC) 15 MG tablet
15 | ORAL_TABLET | Freq: Every day | ORAL | 1 refills | 30.00000 days | Status: DC
Start: 2015-12-02 — End: 2016-08-30

## 2015-12-02 NOTE — Patient Instructions (Addendum)
Ice the area for 20 min twice daily and take mobic as directed. Let me know if problems with the meds. Wear thumb spica wrist splint as much as you can and let me know if not improving over the next few weeks  Lollie Sails Disease  Lollie Sails disease is inflammation of the tendon on the thumb side of the wrist. Tendons are cords of tissue that connect bones to muscles. The tendons in your hand pass through a tunnel, or sheath. A slippery layer of tissue (synovium) lets the tendons move smoothly in the sheath. With de Quervain disease, the sheath swells or thickens, causing friction and pain.  The condition is also called de Quervain tendinosis and de Quervain syndrome. It occurs most often in women who are 28-8 years old.  CAUSES   The exact cause of de Quervain disease is not known. It may result from:   ? Overusing your hands, especially with repetitive motions that involve twisting your hand or using a forceful grip.  ? Pregnancy.  ? Rheumatoid disease.  RISK FACTORS  You may have a greater risk for de Quervain disease if you:  ? Are a middle-aged woman.  ? Are pregnant.  ? Have rheumatoid arthritis.  ? Have diabetes.  ? Use your hands far more than normal, especially with a tight grip or excessive twisting.  SIGNS AND SYMPTOMS  Pain on the thumb side of your wrist is the main symptom of de Quervain disease. Other signs and symptoms include:  ? Pain that gets worse when you grasp something or turn your wrist.  ? Pain that extends up the forearm.  ? Cysts in the area of the pain.  ? Swelling of your wrist and hand.  ? A sensation of snapping in the wrist.  ? Trouble moving the thumb and wrist.  DIAGNOSIS   Your health care provider may diagnose de Quervain disease based on your signs and symptoms. A physical exam will also be done. A simple test Lourena Simmonds test) that involves pulling your thumb and wrist to see if this causes pain can help determine whether you have the condition. Sometimes you may need to have  an X-ray.   TREATMENT   Avoiding any activity that causes pain and swelling is the best treatment. Other options include:  ? Wearing a splint.  ? Taking medicine. Anti-inflammatory medicines and corticosteroid injections may reduce inflammation and relieve pain.  ? Having surgery if other treatments do not work.  HOME CARE INSTRUCTIONS   ? Using ice can be helpful after doing activities that involve the sore wrist. To apply ice to the injured area:    Put ice in a plastic bag.    Place a towel between your skin and the bag.    Leave the ice on for 20 minutes, 2-3 times a day.  ? Take medicines only as directed by your health care provider.  ? Wear your splint as directed. This will allow your hand to rest and heal.  SEEK MEDICAL CARE IF:   ? Your pain medicine does not help. ?  ? Your pain gets worse.  ? You develop new symptoms.  MAKE SURE YOU:   ? Understand these instructions.  ? Will watch your condition.  ? Will get help right away if you are not doing well or get worse.     This information is not intended to replace advice given to you by your health care provider. Make sure you discuss any questions  you have with your health care provider.     Document Released: 11/22/2000 Document Revised: 03/20/2014 Document Reviewed: 07/02/2013  Elsevier Interactive Patient Education ?2017 [employer]
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
## 2015-12-02 NOTE — Progress Notes (Signed)
Her left hand / thumb area gets painful / is constant for awhile lm

## 2016-01-18 ENCOUNTER — Other Ambulatory Visit: Admit: 2016-01-18 | Primary: MD

## 2016-01-18 DIAGNOSIS — E282 Polycystic ovarian syndrome: Secondary | ICD-10-CM

## 2016-01-18 LAB — CBC WITH AUTO DIFFERENTIAL
Basophils %: 0 % (ref 0–2)
Basophils, Absolute: 0 10*3/ÂµL (ref 0.0–0.1)
Eosinophils %: 2 % (ref 0–5)
Eosinophils, Absolute: 0.2 10*3/ÂµL (ref 0.0–0.4)
HCT: 45.1 % (ref 35.0–48.0)
Hemoglobin: 15.4 g/dL (ref 11.7–16.5)
Lymphocytes %: 24 % (ref 20–40)
Lymphocytes, Absolute: 1.9 10*3/ÂµL (ref 1.0–4.0)
MCH: 29.8 pg (ref 28.3–33.3)
MCHC: 34.1 g/dL (ref 32.5–36.0)
MCV: 87.6 fL (ref 81.0–100.0)
MPV: 9.2 fL (ref 6.9–10.0)
Monocytes %: 8 % (ref 2–12)
Monocytes, Absolute: 0.6 10*3/ÂµL (ref 0.2–1.0)
Neutrophils %: 67 % (ref 50–74)
Neutrophils, Absolute: 5.4 10*3/??L (ref 2.0–7.4)
Platelet Count: 179 10*3/ÂµL (ref 150–405)
RBC: 5.14 10*6/??L (ref 3.80–5.60)
RDW: 12.2 % (ref 11.7–16.1)
WBC: 8.1 10*3/??L (ref 4.5–11.0)

## 2016-01-18 LAB — COMPREHENSIVE METABOLIC PANEL
ALT - Alanine Aminotransferase: 17 U/L (ref 5–33)
AST - Aspartate Aminotransferase: 21 U/L (ref 5–32)
Albumin/Globulin Ratio: 1.3 (ref 1.2–2.2)
Albumin: 4.4 g/dL (ref 3.5–5.0)
Alkaline Phosphatase: 63 U/L (ref 35–105)
Anion Gap: 18 mmol/L (ref 12.0–20.0)
BUN / Creatinine Ratio: 19.3 (ref 12.0–20.0)
BUN: 11 mg/dL (ref 8–20)
Bilirubin Total: 0.5 mg/dL (ref 0.10–1.70)
CO2 - Carbon Dioxide: 26 mmol/L (ref 17–27)
Calcium: 9.4 mg/dL (ref 8.6–10.6)
Chloride: 101.4 mmol/L (ref 101.0–111.0)
Creatinine: 0.57 mg/dL — ABNORMAL LOW (ref 0.60–1.30)
Glomerular Filtration Rate Estimate (Female): >60 mL/min/{1.73_m2} (ref >=60.0)
Glucose: 73 mg/dL — ABNORMAL LOW (ref 74–106)
Osmolality Calculation: 279 mOsm/kg (ref 275.0–300.0)
Potassium: 4.06 mmol/L (ref 3.50–5.10)
Protein Total: 7.7 g/dL (ref 6.1–7.9)
Sodium: 141 mmol/L (ref 135–145)

## 2016-01-18 LAB — LIPID PANEL
Chol/HDL Ratio: 3.1 (ref 0.0–5.0)
Cholesterol, HDL: 49 mg/dL — ABNORMAL LOW (ref >=60)
Cholesterol: 154 mg/dL (ref <=200)
LDL Calculated: 94 mg/dL (ref 0–129)
Triglyceride: 57 mg/dL (ref <=150.0)
VLDL Cholesterol Calculation: 11.4 mg/dL (ref 7.0–32.0)

## 2016-01-18 LAB — GLYCO-HEMOGLOBIN A1C: Glycohemoglobin (A1c): 4.8 %

## 2016-04-11 NOTE — Telephone Encounter (Signed)
Patient had been on the rx per last note ? And is wanting to restart ,  llm

## 2016-04-11 NOTE — Telephone Encounter (Signed)
Medication: Modafinil     Dose: 200 mg tab    Quantity: unknown  Directions: unknown  Last filled by in Epic:unknown  Refills:    Date last filled: patient says in 2016  Pharmacy:    Pharmacy Notes:     Office Note: Can not find Prescription in her chart. She has half a bottle left. She stopped Prescription due to pregnancy and started back up again.     Last Completed Office Visit: 12/02/2015    No future appointments.  *Note: Future appointments [facility] includes other departments.

## 2016-04-13 MED ORDER — modafinil (PROVIGIL) 200 MG tablet
200 | ORAL_TABLET | Freq: Every day | ORAL | 2 refills | Status: DC
Start: 2016-04-13 — End: 2016-04-18

## 2016-04-17 NOTE — Telephone Encounter (Signed)
Medication Prior Authorization Required:   Medication: modafinil (PROVIGIL) 200 MG tablet   Insurance: [email or web address]   Incoming PA Form From [email or web address]   ID #: [id number]    Form to MA to Process

## 2016-04-18 NOTE — Telephone Encounter (Signed)
Am working on getting the PA going , will fax the pa form when signed by dr Doristine Counter lm

## 2016-04-19 MED ORDER — modafinil (PROVIGIL) 200 MG tablet
200 | ORAL_TABLET | Freq: Every day | ORAL | 2 refills | Status: DC
Start: 2016-04-19 — End: 2016-08-30

## 2016-04-19 NOTE — Telephone Encounter (Signed)
Medication Prior Authorization Required:   Medication: modafinil (PROVIGIL) 200 MG tablet   Insurance: [email or web address]   Incoming PA Form From [email or web address]   ID #: [id number]    Form to MA to Process

## 2016-08-30 ENCOUNTER — Ambulatory Visit: Admit: 2016-08-30 | Discharge: 2016-08-31 | Attending: PA | Primary: MD

## 2016-08-30 ENCOUNTER — Other Ambulatory Visit: Admit: 2016-08-31 | Discharge: 2016-08-31 | Primary: MD

## 2016-08-30 DIAGNOSIS — R1032 Left lower quadrant pain: Secondary | ICD-10-CM
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
## 2016-08-30 NOTE — Progress Notes (Signed)
Rachel [name] is a 31 y.o. female here for LLQ pain x 1.5 weeks.

## 2016-08-31 ENCOUNTER — Inpatient Hospital Stay: Admit: 2016-08-31 | Attending: PA | Primary: MD

## 2016-08-31 LAB — COMPREHENSIVE METABOLIC PANEL
ALT - Alanine Aminotransferase: 12 IU/L (ref 5–33)
AST - Aspartate Aminotransferase: 18 IU/L (ref 5–32)
Albumin/Globulin Ratio: 1.4 (ref 1.2–2.2)
Albumin: 4 g/dL (ref 3.5–5.0)
Alkaline Phosphatase: 56 IU/L (ref 35–105)
Anion Gap: 16.1 mmol/L (ref 12.0–20.0)
BUN / Creatinine Ratio: 17.1 (ref 12.0–20.0)
BUN: 12 mg/dL (ref 8–20)
Bilirubin Total: 0.3 mg/dL (ref 0.10–1.70)
CO2 - Carbon Dioxide: 26 mmol/L (ref 17–27)
Calcium: 9.1 mg/dL (ref 8.6–10.6)
Chloride: 101.8 mmol/L (ref 101.0–111.0)
Creatinine: 0.7 mg/dL (ref 0.60–1.30)
Glomerular Filtration Rate Estimate (Female): >60 mL/min/{1.73_m2} (ref >=60.0)
Glucose: 93 mg/dL (ref 74–106)
Osmolality Calculation: 278.9 mOsm/kg (ref 275.0–300.0)
Potassium: 3.9 mmol/L (ref 3.50–5.10)
Protein Total: 6.9 g/dL (ref 6.4–8.2)
Sodium: 140 mmol/L (ref 135–145)

## 2016-08-31 LAB — CBC WITH AUTO DIFFERENTIAL
HCT: 39.3 % (ref 35.0–48.0)
Hemoglobin: 13.8 g/dL (ref 11.7–16.5)
Lymphocytes %: 34 % (ref 20–40)
Lymphocytes, Absolute: 2.3 10*3/uL (ref 1.0–4.0)
MCH: 30.5 pg (ref 28.3–33.3)
MCHC: 35.1 g/dL (ref 32.5–36.0)
MCV: 86.9 fL (ref 81.0–100.0)
MPV: 8.5 fL (ref 6.9–10.0)
Mixed %: 5.1 % (ref 1.3–14.7)
Mixed, Absolute: 0.3 10*3/uL (ref 0.1–1.5)
Neutrophils %: 61 % (ref 50–74)
Neutrophils, Absolute: 4 10*3/uL (ref 2.0–7.4)
Platelet Count: 207 10*3/uL (ref 150–405)
RBC: 4.52 ma/uL (ref 3.80–5.60)
RDW: 11.2 % — ABNORMAL LOW (ref 11.7–16.1)
WBC: 6.6 10*3/uL (ref 4.5–11.0)

## 2016-08-31 LAB — BETA-HCG, SERUM, QUALITATIVE: hCG Qualitative (Serum): NEGATIVE

## 2016-08-31 NOTE — Progress Notes (Signed)
CT ordered - continuing abd pain despite conservative treatment.

## 2016-08-31 NOTE — Telephone Encounter (Signed)
Spoke to patient and advised of note below. Patient verbalized understanding. She said that the pain is coming in waves. This morning it was fairly severe. Any other advice for her?

## 2016-08-31 NOTE — Telephone Encounter (Signed)
-----   Message from [place], [place] sent at 08/30/2016  6:45 PM PDT -----  Please let patient know that 1) she is not pregnant, 2) other labs are normal.    Thank you

## 2016-08-31 NOTE — Telephone Encounter (Signed)
Pt would like to know the results of lab and xray when available.    Please call to discuss.

## 2016-08-31 NOTE — Telephone Encounter (Signed)
Left message asking patient to return my call.

## 2016-08-31 NOTE — Telephone Encounter (Signed)
Notes recorded by Signa Kell, PA on 08/31/2016 at 2:27 PM PDT  Please let patient know that the gas pattern on xray is non-obstructive and find out if she has had any improvement in her pain.    Thank you.

## 2016-08-31 NOTE — Telephone Encounter (Signed)
Please let her know I have ordered a CT of abd and pelvis. ASAP - most likely tomorrow.    Thank you.

## 2016-08-31 NOTE — Telephone Encounter (Signed)
Will call patient and advise of note below as well as xray results when Chyrl Civatte sends them to me.

## 2016-09-01 ENCOUNTER — Inpatient Hospital Stay: Admit: 2016-09-01 | Attending: PA | Primary: MD

## 2016-09-01 DIAGNOSIS — R1030 Lower abdominal pain, unspecified: Secondary | ICD-10-CM

## 2016-09-01 MED ORDER — iopamidol (ISOVUE-300) 61 % injection 100 mL
300 | Freq: Once | INTRAVENOUS | Status: AC
Start: 2016-09-01 — End: 2016-09-01
  Administered 2016-09-01: 23:00:00 300 mL via INTRAVENOUS

## 2016-09-01 NOTE — Telephone Encounter (Signed)
No future appointments.    Message to Rachel [name] to call [facility] Scheduling Dept back in regards to the Urgent CT she is needing to have scheduled.  She said she received a call from [facility] Scheduling this morning and was surprised as to why they were calling her.    I read to her Garald Braver message with her full understanding.  She will call [facility] back now.   No need to call patient back at this time.

## 2016-09-04 NOTE — Telephone Encounter (Signed)
-----   Message from [place], [place] sent at 09/04/2016  9:25 AM PDT -----  Please let patient know there was no indication of the cause of her abdominal pain on the CT - do still follow up with GYN.    Thank you.

## 2016-09-04 NOTE — Telephone Encounter (Signed)
Spoke to patient and advised of note below. Patient verbalized understanding and had no further questions.

## 2017-01-01 NOTE — Telephone Encounter (Signed)
Medication: Moadfinil    Dose:200 MG    Pharmacy: [facility]    Sig: Take one tablet by mouth every day. Take one hour    Office Note:     No future appointments.  *Note: Future appointments [facility] includes other departments.

## 2017-01-02 MED ORDER — modafinil (PROVIGIL) 200 MG tablet
200 | ORAL_TABLET | Freq: Every day | ORAL | 2 refills | Status: DC
Start: 2017-01-02 — End: 2018-08-13

## 2017-01-02 NOTE — Telephone Encounter (Signed)
Dr. Wilmon Arms signed, and RX faxed to pharmacy.

## 2017-01-02 NOTE — Telephone Encounter (Signed)
Medication refilled, and up for Dr. Wilmon Arms to sign.

## 2017-01-02 NOTE — Telephone Encounter (Signed)
Pt is requesting refill of Modafinil, last filled on 04/18/16 for #30 with 2 additional refills.    Date of last signed Contract on file: no contract on file  Last UDS done: none  Last OV: 08/30/16 with Signa Kell  Last PDMP ran: none     Routed pended order for review.     No future appointments.     Thank you, Victorino Dike.  Marland Kitchen

## 2017-01-02 NOTE — Telephone Encounter (Signed)
ok 

## 2017-01-02 NOTE — Telephone Encounter (Signed)
Routing to provider for review.

## 2017-01-16 LAB — LIPID PANEL
Chol/HDL Ratio: 3 (ref 0.0–5.0)
Cholesterol, HDL: 62 mg/dL (ref >=60)
Cholesterol: 186 mg/dL (ref <=200)
LDL Calculated: 100 mg/dL (ref 0–129)
Triglyceride: 121 mg/dL (ref <=150.0)
VLDL Cholesterol Calculation: 24.2 mg/dL (ref 7.0–32.0)

## 2017-01-16 LAB — GLYCO-HEMOGLOBIN A1C: Glycohemoglobin (A1c): 4.9 % (ref <=5.6)

## 2017-01-25 NOTE — Telephone Encounter (Signed)
Patient returns call, scheduled per request, states she is very tired, loss of energy/appetite, would like to f/u with prilosec as she was taking this medication then stopped due to pregnancy and recently restarted again as her baby is now 48mo. States she would just like to discuss these things and see what her options are. Hilton Sinclair, LPN      Future Appointments  Date Time Provider Department Center   02/05/2017 11:00 AM Veleta Miners, MD [facility]

## 2017-01-25 NOTE — Telephone Encounter (Signed)
No future appointments.    Rachel [name] calls asking to speak to your office in regards to a Medication Management appointment needed. She says she has other issues she would like to discuss with the doctor also so hopes you will call her back to discuss scheduling.  She also mentioned she would like to see a female doctor , Dr.Gonzales, if at all possible.

## 2017-01-25 NOTE — Telephone Encounter (Signed)
Call to patient per below, changed to Dr Jola Schmidt as PCP, no answer, left message to return call. Rachel Sinclair, LPN

## 2017-02-05 ENCOUNTER — Ambulatory Visit: Admit: 2017-02-05 | Discharge: 2017-02-05 | Attending: MD | Primary: MD

## 2017-02-05 DIAGNOSIS — F320 Major depressive disorder, single episode, mild: Secondary | ICD-10-CM

## 2017-02-05 MED ORDER — ranitidine (ZANTAC) 150 MG tablet
150 | ORAL_TABLET | Freq: Every evening | ORAL | 3 refills | Status: DC
Start: 2017-02-05 — End: 2017-05-13

## 2017-02-05 MED ORDER — busPIRone (BUSPAR) 5 MG tablet
5 | ORAL_TABLET | Freq: Three times a day (TID) | ORAL | 3 refills | Status: DC | PRN
Start: 2017-02-05 — End: 2017-03-26

## 2017-02-05 MED ORDER — buPROPion (WELLBUTRIN) 75 MG tablet
75 | ORAL_TABLET | Freq: Two times a day (BID) | ORAL | 3 refills | 90.00000 days | Status: DC
Start: 2017-02-05 — End: 2017-04-06

## 2017-02-05 NOTE — Progress Notes (Signed)
ESTABLISHED OUTPATIENT VISIT    CC:   Chief Complaint   Patient presents with   . Fatigue       HPI  Rachel [name] is a 31 y.o. female presenting today to discuss fatigue    ? Chronic since she had her baby who is now 18 months  ? No problem sleeping, I could sleep 24 hours a day  ? 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 Tired no matter how much she sleeps, has lost interest in hobbies, my get up and go has gotten up and gone  ? Denies sadness, stress, SI, HI  ? States that she has had panic attacks in the past, normally while she is at home doing nothing, has chest tightness and racing thoughts    Problem list:  Patient Active Problem List   Diagnosis SNOMED CT(R)   . Family history of cancer FAMILY HISTORY OF CANCER   . PCOS (polycystic ovarian syndrome) POLYCYSTIC OVARIES   . Irregular periods/menstrual cycles IRREGULAR PERIODS   . LLQ pain LEFT LOWER QUADRANT PAIN   . Multigravida MULTIGRAVIDA   . 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 Genetic screening GENETIC FINDING   . Chest tightness or pressure TIGHT CHEST   . [redacted] weeks gestation of pregnancy GESTATION PERIOD, 34 WEEKS   . Chest pressure CHEST DISCOMFORT   . 1st degree AV block FIRST DEGREE ATRIOVENTRICULAR BLOCK       Current Medications:  Outpatient Prescriptions Marked as Taking for the 02/05/17 encounter (Office Visit) with Veleta Miners, MD   Medication Sig Dispense Refill   . herbal drugs Cap Take 1 capsule by mouth daily. Colon cleanse     . metFORMIN (GLUCOPHAGE) 1000 MG tablet Take by mouth. 1000mg  in AM and 500mg  in PM     . modafinil (PROVIGIL) 200 MG tablet Take 1 tablet by mouth daily. Take 1 hour prior to start of work shift for shift work sleep disorder. 30 tablet 2   . norgestimate-ethinyl estradiol (SPRINTEC, 28, ORAL) Take by mouth.         Allergies and past medical, family, and  social history were reviewed and updated.    ROS  Bold items indicate positive  Gen: Negative for unintentional weight loss, fevers, night sweats  HEENT: Negative for photophobia, eye discharge, rhinorrhea, ear pain, throat pain  CV: Negative for chest pain, palpitations, leg swelling  Pulm: Negative for wheezing, dyspnea, cough  Abd: Negative for vomiting, diarrhea, hematochezia, melena  Skin: Negative for new rashes  Neuro: Negative for LOC, seizures, new headaches, focal weakness  Psych: per HPI    Physical Exam:  BP 108/62 (BP Location: Left arm, Patient Position: Sitting)   Pulse 72   Temp 37 ?C (98.6 ?F) (Temporal)   Resp 16   Wt 169 lb (76.7 kg)   SpO2 98%   Breastfeeding? No   BMI 28.12 kg/m?     Gen: nontoxic appearing female in NAD, on RA  HEENT: NCAT, EOMI, no conjunctival injection or discharge  Lungs: normal work of breathing  Ext: warm and well perfused, no edema noted bilaterally  Neuro: AOx4, CNII-XII grossly intact, normal gait, otherwise nonfocal  Psych: affect appropriate, mood congruent    Assessment and Plan  Rachel [name] was seen today for fatigue. Symptoms consistent with depression with decreased interest in hobbies, anxiety, fatigue, excessive sleep. Trial of Wellbutrin to avoid sexual  side effects and weight gain. Trial of Buspar for what sound like anxiety attacks. F/u 1 month.    Diagnoses and all orders for this visit:    Gastroesophageal reflux disease, esophagitis presence not specified  -     ranitidine (ZANTAC) 150 MG tablet; Take 1 tablet by mouth every night at bedtime.    Shift work sleep disorder    Current mild episode of major depressive disorder, unspecified whether recurrent (CMS/HCC)  Anxiety  -     buPROPion (WELLBUTRIN) 75 MG tablet; Take 1 tablet by mouth 2 times daily.  -     busPIRone (BUSPAR) 5 MG tablet; Take 1 tablet by mouth 3 times daily as needed for Anxiety.    Future Appointments  Date Time Provider Department Center   02/28/2017 1:40 PM Veleta Miners, MD  [facility]           Veleta Miners, MD

## 2017-02-05 NOTE — Progress Notes (Signed)
Gia Lusher is a 31 y.o. female whom presents today for med management, multiple issues.    BP: 108/62 / Pulse: 72 / SpO2: 98 % / Temp: 37 ?C (98.6 ?F)    Screening:   PHQ-9 Total: 18 Depression Severity Interpretation: Severe Depression (17-27)                           _________________________________________________    Health Maintenance Due   Topic Date Due   . CERVICAL CANCER SCREENING  06/15/2008   . INFLUENZA VACCINE  11/11/2016       _________________________________________________        Vitals:    02/05/17 1116   BP: 108/62   BP Location: Left arm   Patient Position: Sitting   Pulse: 72   Resp: 16   Temp: 37 ?C (98.6 ?F)   TempSrc: Temporal   SpO2: 98%   Weight: 169 lb (76.7 kg)       Ambulatory Nursing Form

## 2017-02-05 NOTE — Patient Instructions (Signed)
?   Switch to ranitidine (Zantac) for reflux, see if this helps - 1 pill nightly  ? For mood, try Wellbutrin (buproprion) - start at 75 mg twice a day, if you are doing well on this dose, we can switch to a long-acting pill that's only once a day  ? For anxiety, try Buspar (busiprone) three times daily as needed

## 2017-02-28 ENCOUNTER — Ambulatory Visit: Admit: 2017-02-28 | Discharge: 2017-03-01 | Attending: MD | Primary: MD

## 2017-02-28 DIAGNOSIS — F320 Major depressive disorder, single episode, mild: Secondary | ICD-10-CM

## 2017-02-28 NOTE — Progress Notes (Signed)
ESTABLISHED OUTPATIENT VISIT    CC:   Chief Complaint   Patient presents with   . Follow-up     depression       HPI  Rachel [name] is a 31 y.o. female presenting today to f/u depression  Last visit late November with me re: same, started Wellbutrin and Buspar PRN    ? Today states feels much better  ? Started exercising again  ? Has noticed that she is not snacking as much  ? Taking Buspar QAM with other meds  ? Does not want to make any dose changes    Problem list:  Patient Active Problem List   Diagnosis SNOMED CT(R)   . Family history of cancer FAMILY HISTORY OF CANCER   . PCOS (polycystic ovarian syndrome) POLYCYSTIC OVARIES   . Irregular periods/menstrual cycles IRREGULAR PERIODS   . LLQ pain LEFT LOWER QUADRANT PAIN   . Multigravida MULTIGRAVIDA   . 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 Genetic screening GENETIC FINDING   . Chest tightness or pressure TIGHT CHEST   . [redacted] weeks gestation of pregnancy GESTATION PERIOD, 34 WEEKS   . Chest pressure CHEST DISCOMFORT   . 1st degree AV block FIRST DEGREE ATRIOVENTRICULAR BLOCK       Current Medications:  Outpatient Prescriptions Marked as Taking for the 02/28/17 encounter (Office Visit) with Veleta Miners, MD   Medication Sig Dispense Refill   . buPROPion (WELLBUTRIN) 75 MG tablet Take 1 tablet by mouth 2 times daily. 60 tablet 3   . busPIRone (BUSPAR) 5 MG tablet Take 1 tablet by mouth 3 times daily as needed for Anxiety. 90 tablet 3   . herbal drugs Cap Take 1 capsule by mouth daily. Colon cleanse     . metFORMIN (GLUCOPHAGE) 1000 MG tablet Take by mouth. 1000mg  in AM and 500mg  in PM     . modafinil (PROVIGIL) 200 MG tablet Take 1 tablet by mouth daily. Take 1 hour prior to start of work shift for shift work sleep disorder. 30 tablet 2   . norgestimate-ethinyl estradiol (SPRINTEC, 28, ORAL) Take by mouth.     . ranitidine  (ZANTAC) 150 MG tablet Take 1 tablet by mouth every night at bedtime. 90 tablet 3       Allergies and past medical, family, and social history were reviewed and updated.    ROS  Bold items indicate positive  Gen: Negative for unintentional weight loss, fevers, night sweats  HEENT: Negative for photophobia, eye discharge, rhinorrhea, ear pain, throat pain  CV: Negative for chest pain, palpitations, leg swelling  Pulm: Negative for wheezing, dyspnea, cough  Abd: Negative for vomiting, diarrhea, hematochezia, melena  Skin: Negative for new rashes  Neuro: Negative for LOC, seizures, new headaches, focal weakness  Psych: improved depression, anxiety per HPI    Physical Exam:  BP 110/60 (BP Location: Left arm, Patient Position: Sitting)   Pulse 72   Temp 36.8 ?C (98.3 ?F) (Temporal)   Wt 170 lb (77.1 kg)   LMP 02/19/2017   SpO2 96%   Breastfeeding? No   BMI 28.29 kg/m?     Gen: nontoxic appearing female in NAD, on RA  HEENT: NCAT, EOMI, no conjunctival injection or discharge  Lungs: normal work of breathing  Ext: warm and well perfused, no edema noted bilaterally  Neuro: AOx4, CNII-XII grossly intact, normal gait, otherwise nonfocal  Psych: affect appropriate, mood congruent  Assessment and Plan  Rachel [name] was seen today for follow-up.    Diagnoses and all orders for this visit:    Current mild episode of major depressive disorder, unspecified whether recurrent (HCC)  Anxiety  - subjective improvement on Wellbutrin 75 mg BID and Buspar 5 mg  - continue current management  - follow up 6 months or sooner PRN    Veleta Miners, MD

## 2017-02-28 NOTE — Progress Notes (Signed)
Rachel [name] is a 31 y.o. female whom presents today for 3 wk f/u.    BP: 110/60 / Pulse: 72 / SpO2: 96 % / Temp: 36.8 ?C (98.3 ?F)      _________________________________________________    Health Maintenance Due   Topic Date Due   . CERVICAL CANCER SCREENING  06/15/2008   . INFLUENZA VACCINE  11/11/2016     Declines flu vaccine today  _________________________________________________        Vitals:    02/28/17 1338   BP: 110/60   BP Location: Left arm   Patient Position: Sitting   Pulse: 72   Temp: 36.8 ?C (98.3 ?F)   TempSrc: Temporal   SpO2: 96%   Weight: 170 lb (77.1 kg)       Ambulatory Nursing Form

## 2017-03-08 NOTE — Telephone Encounter (Signed)
Patient should have multiple refills. [facility] outpatient pharmacy to confirm.     Disregarding request.

## 2017-03-08 NOTE — Telephone Encounter (Signed)
Medication: buPROPion (WELLBUTRIN) 75 MG tablet      Pharmacy: [facility]    Office Note:  Pt requesting more than 30 day supply    No future appointments.  *Note: Future appointments [facility] includes other departments.

## 2017-03-26 MED ORDER — busPIRone (BUSPAR) 5 MG tablet
5 | ORAL_TABLET | Freq: Three times a day (TID) | ORAL | 1 refills | Status: DC | PRN
Start: 2017-03-26 — End: 2018-08-13

## 2017-03-26 NOTE — Telephone Encounter (Signed)
Medication: Bupropion HCL    Dose:75 MG    Pharmacy: [facility]    Sig: Tae one tablet by mouth twice a day     Office Note:   Requesting a 90 day supply instead   No future appointments.  *Note: Future appointments [facility] includes other departments.

## 2017-03-26 NOTE — Telephone Encounter (Signed)
Routed penned order to Dr Gonzales,MD for review. Rhanda Lemire, LPN

## 2017-03-26 NOTE — Addendum Note (Signed)
Addended by: Woodroe Chen on: 03/26/2017 01:51 PM     Modules accepted: Orders

## 2017-03-26 NOTE — Telephone Encounter (Signed)
Patient is requesting a 90 days supply.    Medication pending your approval.    Thank you, Rachel [name]       R/C  Ext [phone]

## 2017-03-31 NOTE — Telephone Encounter (Signed)
Need for Clarification  Medication: buPROPion (WELLBUTRIN) 75 MG tablet   Pharmacy: [facility]  Note to Prescriber: Pt would like 90 day supply. Can we get a new rx Qty of 180.      FAX GIVEN TO OFFICE

## 2017-04-05 NOTE — Telephone Encounter (Signed)
Medication: buPROPion (WELLBUTRIN) 75 MG tablet      Pharmacy: [facility]    Office Note:      No future appointments.  *Note: Future appointments [facility] includes other departments.

## 2017-04-06 MED ORDER — buPROPion (WELLBUTRIN) 75 MG tablet
75 | ORAL_TABLET | Freq: Two times a day (BID) | ORAL | 3 refills | 90.00000 days | Status: DC
Start: 2017-04-06 — End: 2017-12-20

## 2017-04-06 NOTE — Telephone Encounter (Signed)
Requested Prescriptions     Pending Prescriptions Disp Refills   . buPROPion (WELLBUTRIN) 75 MG tablet 60 tablet 3     Sig: Take 1 tablet by mouth 2 times daily.       Last filled in Epic 02/05/17  Qty 60 w/3 refills.    Last OV: 02/28/2017 Veleta Miners, MD   Current mild episode of major depressive disorder, unspecified whether recurrent (HCC)  Anxiety  - subjective improvement on Wellbutrin 75 mg BID and Buspar 5 mg  - continue current management  - follow up 6 months or sooner PRN    No future appointments.     Medication is active on list and no changes have been made.    Medication Pended.

## 2017-04-06 NOTE — Telephone Encounter (Signed)
Routed penned order to Dr Gonzales,MD for review. Lilee Aldea, LPN

## 2017-04-12 ENCOUNTER — Encounter: Attending: PAC | Primary: MD

## 2017-05-14 MED ORDER — omeprazole (PRILOSEC) 20 MG capsule
20 | ORAL_CAPSULE | Freq: Every day | ORAL | 3 refills | 90.00000 days | Status: DC
Start: 2017-05-14 — End: 2017-11-13

## 2017-06-15 LAB — ALT: ALT - Alanine Aminotransferase: 16 IU/L (ref 5–33)

## 2017-06-15 LAB — OBSOLETE HIV 1,2 AB/AG SCREEN
HIV antigen: NEGATIVE
HIV-1, 2 Ab Screen (Rapid): NEGATIVE

## 2017-06-15 LAB — HEPATITIS C ANTIBODY SCREEN W/RNA PCR REFLEX: Hepatitis C Antibody Screen: NEGATIVE

## 2017-06-15 LAB — HEPATITIS B SURFACE ANTIBODY, QUALITATIVE/QUANTITATIVE (MAYO)
HBs Ab Quant: 16 m[IU]/mL
HBs Antibody: POSITIVE

## 2017-09-22 LAB — OBSOLETE HIV 1,2 AB/AG SCREEN
HIV antigen: NEGATIVE
HIV-1, 2 Ab Screen (Rapid): NEGATIVE

## 2017-09-22 LAB — HEPATITIS B SURFACE ANTIBODY (SLM): Hepatitis B Surface Antibody Interpretation: POSITIVE — AB

## 2017-09-22 LAB — HEPATITIS C VIRUS ANTIBODY W/ REFLEX HCV RNA PCR (SLM): Hepatitis C Virus Antibody Interpretation: NONREACTIVE

## 2017-09-22 LAB — ALT: ALT - Alanine Aminotransferase: 11 IU/L (ref 5–33)

## 2017-10-24 NOTE — Telephone Encounter (Signed)
Note to pharmacy that patient needs appt. Last note in December 2018:    Current mild episode of major depressive disorder, unspecified whether recurrent (HCC)  Anxiety  - subjective improvement on Wellbutrin 75 mg BID and Buspar 5 mg  - continue current management  - follow up 6 months or sooner PRN    Winfred Burn, LPN

## 2017-10-24 NOTE — Telephone Encounter (Signed)
Requested Prescriptions     Pending Prescriptions Disp Refills   . busPIRone (BUSPAR) 15 MG tablet [Pharmacy Med Name: BUSPIRONE HCL 15MG  TABS] 30 tablet 1     Sig: TAKE 1/3 TABLET (5MG ) BY MOUTH THREE TIMES A DAY AS NEEDED FOR ANXIETY        Office Notes: Rachel [name] is a 32 y.o. female presenting today to f/u depression  Last visit late November with me re: same, started Wellbutrin and Buspar PRN  ?  ? Today states feels much better  ? Started exercising again  ? Has noticed that she is not snacking as much  ? Taking Buspar QAM with other meds  Does not want to make any dose changes    ?  Current mild episode of major depressive disorder, unspecified whether recurrent (HCC)  Anxiety  - subjective improvement on Wellbutrin 75 mg BID and Buspar 5 mg  - continue current management  - follow up 6 months or sooner PRN    No future appointments.  Last Completed O/V: 02/28/17    BP Readings from Last 3 Encounters:   02/28/17 110/60   02/05/17 108/62   08/30/16 112/74     Pulse Readings from Last 3 Encounters:   02/28/17 72   02/05/17 72   08/30/16 81     Last Completed Labs:  Lab Results   Component Value Date    TSH 2.02 06/04/2015     Lab Results   Component Value Date    GLYCOA1C 4.9 01/16/2017     LDL Calculated   Date Value Ref Range Status   01/16/2017 100 0 - 129 mg/dL Final     Triglyceride   Date Value Ref Range Status   01/16/2017 121.0 <=150.0 mg/dL Final     Sodium   Date Value Ref Range Status   08/30/2016 140 135 - 145 mmol/L Final     Potassium   Date Value Ref Range Status   08/30/2016 3.90 3.50 - 5.10 mmol/L Final     AST - Aspartate Aminotransferase   Date Value Ref Range Status   08/30/2016 18 5 - 32 IU/L Final     ALT - Alanine Aminotransferase   Date Value Ref Range Status   09/21/2017 11 5 - 33 IU/L Final     BUN   Date Value Ref Range Status   08/30/2016 12 8 - 20 mg/dL Final     Creatinine   Date Value Ref Range Status   07/25/2013 0.67 0.60 - 1.30 mg/dl Final     No results found for:  HGBA1C, MICROALBUR, MALB24HUR  GFR Estimate   Date Value Ref Range Status   07/25/2013 > 60  Final     Comment:     Reference Range: >60 ml/min/1.79m^2     Glomerular Filtration Rate Estimate   Date Value Ref Range Status   06/04/2015 >60.0 >=60.0 mL/min/1.63m*2 Final     Glomerular Filtration Rate Estimate (Female)   Date Value Ref Range Status   08/30/2016 >60.0 >=60.0 mL/min/1.25m*2 Final

## 2017-11-13 DIAGNOSIS — M79605 Pain in left leg: Secondary | ICD-10-CM

## 2017-11-13 NOTE — ED Triage Notes (Signed)
Pt reports throbbing pain to left leg from thigh to calf, numbness to foot. Pt is able to ambulate with pain. Denies injuries, no hx of clots, does take birth control pill.
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
## 2017-11-14 ENCOUNTER — Inpatient Hospital Stay: Admit: 2017-11-14 | Discharge: 2017-11-14 | Disposition: A | Discharge status: Home / Self Care | Attending: MD

## 2017-11-14 ENCOUNTER — Emergency Department: Admit: 2017-11-14 | Discharge: 2017-12-03 | Primary: MD

## 2017-11-14 NOTE — ED Notes (Signed)
Patient denied any further questions or concerns at discharge.

## 2017-11-15 NOTE — Telephone Encounter (Signed)
Patient calls in stating that she has been having a hard time with sleep. She was scheduled for 12/20/17.     Winfred Burn, LPN

## 2017-12-20 ENCOUNTER — Ambulatory Visit: Admit: 2017-12-20 | Discharge: 2017-12-26 | Attending: MD | Primary: MD

## 2017-12-20 DIAGNOSIS — F411 Generalized anxiety disorder: Secondary | ICD-10-CM

## 2017-12-20 MED ORDER — buPROPion (WELLBUTRIN SR) 150 MG 12 hr tablet
150 | ORAL_TABLET | Freq: Two times a day (BID) | ORAL | 3 refills | 90.00000 days | Status: DC
Start: 2017-12-20 — End: 2020-02-24

## 2017-12-20 MED ORDER — PARoxetine (PAXIL) 20 MG tablet
20 | ORAL_TABLET | Freq: Every morning | ORAL | 3 refills | 30.00000 days | Status: DC
Start: 2017-12-20 — End: 2018-08-13

## 2017-12-20 NOTE — Patient Instructions (Signed)
Increase Wellbutrin to 150 mg twice a day - sent to [employer] Paxil - sent to [facility] - may take 6 weeks to notice a difference  Continue Buspar if helpful, ok to combine this with the others above  Talk to [place] about ablation? Different birth control?  Exercise is a natural libido-booster and antidepressant   Consider stopping metformin

## 2017-12-20 NOTE — Progress Notes (Addendum)
OFFICE VISIT    CC:   Chief Complaint   Patient presents with   . Follow-up       HPI  Rachel [name] is a 32 y.o. 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 Family history of cancer FAMILY HISTORY OF CANCER   . PCOS (polycystic ovarian syndrome) POLYCYSTIC OVARY SYNDROME   . Irregular periods/menstrual cycles IRREGULAR PERIODS   . LLQ pain LEFT LOWER QUADRANT PAIN   . Multigravida MULTIGRAVIDA   . 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 Genetic screening PATIENT ENCOUNTER STATUS   . Chest tightness or pressure CHEST DISCOMFORT   . [redacted] weeks gestation of pregnancy GESTATION PERIOD, 34 WEEKS   . Chest pressure CHEST DISCOMFORT   . 1st degree AV block FIRST DEGREE ATRIOVENTRICULAR BLOCK       Outpatient Medications Marked as Taking for the 12/20/17 encounter (Office Visit) with Veleta Miners, MD   Medication Sig Dispense Refill   . busPIRone (BUSPAR) 5 MG tablet Take 1 tablet by mouth 3 times daily as needed for Anxiety. 270 tablet 1   . busPIRone (BUSPAR) 5 MG tablet Take 1 tablet by mouth 3 times daily. PRN anxiety 270 tablet 0   . herbal drugs Cap Take 1 capsule by mouth daily. Colon cleanse     . metFORMIN (GLUCOPHAGE) 1000 MG tablet Take by mouth. 1000mg  in AM and 500mg  in PM      . modafinil (PROVIGIL) 200 MG tablet Take 1 tablet by mouth daily. Take 1 hour prior to start of work shift for shift work sleep disorder. 30 tablet 2   . norgestimate-ethinyl estradiol (SPRINTEC, 28, ORAL) Take by mouth.     . [DISCONTINUED] buPROPion (WELLBUTRIN) 75 MG tablet Take 1 tablet by mouth 2 times daily. 180 tablet 3       Allergies and past medical, family, and social history were reviewed and updated as needed.    ROS  Bold items indicate positive  Gen: Negative for unintentional weight loss, fevers, night sweats, insomnia  HEENT: Negative for photophobia, eye discharge, rhinorrhea, ear pain, throat pain  CV: Negative for chest pain, palpitations, leg swelling  Pulm: Negative for wheezing, dyspnea, cough  Abd: Negative for vomiting, diarrhea, hematochezia, melena  GU: Negative for dysuria, urgency, frequency  MSK: Negative for joint swelling, acute joint pain, falls  Skin: Negative for new rashes  Lymph: Negative for enlarged lymph nodes or lymphadenopathy  Neuro: Negative for LOC, seizures, new headaches, focal weakness  Psych: +anxiety, depression, denies SI, HI     Physical Exam:  BP 110/70 (BP Location: Left arm, Patient Position: Sitting)   Pulse 68   Temp  37.1 ?C (98.8 ?F) (Temporal)   Resp 14   Wt 170 lb (77.1 kg)   SpO2 98%   BMI 28.29 kg/m?     Gen: nontoxic appearing female in NAD, on RA  HEENT: NCAT, EOMI, no conjunctival injection or discharge  CV: RRRs  Lungs: normal work of breathing on room air  Lower ext: warm and well perfused, no edema noted bilaterally  Neuro: AOx4, CNII-XII grossly intact, normal gait, otherwise nonfocal  Psych: affect appropriate, mood congruent    Assessment and Plan  Rachel [name] was seen today for follow-up.    Diagnoses and all orders for this visit:    GAD  Chronic, exacerbated  Will add paroxetine and increase wellbutrin dose  TIPPS skills discussed today with handout  Encouraged resuming exercise  -     buPROPion (WELLBUTRIN SR) 150 MG 12 hr tablet; Take  1 tablet by mouth 2 times daily.  -     PARoxetine (PAXIL) 20 MG tablet; Take 1 tablet by mouth every morning.    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

## 2017-12-20 NOTE — Progress Notes (Signed)
Rachel [name] is a 32 y.o. female whom presents today for sleep issues. Patient states that she was started on Buspar and Wellbutrin a few months ago and she thinks it is working well for her, but she often has trouble sleeping with her anxiety. She thinks that she might have vitamin deficiency or hormone imbalance.    BP: 110/70 / Pulse: 68 / SpO2: 98 % / Temp: 37.1 ?C (98.8 ?F)    Screening:                                 _________________________________________________    Health Maintenance Due   Topic Date Due   . PAP SMEAR W/HPV CO-TESTING  06/15/2017   . INFLUENZA VACCINE  12/11/2017       _________________________________________________        Vitals:    12/20/17 1458   BP: 110/70   BP Location: Left arm   Patient Position: Sitting   Pulse: 68   Resp: 14   Temp: 37.1 ?C (98.8 ?F)   TempSrc: Temporal   SpO2: 98%   Weight: 170 lb (77.1 kg)         Allergies Reviewed  Allergies reviewed with patient or care provider: Yes

## 2017-12-21 NOTE — Addendum Note (Signed)
Addended by: Veleta Miners on: 12/21/2017 04:50 PM     Modules accepted: Level of Service

## 2018-01-10 LAB — OBSOLETE HIV 1,2 AB/AG SCREEN
HIV antigen: NEGATIVE
HIV-1, 2 Ab Screen (Rapid): NEGATIVE

## 2018-01-10 LAB — ALT: ALT - Alanine Aminotransferase: 15 IU/L (ref 5–33)

## 2018-01-10 LAB — HEPATITIS C VIRUS ANTIBODY W/ REFLEX HCV RNA PCR (SLM): Hepatitis C Virus Antibody Interpretation: NONREACTIVE

## 2018-01-10 LAB — HEPATITIS B SURFACE ANTIBODY (SLM): Hepatitis B Surface Antibody Interpretation: POSITIVE — AB

## 2018-02-16 LAB — GLYCO-HEMOGLOBIN A1C: Glycohemoglobin (A1c): 4.8 % (ref <=5.6)

## 2018-02-16 LAB — LIPID PANEL
Chol/HDL Ratio: 2.8 (ref 0.0–5.0)
Cholesterol, HDL: 60 mg/dL (ref >=60)
Cholesterol: 168 mg/dL (ref <=200)
LDL Calculated: 90 mg/dL (ref 0–129)
Triglyceride: 91 mg/dL (ref <=150.0)
VLDL Cholesterol Calculation: 18.2 mg/dL (ref 7.0–32.0)

## 2018-03-13 HISTORY — PX: ABDOMINAL SURGERY: SHX537

## 2018-05-14 NOTE — Telephone Encounter (Signed)
Patient was increased to 150 mg, Rachel [name] has decided to stay with the 75 mg Wellbutrin. Rachel [name] is wondering if Dr.Gonzales would be willing to authorize the 75 mg again...    -------------------------------  Requested Prescriptions     Pending Prescriptions Disp Refills   . buPROPion (WELLBUTRIN) 75 MG tablet [Pharmacy Med Name: BUPROPION HCL 75MG  TABS] 180 tablet 3     Sig: TAKE ONE TABLET BY MOUTH TWICE A DAY        Last office note regarding requested medication(s): GAD  Chronic, exacerbated  Will add paroxetine and increase wellbutrin dose  TIPPS skills discussed today with handout  Encouraged resuming exercise=  -     buPROPion (WELLBUTRIN SR) 150 MG 12 hr   -     PARoxetine (PAXIL) 20 MG tablet;     No future appointments.  Last Completed O/V: 12/20/2017     Medication pending your approval.    Thank you, Hadden Steig       R/C  Ext [phone]                                 BP Readings from Last 3 Encounters:   12/20/17 110/70   11/13/17 152/84   02/28/17 110/60     Pulse Readings from Last 3 Encounters:   12/20/17 68   11/13/17 80   02/28/17 72     Last Completed Labs:  Lab Results   Component Value Date    TSH 2.02 06/04/2015     Lab Results   Component Value Date    GLYCOA1C 4.8 02/16/2018     LDL Calculated   Date Value Ref Range Status   02/16/2018 90 0 - 129 mg/dL Final     Triglyceride   Date Value Ref Range Status   02/16/2018 91.0 <=150.0 mg/dL Final     Sodium   Date Value Ref Range Status   08/30/2016 140 135 - 145 mmol/L Final     Potassium   Date Value Ref Range Status   08/30/2016 3.90 3.50 - 5.10 mmol/L Final     AST - Aspartate Aminotransferase   Date Value Ref Range Status   08/30/2016 18 5 - 32 IU/L Final     ALT - Alanine Aminotransferase   Date Value Ref Range Status   01/09/2018 15 5 - 33 IU/L Final     BUN   Date Value Ref Range Status   08/30/2016 12 8 - 20 mg/dL Final     Creatinine   Date Value Ref Range Status   07/25/2013 0.67 0.60 - 1.30 mg/dl Final     No results found for: HGBA1C, MICROALBUR,  MALB24HUR  GFR Estimate   Date Value Ref Range Status   07/25/2013 > 60  Final     Comment:     Reference Range: >60 ml/min/1.80m^2     Glomerular Filtration Rate Estimate   Date Value Ref Range Status   06/04/2015 >60.0 >=60.0 mL/min/1.58m*2 Final     Glomerular Filtration Rate Estimate (Female)   Date Value Ref Range Status   08/30/2016 >60.0 >=60.0 mL/min/1.19m*2 Final

## 2018-05-14 NOTE — Telephone Encounter (Signed)
Routed to PCP for approval.     Karn Cassis, LPN

## 2018-08-13 ENCOUNTER — Inpatient Hospital Stay: Admit: 2018-08-13 | Discharge: 2018-08-13 | Disposition: A | Discharge status: Home / Self Care | Attending: MD

## 2018-08-13 DIAGNOSIS — G43809 Other migraine, not intractable, without status migrainosus: Secondary | ICD-10-CM

## 2018-08-13 MED ORDER — ketorolac (TORADOL) 15 mg/mL injection 15 mg
15 | Freq: Once | INTRAMUSCULAR | Status: AC
Start: 2018-08-13 — End: 2018-08-13
  Administered 2018-08-13: 16:00:00 15 mg via INTRAVENOUS

## 2018-08-13 MED ORDER — promethazine (PHENERGAN) 25 MG tablet
25 | ORAL_TABLET | Freq: Four times a day (QID) | ORAL | 0 refills | Status: AC | PRN
Start: 2018-08-13 — End: 2018-09-12

## 2018-08-13 MED ORDER — prochlorperazine (COMPAZINE) injection 10 mg
10 | Freq: Once | INTRAMUSCULAR | Status: AC
Start: 2018-08-13 — End: 2018-08-13
  Administered 2018-08-13: 16:00:00 10 mg via INTRAVENOUS

## 2018-08-13 MED ORDER — sumatriptan (IMITREX) injection 6 mg
6 | Freq: Once | SUBCUTANEOUS | Status: AC
Start: 2018-08-13 — End: 2018-08-13
  Administered 2018-08-13: 15:00:00 6 mg via SUBCUTANEOUS

## 2018-08-13 MED ORDER — diphenhydrAMINE (BENADRYL) injection 25 mg
50 | Freq: Once | INTRAMUSCULAR | Status: AC
Start: 2018-08-13 — End: 2018-08-13
  Administered 2018-08-13: 16:00:00 50 mg via INTRAVENOUS

## 2018-08-13 NOTE — ED Triage Notes (Signed)
Visual changes rt eye starting this am
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
## 2018-08-13 NOTE — ED Notes (Signed)
Relates that vision improved to rt eye.  Relates that starting to get a lt occipital HA

## 2018-09-09 LAB — MMRV IMMUNE STATUS PROFILE
Mumps IgG Index: 2.4
Mumps IgG: POSITIVE
Rubella Ab IgG: POSITIVE
Rubella IgG Antibody Index: 1.2
Rubeola Ab IgG: POSITIVE
Rubeola IgG Ab Index: 4.9
Varicella IgG Ab Index: 5.9
Varicella Zoster Ab IgG: POSITIVE

## 2018-09-09 LAB — QUANTIFERON - TB GOLD PLUS
Mitgoen minus Nil Result: 7.79 IU/mL
Nil Result: 0.02 IU/mL
QuantiFERON-Tb Gold Plus Result: NEGATIVE
TB1 Ag minus Nil Result: 0.01 IU/mL
TB2 Ag minus Nil Result: 0.01 IU/mL

## 2018-12-20 ENCOUNTER — Other Ambulatory Visit: Admit: 2018-12-20 | Discharge: 2018-12-20 | Primary: MD

## 2018-12-20 DIAGNOSIS — Z3481 Encounter for supervision of other normal pregnancy, first trimester: Secondary | ICD-10-CM

## 2018-12-20 LAB — CBC WITH AUTO DIFFERENTIAL
Basophils %: 0 % (ref 0–2)
Basophils, Absolute: 0 10*3/ÂµL (ref 0.0–0.1)
Eosinophils %: 1 % (ref 0–5)
Eosinophils, Absolute: 0 10*3/ÂµL (ref 0.0–0.4)
HCT: 41.8 % (ref 35.0–48.0)
Hemoglobin: 14.4 g/dL (ref 11.7–16.5)
Lymphocytes %: 23 % (ref 20–40)
Lymphocytes, Absolute: 1.8 10*3/ÂµL (ref 1.0–4.0)
MCH: 30.2 pg (ref 28.3–33.3)
MCHC: 34.5 g/dL (ref 32.5–36.0)
MCV: 87.6 fL (ref 81.0–100.0)
MPV: 7.8 fL (ref 6.9–10.0)
Monocytes %: 7 % (ref 2–12)
Monocytes, Absolute: 0.5 10*3/ÂµL (ref 0.2–1.0)
Neutrophils %: 69 % (ref 50–74)
Neutrophils, Absolute: 5.3 10*3/ÂµL (ref 2.0–7.4)
Platelet Count: 186 10*3/ÂµL (ref 150–405)
RBC: 4.77 10*6/ÂµL (ref 3.80–5.60)
RDW: 12.4 % (ref 11.7–16.1)
WBC: 7.7 10*3/ÂµL (ref 4.5–11.0)

## 2018-12-20 LAB — OBSOLETE HIV 1,2 AB/AG SCREEN
HIV antigen: NEGATIVE
HIV-1, 2 Ab Screen (Rapid): NEGATIVE

## 2018-12-20 LAB — TSH: TSH - Thyroid Stimulating Hormone: 2.25 u[IU]/mL (ref 0.34–5.60)

## 2018-12-20 LAB — HEPATITIS B SURFACE ANTIGEN (SLM): Hepatitis B Surface Antigen Interpretation: NONREACTIVE

## 2018-12-20 LAB — ABO/RH (HCLL): Rh (D): POSITIVE

## 2018-12-20 LAB — VDRL/RPR: VDRL / RPR: NONREACTIVE

## 2018-12-20 LAB — RUBELLA ANTIBODY, IGG: Rubella, IgG: 17 IU/mL — ABNORMAL HIGH (ref <10)

## 2018-12-20 LAB — ANTIBODY SCREEN (HCLL): Antibody Screen: NEGATIVE

## 2018-12-20 LAB — BETA-HCG, SERUM, QUANTITATIVE: hCG Quantitative (Serum): 1135 m[IU]/mL — ABNORMAL HIGH (ref 0.0–5.0)

## 2018-12-23 ENCOUNTER — Other Ambulatory Visit: Admit: 2018-12-23 | Discharge: 2018-12-23 | Primary: MD

## 2018-12-23 DIAGNOSIS — R1032 Left lower quadrant pain: Secondary | ICD-10-CM

## 2018-12-23 LAB — BETA-HCG, SERUM, QUANTITATIVE: hCG Quantitative (Serum): 3909 m[IU]/mL — ABNORMAL HIGH (ref 0.0–5.0)

## 2018-12-24 ENCOUNTER — Inpatient Hospital Stay: Admit: 2018-12-24 | Discharge: 2019-02-12 | Attending: MD | Primary: MD

## 2018-12-24 DIAGNOSIS — O26891 Other specified pregnancy related conditions, first trimester: Secondary | ICD-10-CM

## 2019-01-23 ENCOUNTER — Ambulatory Visit: Admit: 2019-01-28 | Discharge: 2019-04-10 | Primary: MD

## 2019-01-23 ENCOUNTER — Other Ambulatory Visit: Admit: 2019-01-28 | Primary: MD

## 2019-01-23 DIAGNOSIS — R0602 Shortness of breath: Secondary | ICD-10-CM

## 2019-01-23 DIAGNOSIS — Z01810 Encounter for preprocedural cardiovascular examination: Secondary | ICD-10-CM

## 2019-01-29 ENCOUNTER — Inpatient Hospital Stay: Admit: 2019-02-12 | Attending: MD | Primary: MD

## 2019-01-29 DIAGNOSIS — O99411 Diseases of the circulatory system complicating pregnancy, first trimester: Secondary | ICD-10-CM

## 2019-02-12 ENCOUNTER — Institutional Professional Consult (permissible substitution): Admit: 2019-02-12 | Discharge: 2019-02-12 | Primary: MD

## 2019-02-12 ENCOUNTER — Ambulatory Visit: Admit: 2019-02-12 | Discharge: 2019-02-12 | Payer: PRIVATE HEALTH INSURANCE | Primary: MD

## 2019-02-12 ENCOUNTER — Inpatient Hospital Stay: Admit: 2019-02-12 | Discharge: 2019-02-12 | Attending: Maternal & Fetal Medicine | Primary: MD

## 2019-02-12 DIAGNOSIS — Z3682 Encounter for antenatal screening for nuchal translucency: Secondary | ICD-10-CM

## 2019-02-12 DIAGNOSIS — O361910 Maternal care for other isoimmunization, first trimester, not applicable or unspecified: Secondary | ICD-10-CM

## 2019-02-12 DIAGNOSIS — Z369 Encounter for antenatal screening, unspecified: Secondary | ICD-10-CM

## 2019-02-12 NOTE — Progress Notes (Signed)
Rachel [name] is a 33 y.o. female who presents today for doctor visit and [place] for NT.    BP: 118/60 /   /   / Temp: 36.9 ?C (98.5 ?F)    Body mass index is 27.96 kg/m?Marland Kitchen       Ambulatory Nursing Form     Do you have a cough, fever, shortness of breath or acute illness?  no  If yes, mask was provided to patient for office/clinical exam time period? Yes, all patients are being given masks regardless of symptoms.      Have you traveled within the last 30 days outside the [place]? no  If yes, has testing been obtained? Have you had any contact with individuals confirmed to have COVID-19? no

## 2019-02-14 ENCOUNTER — Other Ambulatory Visit: Primary: MD

## 2019-02-14 ENCOUNTER — Encounter: Payer: PRIVATE HEALTH INSURANCE | Primary: MD

## 2019-02-15 LAB — SARS-COV-2 (COVID-19), DIAGNOSTIC: SARS-CoV-2 (COVID-19) by NAAT: POSITIVE — ABNORMAL HIGH

## 2019-02-17 ENCOUNTER — Encounter: Payer: PRIVATE HEALTH INSURANCE | Primary: MD

## 2019-02-21 ENCOUNTER — Inpatient Hospital Stay: Admit: 2019-02-21 | Discharge: 2019-02-22 | Disposition: A | Discharge status: Home / Self Care | Attending: DO

## 2019-02-21 ENCOUNTER — Emergency Department: Admit: 2019-02-22 | Primary: MD

## 2019-02-21 DIAGNOSIS — O26892 Other specified pregnancy related conditions, second trimester: Secondary | ICD-10-CM

## 2019-02-21 LAB — COMPREHENSIVE METABOLIC PANEL
ALT - Alanine Aminotransferase: 9 IU/L (ref 5–33)
AST - Aspartate Aminotransferase: 14 IU/L (ref 5–32)
Albumin/Globulin Ratio: 1.1 — ABNORMAL LOW (ref 1.2–2.2)
Albumin: 3.7 g/dL (ref 3.5–5.0)
Alkaline Phosphatase: 82 IU/L (ref 35–105)
Anion Gap: 15.6 mmol/L (ref 9.0–18.0)
BUN / Creatinine Ratio: 11.8 — ABNORMAL LOW (ref 12.0–20.0)
BUN: 6 mg/dL — ABNORMAL LOW (ref 8–20)
Bilirubin Total: 0.4 mg/dL (ref 0.10–1.70)
CO2 - Carbon Dioxide: 21 mmol/L (ref 17–27)
Calcium: 8.9 mg/dL (ref 8.6–10.6)
Chloride: 98.4 mmol/L — ABNORMAL LOW (ref 101.0–111.0)
Creatinine: 0.51 mg/dL — ABNORMAL LOW (ref 0.60–1.30)
Glomerular Filtration Rate Estimate (Female): >90 mL/min/{1.73_m2} (ref >=60)
Glucose: 85 mg/dL (ref 74–106)
Osmolality Calculation: 267 mOsm/kg — ABNORMAL LOW (ref 275.0–300.0)
Potassium: 3.26 mmol/L — ABNORMAL LOW (ref 3.50–5.10)
Protein Total: 7.2 g/dL (ref 6.4–8.2)
Sodium: 135 mmol/L (ref 135–145)

## 2019-02-21 LAB — CBC WITH AUTO DIFFERENTIAL
Basophils %: 0 % (ref 0–2)
Basophils, Absolute: 0 10*3/ÂµL (ref 0.0–0.1)
Eosinophils %: 0 % (ref 0–5)
Eosinophils, Absolute: 0 10*3/ÂµL (ref 0.0–0.4)
HCT: 40.5 % (ref 35.0–48.0)
Hemoglobin: 14.5 g/dL (ref 11.7–16.5)
Lymphocytes %: 13 % — ABNORMAL LOW (ref 20–40)
Lymphocytes, Absolute: 1.2 10*3/ÂµL (ref 1.0–4.0)
MCH: 30.3 pg (ref 28.3–33.3)
MCHC: 35.8 g/dL (ref 32.5–36.0)
MCV: 84.6 fL (ref 81.0–100.0)
MPV: 7.5 fL (ref 6.9–10.0)
Monocytes %: 6 % (ref 2–12)
Monocytes, Absolute: 0.6 10*3/ÂµL (ref 0.2–1.0)
Neutrophils %: 80 % — ABNORMAL HIGH (ref 50–74)
Neutrophils, Absolute: 7.5 10*3/ÂµL — ABNORMAL HIGH (ref 2.0–7.4)
Platelet Count: 158 10*3/ÂµL (ref 150–405)
RBC: 4.79 10*6/ÂµL (ref 3.80–5.60)
RDW: 12.4 % (ref 11.7–16.1)
WBC: 9.3 10*3/ÂµL (ref 4.5–11.0)

## 2019-02-21 LAB — ABO/RH (HCLL): Rh (D): POSITIVE

## 2019-02-21 LAB — ANTIBODY SCREEN (HCLL): Antibody Screen: NEGATIVE

## 2019-02-21 LAB — BETA-HCG, SERUM, QUANTITATIVE: hCG Quantitative (Serum): 104280 m[IU]/mL — ABNORMAL HIGH (ref 0.0–5.0)

## 2019-02-21 LAB — MAGNESIUM: Magnesium: 2.1 mg/dL (ref 1.3–2.5)

## 2019-02-21 LAB — PROTIME-INR
INR: 1.1 (ref 0.9–1.1)
Protime: 13.1 Seconds (ref 10.0–13.2)

## 2019-02-21 LAB — LIPASE: Lipase: 16 IU/L — ABNORMAL LOW (ref 22–51)

## 2019-02-21 MED ORDER — sodium chloride (NS) 0.9% bolus 1,000 mL
0.9 | Freq: Once | INTRAVENOUS | Status: DC
Start: 2019-02-21 — End: 2019-02-21

## 2019-02-21 NOTE — ED Notes (Signed)
Pt medicated per MAR. In position of comfort, call light within reach.

## 2019-02-21 NOTE — ED Notes (Signed)
Bed: 528-01  Expected date:   Expected time:   Means of arrival:   Comments:  JC
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
## 2019-02-21 NOTE — Discharge Instructions (Addendum)
Treatment options were discussed and you felt comfortable with discharge at present time.    If your symptoms progress/worsen or you have any concerns please return immediately to the ER.    Today your ultrasound revealed a intrauterine pregnancy with a fetal heart rate of 153 bpm.  You did have findings of potential placenta previa.  The radiologist is recommending follow-up ultrasound within 4 to 6 weeks to assess placental migration.    I did speak with Dr. Luetta Nutting, the on-call obstetrician, who would like to reevaluate you next week via telemedicine.  He is requesting that you contact Dr. Henriette Combs office to arrange this appointment.  When you make this appointment please let them know that you tested positive for COVID-19 and they are recommending a telemedicine visit.    It is not clear the exact cause for your bloody diarrhea.  It is possible this is secondary to your COVID-19 infection.  However this is not confirmed.  You tested negative for Clostridium difficile.  Stool cultures have been sent and are currently pending.  If your bleeding progresses/worsens or you have any other concerning symptoms return immediately to the ER.  I would recommend colonoscopy on an outpatient basis, by any surgeon of your choice, to make sure there is no other pathological process occurring not clearly identified in the ER.    Today your potassium and chloride were mildly low at 3.26 and 98.4 respectively.  You did receive some potassium chloride supplementation with your permission.  Please have this rechecked by your primary care provider to make sure this resolves with the treatment rendered to the ER and to make sure there is no other pathologic process occurring not clearly identified in the ER.    Today your lab values revealed that you were mildly dehydrated with ketones in your urine.  Please make sure you are maintaining adequate fluid intake especially with water and fluids with electrolytes.    We discussed  prescribing you various medications to try to help with your nausea and vomiting.  There are risks associated with any medication to you and your unborn child.  You felt comfortable proceeding.  You were prescribed Zofran for any nausea vomiting.    Please do not take any medications unless prescribed by a physician who is aware of your pregnancy status.    At this point in time you are feeling better and feel comfortable with discharge.    If your symptoms continue/worsen or you develop any additional abnormal symptoms return the ER.    Please be reevaluated early next week by your doctor and Dr. Luetta Nutting.  I would recommend reevaluation of the telemedicine.  When you call to arrange your appointment please let them know that you tested positive for COVID-19 so they can arrange telemedicine visits for you.  If your symptoms progress or worsen or you have any concerns please return the ER.    I included a copy of your ultrasound results to follow with Dr. Luetta Nutting.      Limited fetal ultrasound:    HISTORY:  14 weeks 1 day pregnant with abdominal cramping.  Recent Covid positive     COMPARISON:  02/12/2019     TECHNIQUE:  Limited transabdominal sonographic examination for obstetrical and fetal evaluation.     MEASUREMENTS:  The following measurements are provided, if available:  CLINICAL GA:  CLINICAL EDD:     ULTRASOUND GA (AUA):  ULTRASOUND EDD (AUA):     EFW:  EFW Percentile:  CARDIAC ACTIVITY:   153 BPM     CERVICAL LENGTH:     AMNIOTIC FLUID INDEX:      AMNIOTIC FLUID VOLUME: Normal amount.     FETAL NUMBER: Single.     POSITION: Variable     PLACENTA LOCATION: Anterior-low-lying.  Potential placenta previa, with the inferior margin of the placenta within 1 cm of the internal cervical os. Follow-up ultrasound should be considered within 4-6 weeks to assess placental migration. No evidence of   subchorionic hemorrhage or marginal placental abruption. Benign placental [place] is noted anteriorly.         IMPRESSION:  1. Potential placenta previa, with the inferior margin of the placenta within 1 cm of the internal cervical os. Follow-up ultrasound should be considered within 4-6 weeks to assess placental migration.  NOTE:  This dictation was produced using voice recognition software. Although effort has been made to minimize transcription errors, homonyms and other transcription errors may be present and may not truly reflect my intent. Please excuse any unintentional   verbiage, or grammatical errors.     Dictated by: Junious Dresser  Electronically Signed by: Junious Dresser on 02/21/2019 6:21 PM     Report ID#: [id number]

## 2019-02-21 NOTE — ED Notes (Signed)
US at bedside

## 2019-02-21 NOTE — ED Notes (Signed)
Pt ambulated to bathroom without incident. Back in bed with call light within reach.

## 2019-02-21 NOTE — ED Notes (Signed)
Provider at bedside

## 2019-02-21 NOTE — ED Notes (Signed)
Pt unable to provide sample at this time.

## 2019-02-21 NOTE — ED Provider Notes (Signed)
I was notified by microbiology that patient tested positive for Campylobacter species in her stool.    I tried contacting patient to see how she is doing.  Unfortunately was not able to reach her.    I spoke with our ER pharmacist who feels it is appropriate to prescribe patient azithromycin 500 mg daily for 3 days.  Patient is currently pregnant but he feels this would still be appropriate medication.    Since I was not able to reach patient I will have my nursing staff continue to reach out to patient.  If she continues to have abnormal symptoms or concerns she will be instructed to return the ER.  If she is doing better we will offer azithromycin if she would like to take it during her pregnancy.

## 2019-02-21 NOTE — ED Triage Notes (Addendum)
Pt reports blood in stool that started today. Pt also reports abdominal cramping x1 day. Denies emesis.    Pt [redacted] weeks pregnant    Dx of covid last week.

## 2019-02-22 LAB — URINALYSIS WITH MICROSCOPIC, CULTURE IF INDICATED
Ascorbic Acid, Urine: NEGATIVE
Bilirubin, Urine: NEGATIVE
Glucose, Urine: NEGATIVE mg/dL
Ketones, Urine: 80 mg/dL — AB
Nitrite, Urine: NEGATIVE
Protein, Urine: NEGATIVE mg/dL
RBC, Urine: 3 /HPF (ref 0–4)
Renal/Transitional Epithelial, Urine: 0 /HPF (ref 0–2)
Specific Gravity, Urine: 1.013 (ref 1.005–1.030)
Squamous Epithelial, UA: 13 /HPF — AB (ref 0–4)
Urobilinogen, Urine: <2 mg/dL (ref <2.0)
WBC, Urine: 11 /HPF — AB (ref 0–4)
pH, UA: 6 (ref 5.0–7.5)

## 2019-02-22 LAB — STOOL CULTURE

## 2019-02-22 LAB — CLOSTRIDIUM DIFFICILE BY PCR
027-NAP-B1: NEGATIVE
Clostridium difficile by NAAT PCR: NEGATIVE

## 2019-02-22 LAB — URINE CULTURE (SLM): Culture Result: NO GROWTH

## 2019-02-22 LAB — OCCULT BLD, STOOL SCREEN: Occult Blood, Stool #1: POSITIVE — AB

## 2019-02-22 MED ORDER — ondansetron (ZOFRAN) injection 4 mg
4 | INTRAMUSCULAR | Status: DC | PRN
Start: 2019-02-22 — End: 2019-02-21
  Administered 2019-02-22: 01:00:00 4 mg via INTRAVENOUS

## 2019-02-22 MED ORDER — sodium chloride (NS) 0.9% bolus 500 mL
0.9 | Freq: Once | INTRAVENOUS | Status: AC
Start: 2019-02-22 — End: 2019-02-21
  Administered 2019-02-22 (×2): via INTRAVENOUS

## 2019-02-22 MED ORDER — acetaminophen (TYLENOL) tablet 975 mg
325 | Freq: Once | ORAL | Status: AC
Start: 2019-02-22 — End: 2019-02-21
  Administered 2019-02-22: 02:00:00 325 mg via ORAL

## 2019-02-22 MED ORDER — potassium chloride (KLOR-CON) packet 40 mEq
20 | Freq: Once | ORAL | Status: AC
Start: 2019-02-22 — End: 2019-02-21
  Administered 2019-02-22: 03:00:00 20 meq via ORAL

## 2019-02-22 MED ORDER — ondansetron (ZOFRAN-ODT) 4 MG disintegrating tablet
4 | ORAL_TABLET | Freq: Three times a day (TID) | ORAL | 2 refills | Status: DC | PRN
Start: 2019-02-22 — End: 2019-05-28

## 2019-02-22 MED ORDER — sodium chloride (NS) 0.9% bolus 500 mL
0.9 | Freq: Once | INTRAVENOUS | Status: AC
Start: 2019-02-22 — End: 2019-02-21
  Administered 2019-02-22 (×2): via INTRAVENOUS

## 2019-02-25 NOTE — Other (Signed)
Patient was contacted about this result.  Azithromycin was called in for treatment.  No further treatment is necessary at this time.

## 2019-03-04 ENCOUNTER — Ambulatory Visit: Admit: 2019-03-05 | Discharge: 2019-03-05 | Attending: MD | Primary: MD

## 2019-03-04 DIAGNOSIS — Z3A15 15 weeks gestation of pregnancy: Secondary | ICD-10-CM

## 2019-03-04 NOTE — Progress Notes (Signed)
Patient is a new Ob , she is now 15 weeks 5 days. 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

## 2019-03-05 LAB — URINALYSIS, MACROSCOPIC (DIP ONLY) (SLM POC)
Bilirubin, Urine: NEGATIVE
Blood, Urine: NEGATIVE
Glucose, Urine: NEGATIVE mg/dL
Ketones, Urine: NEGATIVE mg/dL
Leukocyte Esterase, Urine: NEGATIVE
Nitrite, Urine: NEGATIVE
Protein, Urine: NEGATIVE mg/dL
Specific Gravity, Urine: 1.015 (ref 1.005–1.030)
Urobilinogen, Urine: 0.2 mg/dL (ref 0.0–2.0)
pH, UA: 7 (ref 5.0–7.5)

## 2019-03-10 NOTE — Progress Notes (Signed)
Kehinde Totzke is a 33 y.o. female, DOB Mar 28, 1985, who presents today for her first OB visit.      History of Present Illness  HPI   Paizley is a 33 year old [id number] at [redacted]w[redacted]d who presents for her first OB visit.  She is transferring care from [facility].  Her pregnancies have been complicated by the Anti-MTa antibody, which can cause anemia in later term babies.  Dr. Park Breed with MFM is following her, and will get ultrasounds at least monthly to assess for fetal anemia.  Tienna denies fevers, chills, abnormal vaginal discharge, bleeding, or pelvic cramping.           Review of Systems     Constitutional:  Denies fevers or chills  Neurologic:  Denies headaches or neuropathy  CV:  Denies chest pain or palpitations  PULM:  Denies shortness of breath or cough  GI:  Denies diarrhea or constipation  GU:  Denies dysuria or hematuria  Ext:  Denies any musculoskeletal issues    Vitals:    03/04/19 1646   BP: 110/60   Pulse: 84   Temp: 37.3 ?C (99.2 ?F)   SpO2: 98%    Body mass index is 26.63 kg/m?Marland Kitchen  Physical Exam    General:  NAD  CV:  RRR  Abdomen:  Soft, nontender, nondistended  Ext:  No calf pain or swelling  Pelvic:  deferred    Prenatal Vitals     Enc. Date GA Prenatal Vitals Urine Albumin/Glucose Edema Prenatal Presentation Dilation/Effacement/Station    02/12/19 [redacted]w[redacted]d 118/60 / 168 lb (76.2 kg)         03/04/19 [redacted]w[redacted]d 110/60 / 165 lb (74.8 kg) Negative / Negative None / None  / 148      Number of fetuses: 1 Height: 5' 6 (1.676 m)           ASSESSMENT & PLAN:       ICD-10-CM    1. [redacted] weeks gestation of pregnancy  Z3A.15 Urinalysis, Macroscopic (Dip Only) POC -Routine     Urinalysis, Macroscopic (Dip Only) POC -Routine     33 year old [id number] at [redacted]w[redacted]d, fetal well being is reassuring.    1.  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  Anti-MTa antibodies   - MFM is following  patient   - monthly growth and MCA evaluation for fetal anemia   - may cause fetal anemia in late term babies    3.  Potential placenta previa   - inferior margin of placenta within 1 cm of internal os   - will recheck with anatomy scan at 20 weeks

## 2019-03-14 NOTE — L&D Delivery Note (Signed)
Vaginal Delivery Summary    Rachel [name]  Date of Delivery: 08/01/2019  Attending Physician: Kerin Ransom  Delivery Physician: Lewis Shock, MD    Early term pregnancy at [redacted]w[redacted]d s/p spontaneous vaginal delivery of viable female  infant. Mother presented for IOL for anti-Mta ab. GBS 07/16/2019: Positive for Group B Strep; Group B Streptococci are susceptible to ampicillin, penicillin, and cefazolin, but may be erythromycin and /or clindamycin resistant.  Contact laboratory if erythromycin and/or clindamycin testing is necessary., antibiotic prophylaxis with ampicillin given. Pregnancy complicated by anti-Mta ab with increasing MCA pressures.    Labor was augmented by pitocin and AROM.  Mom did have an epidural.  FHT prior to pushing were category 1 for the majority with the exception of intermittent variables the ~2 hours prior to pushing that responded to repositioning.    Patient progressed to complete around 0215 and pushed for approximately 1 minute. Baby descended well in LOA presentation, and no assistance was required with vacuum or forceps. FHT while pushing were category I. Delivery of a live female  infant weighing No birth weight on file.  with APGARS of   and   at 3:07 AM . No meconium, nuchal cord, or shoulder dystocia complicated delivery. Newborn delivered to maternal abdomen. Cord clamping was delayed, then cut by FOB.      Placenta delivered with gentle cord traction 5 minutes after delivery of infant. Placenta inspected and was intact with 3 vessel cord and no other significant abnormalities. Perineum was inspected and superficial perineal abrasions that did not require repair. Pitocin was administered just after delivery of the placenta. For specific times and other details, please see the delivery text below.    Mom and newborn recovering well.  Plans to breast feed.    Lewis Shock, MD  08/01/2019, 3:25 AM

## 2019-03-24 NOTE — Telephone Encounter (Signed)
Patient is called back.  She states she took collagen prior to pregnancy for her hair and nails.  Informed that there are no good studies in pregnancy.  There can be a difference between bovine and marine collagen as the marine can have higher mercury levels.  Informed that collagen can be found in things like jello and bone broth.  Encouraged to be careful with collagen supplements as they are not regulated by FDA.     Patient verbalizes understanding.

## 2019-03-24 NOTE — Telephone Encounter (Signed)
Patient is pregnant and is wondering if she can take her collagen.  Please call. jh

## 2019-03-24 NOTE — Telephone Encounter (Signed)
Tried to call patient back.  Left message to call clinic.      If patient calls back, collagen has not been tested well in pregnancy.  Need to find out why she would like to take it.

## 2019-03-24 NOTE — Telephone Encounter (Signed)
Patient returning Rachel [name]'s call.  Will have her call right back as soon as she is available. jh

## 2019-04-02 ENCOUNTER — Ambulatory Visit: Admit: 2019-04-02 | Discharge: 2019-04-03 | Attending: MD | Primary: MD

## 2019-04-02 ENCOUNTER — Other Ambulatory Visit: Admit: 2019-04-03 | Primary: MD

## 2019-04-02 DIAGNOSIS — Z3A19 19 weeks gestation of pregnancy: Secondary | ICD-10-CM

## 2019-04-02 DIAGNOSIS — O23592 Infection of other part of genital tract in pregnancy, second trimester: Secondary | ICD-10-CM

## 2019-04-02 LAB — URINALYSIS, MACROSCOPIC (DIP ONLY) (SLM POC)
Bilirubin, Urine: NEGATIVE
Blood, Urine: NEGATIVE
Glucose, Urine: NEGATIVE mg/dL
Ketones, Urine: NEGATIVE mg/dL
Leukocyte Esterase, Urine: NEGATIVE
Nitrite, Urine: NEGATIVE
Protein, Urine: NEGATIVE mg/dL
Specific Gravity, Urine: <=1.005 (ref 1.005–1.030)
Urobilinogen, Urine: 0.2 mg/dL (ref 0.0–2.0)
pH, UA: 5.5 (ref 5.0–7.5)

## 2019-04-02 LAB — CHLAMYDIA/GONOCOCCUS, NAA (LABCORP)
Chlamydia trachomatis, NAA: NEGATIVE
Neisseria gonorrhoeae, NAA: NEGATIVE

## 2019-04-02 NOTE — Progress Notes (Signed)
Rachel [name] is a 34 year old [id number] at [redacted]w[redacted]d who presents for a return OB visit.  She has good fetal movement.  She has been struggling with lower pelvic discomfort and increased vaginal discharge.  She denies vaginal odor.  She also denies vaginal bleeding.    O:  Prenatal Vitals     Enc. Date GA Prenatal Vitals Urine Albumin/Glucose Edema Prenatal Presentation Dilation/Effacement/Station    02/12/19 [redacted]w[redacted]d 118/60 / 168 lb (76.2 kg)         03/04/19 [redacted]w[redacted]d 110/60 / 165 lb (74.8 kg) Negative / Negative None / None  / 148      04/02/19 [redacted]w[redacted]d 118/60 / 171 lb (77.6 kg) Negative / Negative None / None  / 145 / Present      Number of fetuses: 1 Height: 5' 6 (1.676 m)           A/P:  34 year old [id number] at [redacted]w[redacted]d, fetal well being is reassuring.    1.  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  Bacterial vaginosis   - diagnosed today based on wet mount results.   - treated with flagyl  ?  3.  Anti-MTa antibodies              - MFM is following patient              - monthly growth and MCA evaluation for fetal anemia              - may cause fetal anemia in late term babies  ?  4.  Potential placenta previa              - inferior margin of placenta within 1 cm of internal os              - will recheck with anatomy scan at 20 weeks

## 2019-04-02 NOTE — Progress Notes (Signed)
Patient is now 19 weeks 6 days. No nausea, lots of ligament pain and lower back and abdominal pain. Lots of pain on her left lower abdomen and tours her back. No bleeding, no spotting and no abnormal discharge.

## 2019-04-02 NOTE — Telephone Encounter (Signed)
Discussed wet prep results and metronidazole sent [facility] pharmacy.

## 2019-04-03 LAB — WET PREP, GENITAL
Spermatozoa: NONE SEEN
Trichomonas: NONE SEEN
Yeast: NONE SEEN

## 2019-04-03 MED ORDER — metroNIDAZOLE (FLAGYL) 500 MG tablet
500 | ORAL_TABLET | Freq: Two times a day (BID) | ORAL | 0 refills | Status: AC
Start: 2019-04-03 — End: 2019-04-09

## 2019-04-09 ENCOUNTER — Institutional Professional Consult (permissible substitution): Admit: 2019-04-09 | Discharge: 2019-04-09 | Primary: MD

## 2019-04-09 ENCOUNTER — Inpatient Hospital Stay: Admit: 2019-04-09 | Discharge: 2019-04-09 | Attending: Maternal & Fetal Medicine | Primary: MD

## 2019-04-09 ENCOUNTER — Ambulatory Visit: Admit: 2019-04-09 | Discharge: 2019-04-09 | Payer: PRIVATE HEALTH INSURANCE | Primary: MD

## 2019-04-09 DIAGNOSIS — O361910 Maternal care for other isoimmunization, first trimester, not applicable or unspecified: Secondary | ICD-10-CM

## 2019-04-09 DIAGNOSIS — Z369 Encounter for antenatal screening, unspecified: Secondary | ICD-10-CM

## 2019-04-09 DIAGNOSIS — O360921 Maternal care for other rhesus isoimmunization, second trimester, fetus 1: Secondary | ICD-10-CM

## 2019-04-09 NOTE — Progress Notes (Signed)
Rachel [name] is a 34 y.o. female who presents today for read only [place] for complete.    BP: 124/62 /   /   / Temp: 36.6 ?C (97.9 ?F)    Body mass index is 27.76 kg/m?Marland Kitchen       Ambulatory Nursing Form     Do you have a cough, fever, shortness of breath or acute illness?  no  If yes, mask was provided to patient for office/clinical exam time period? Yes, all patients are being given masks regardless of symptoms.      Have you traveled within the last 30 days outside the [place]? no  If yes, has testing been obtained? Have you had any contact with individuals confirmed to have COVID-19? no

## 2019-04-14 NOTE — Telephone Encounter (Signed)
I returned Rachel [name]'s phone call and answered all her questions about bladder prolapse.  She will try Kegel exercises, and then we will reevaluate at her 6 week postpartum appointment.

## 2019-04-14 NOTE — Telephone Encounter (Addendum)
Patient thinks her bladder is prolapsing.  She has had this happen twice where it is bulging down and she has pushed it back up.  It is not causing her problems after it is pushed up but wants to talk to the nurse.  The nurse is out for a few days.  Will see if the Dr. Dewayne Hatch her to come in even though she said there is nothing to see. She is pregnant. jh

## 2019-04-23 ENCOUNTER — Ambulatory Visit: Admit: 2019-04-23 | Discharge: 2019-04-23 | Attending: MD | Primary: MD

## 2019-04-23 DIAGNOSIS — Z3A22 22 weeks gestation of pregnancy: Secondary | ICD-10-CM

## 2019-04-23 MED ORDER — ondansetron (ZOFRAN) 8 MG tablet
8 | Freq: Four times a day (QID) | ORAL | 1 refills | Status: DC | PRN
Start: 2019-04-23 — End: 2019-05-28

## 2019-04-23 NOTE — Progress Notes (Signed)
Rachel [name] is a 34 year old [id number] at [redacted]w[redacted]d who presents for a return OB visit.  She has good fetal movement.  She denies any abnormal vaginal discharge, bleeding, or contractions.  She has struggled with dizzy spells from time to time, especially when she stands for an extended period of time.  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    O:  Prenatal Vitals     Enc. Date GA Prenatal Vitals Urine Albumin/Glucose Edema Prenatal Presentation Dilation/Effacement/Station    02/12/19 [redacted]w[redacted]d 118/60 / 168 lb (76.2 kg)         03/04/19 [redacted]w[redacted]d 110/60 / 165 lb (74.8 kg) Negative / Negative None / None  / 148      04/02/19 [redacted]w[redacted]d 118/60 / 171 lb (77.6 kg) Negative / Negative None / None  / 145 / Present      04/09/19 [redacted]w[redacted]d 124/62 / 172 lb (78 kg)         04/23/19 [redacted]w[redacted]d 110/60 / 175 lb (79.4 kg)  None / None 22 cm / 142 / Present      Number of fetuses: 1 Height: 5' 6 (1.676 m)           A/P:  34 year old [id number] at [redacted]w[redacted]d, maternal and fetal well beings are reassured.    1. 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  2. ?Anti-MTa antibodies  ????????????- MFM is following patient  ????????????- monthly growth and MCA evaluation for fetal anemia  ????????????- may cause fetal anemia in late term babies  ?  3. ?Potential placenta previa  ????????????- inferior margin of placenta within 1 cm of internal os  ????????????- anatomy scan showed no previa, anterior placenta?

## 2019-04-23 NOTE — Progress Notes (Signed)
Patient is 22 weeks 6 days. States that in one of her previous pregnancies she would get these episodes of her B/P droping and getting dizzy , light headed . She thinks this is happening again, it happened to her this morning she went pale, got really dizzy and luckily she was familiar with the symptoms that she knew she had to sit down and try to relax so she wouldn't pass out. states she is starting to feel nauseas and would like a prescription of Zofran. No bleeding, no spotting and no abnormal discharge. Baby is moving good.

## 2019-04-30 LAB — CBC WITH AUTO DIFFERENTIAL
Basophils %: 1 % (ref 0–2)
Basophils, Absolute: 0.1 10*3/ÂµL (ref 0.0–0.1)
Eosinophils %: 0 % (ref 0–5)
Eosinophils, Absolute: 0 10*3/ÂµL (ref 0.0–0.4)
HCT: 35.3 % (ref 35.0–48.0)
Hemoglobin: 12.4 g/dL (ref 11.7–16.5)
Lymphocytes %: 16 % — ABNORMAL LOW (ref 20–40)
Lymphocytes, Absolute: 1.6 10*3/ÂµL (ref 1.0–4.0)
MCH: 30.5 pg (ref 28.3–33.3)
MCHC: 35.2 g/dL (ref 32.5–36.0)
MCV: 86.5 fL (ref 81.0–100.0)
MPV: 7.6 fL (ref 6.9–10.0)
Monocytes %: 5 % (ref 2–12)
Monocytes, Absolute: 0.5 10*3/ÂµL (ref 0.2–1.0)
Neutrophils %: 78 % — ABNORMAL HIGH (ref 50–74)
Neutrophils, Absolute: 7.8 10*3/ÂµL — ABNORMAL HIGH (ref 2.0–7.4)
Platelet Count: 154 10*3/ÂµL (ref 150–405)
RBC: 4.08 10*6/ÂµL (ref 3.80–5.60)
RDW: 13.5 % (ref 11.7–16.1)
WBC: 9.9 10*3/??L (ref 4.5–11.0)

## 2019-04-30 LAB — BASIC METABOLIC PANEL
Anion Gap: 15 mmol/L (ref 9.0–18.0)
BUN / Creatinine Ratio: 17.5 (ref 12.0–20.0)
BUN: 7 mg/dL — ABNORMAL LOW (ref 8–20)
CO2 - Carbon Dioxide: 17 mmol/L (ref 17–27)
Calcium: 8.6 mg/dL (ref 8.6–10.6)
Chloride: 105 mmol/L (ref 101.0–111.0)
Creatinine: 0.4 mg/dL — ABNORMAL LOW (ref 0.60–1.30)
Glomerular Filtration Rate Estimate (Female): >90 mL/min/{1.73_m2} (ref >=60)
Glucose: 86 mg/dL (ref 74–106)
Osmolality Calculation: 271.1 mOsm/kg — ABNORMAL LOW (ref 275.0–300.0)
Potassium: 3.74 mmol/L (ref 3.50–5.10)
Sodium: 137 mmol/L (ref 135–145)

## 2019-04-30 LAB — URINALYSIS WITH MICROSCOPIC, CULTURE IF INDICATED
Ascorbic Acid, Urine: NEGATIVE
Bacteria, Urine: NONE SEEN /HPF
Bilirubin, Urine: NEGATIVE
Blood, Urine: NEGATIVE
Glucose, Urine: NEGATIVE mg/dL
Ketones, Urine: NEGATIVE mg/dL
Leukocyte Esterase, Urine: NEGATIVE
Nitrite, Urine: NEGATIVE
Protein, Urine: NEGATIVE mg/dL
RBC, Urine: <1 /HPF (ref 0–4)
Renal/Transitional Epithelial, Urine: 0 /HPF (ref 0–2)
Specific Gravity, Urine: 1.003 — ABNORMAL LOW (ref 1.005–1.030)
Squamous Epithelial, UA: <1 /HPF (ref 0–4)
Urobilinogen, Urine: <2 mg/dL (ref <2.0)
WBC, Urine: 0 /HPF (ref 0–4)
pH, UA: 7 (ref 5.0–7.5)

## 2019-04-30 NOTE — Progress Notes (Signed)
Triage Note    Subjective    Chief Complaint: feeling like I am going to pass out    HPI  Rachel [name] is a 34 y.o. [id number] at [redacted]w[redacted]d with Estimated Date of Delivery: 08/21/19 based on 1st trimester [place]. She has been receiving her prenatal care with Dr. Laurence Compton. 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    She presents with lightheadedness and feeling like she is going to faint. She states she has been experiencing this on and off for the past week, but that she feels worse today. She has had 3 episodes today. When these episodes occur she feels her vision gets tunneled, she gets diaphoretic, and the room spins. Usually if she sits down this resolves but during her most recent episode today it was not getting better. 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 She also works hard to stay hydrated. She drinks around 80 oz of water daily and a cup of tea. She ate 5 strawberries and a meat stick for breakfast this morning. She has had feelings of nausea while eating breakfast recently and was prescribed zofran, but she has not had a chance to pick it up yet. She has had no vomiting or diarrhea. She notes that her BP runs low and that she checked it earlier today and her systolic was in the 90s and her HR got into the one teens. She was admitted for similar symptoms with her last pregnancy, but states they never figured out exactly what was going on and she learned to deal with it for the rest of pregnancy.    She also has had a mild annoying headache this morning that is frontal and bilateral. She has had headaches in the past but does not regularly get them.    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 She is followed by Dr. Park Breed.    She has had 2 echocardiograms in the past that are reassuring. Most recent was 01/29/19 for dyspnea on exertion. Her previous was on 05/2015 for chest  pain/palpitations.    Patient had COVID-19 in December 2020.    She was diagnosed with BV in 04/02/19 and treated with flagyl. She has no vaginal symptoms today.    ROS  ? Positive for fetal movement, headache  ? Negative for contractions, vaginal bleeding, LOF, RUQ pain, vision changes, vaginal discharge, hematuria or dysuria.    Obstetrical History  OB History   Gravida Para Term Preterm AB Living   6 2 2   3 2    SAB TAB Ectopic Multiple Live Births   3     0 2      # Outcome Date GA Lbr Len/2nd Weight Sex Delivery Anes PTL Lv   6 Current            5 Term 07/09/15 [redacted]w[redacted]d  109.5 oz (3104 g) M Vag-Spont EPI N LIV   4 Term 07/16/06 [redacted]w[redacted]d  126 oz (3572 g) F Vag-Spont   LIV   3 SAB            2 SAB            1 SAB               Obstetric Comments   #1:  Pupps disease       Gynecological History  No history of abnormal pap smears, last  pap 06/2017. No history of STIs.     Family History  No history of birth anomalies, coagulopathies, or fetal demise.  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    Non-Hospital Problem List as of 04/30/2019       Non-Hospital    Family history of cancer    PCOS (polycystic ovarian syndrome)    Irregular periods/menstrual cycles    LLQ pain    Multigravida    Overview     01/07/2015 [redacted]w[redacted]d  Normal NT ultrasound.  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  Prior child anemic at birth, but not transfused nor did that child have jaundice.   Paternal testing isn't available.  Titers are uncertain to be helpful (very limited cases).  Will follow monthly MCA PSV.  Hildred Priest, MD, PhD     02/02/2015 [redacted]w[redacted]d MCA PSV = 1.08 MoM.  Reassess in 4 weeks. Hildred Priest, MD, PhD     04/21/2015 [redacted]w[redacted]d Normal growth.  MCA PSV = 1.07MoM.  Reassess in 4 weeks. Hildred Priest, MD, PhD     05/19/2015 [redacted]w[redacted]d Normla growth.  MCA PSV = 0.86MoM.  Reassess MCA  PSV in 2 weeks.  Fetal growth in 4 weeks. Hildred Priest, MD, PhD     06/02/2015 [redacted]w[redacted]d MCA PSV = 0.94 MoM.  Reassess MCA PSV and growth in 2 weeks. Hildred Priest, MD, PhD     06/18/2015 [redacted]w[redacted]d Normal growth and MCA.  Given GA, recommend weekly BPPs. Mechele Collin, MD    07/01/2015 [redacted]w[redacted]d BPP = 8/8. MCA PSV =0.98 MoM.  Continue with weekly biophysical profiles. No further assessments of MCA Dopplers necessary.    ####G6 2020-21#####  02/12/2019 [redacted]w[redacted]d Single viable intrauterine pregnancy with biometry consistent with clinical dates.  Normal nuchal translucency and early anatomy evaluation.  We discussed monitoring strategy similar to her last pregnancy.  Namely, FAS at 20 weeks, then monthly growth and MCA evaluations to start.  Since there has been no meaningful change in her health status the full formal consultation was not repeated. Hildred Priest, MD, PhD     04/09/2019 [redacted]w[redacted]d normal fetal anatomy survey.  Reassessment of fetal growth in 4 weeks and begin MCA PSV screening at that time. Hildred Priest, MD, PhD          Genetic screening    Overview     First Trimester Screen:   Down Syndrome 1 in 5063  Trisomy 18/13  1 in > 10,000  Free Beta hCG 70  Papp-A 30  AFP 95  Results to [facility]  Letter to patient           Chest tightness or pressure    [redacted] weeks gestation of pregnancy    Chest pressure    1st degree AV block          Past medical history, surgical history, medication list, and allergies reviewed and updated as appropriate.    Social History  Patient denies tobacco, alcohol, THC, or illicit drug use. Lives with Husband and 2 children.    Objective    Vitals  BP: (!) 108/59 / P: 90 / T: 36.9 ?C (98.4 ?F) / RR:     Orthostatic Blooed pressures  Date/Time  Temp  Pulse  (Retired) Heart Rate  Resp  BP  SpO2  O2 Flow Rate (L/min)  FiO2 (%)  Weight  PA Cath Temp Who    04/30/19 0923  --  90  --  --  108/59Abnormal   --  --  --  --  -- CY    04/30/19 0917  --  77  --  --  111/57Abnormal   --  --  --  --  -- CY     04/30/19 0911  --  74  --  --  107/59Abnormal   --                   Physical Examination  General: alert, no acute distress  Skin: warm and dry, no rashes  HEENT: pupils equal and equally reactive to light, mucus membranes moist  Pulm: clear to auscultation bilaterally, no wheezes or crackles  Cardiovascular: regular rate and rhythm, no murmurs  Abdomen: nontender gravid abdomen  Lower Extremities: without edema bilaterally    [facility] Investigations  Bedside Ultrasound: deferred  Cervix: deferred  Fetal Heart Tones: baseline 140-150, moderate variability, accelerations absent, decelerations absent  Tocometer: contractions absent, uterine irritability, not felt by mom    Prenatal Labs  Blood Type  O, Rh Positive  Antibody  Negative  Last Hct 04/30/2019: 35.3  Last Hgb 04/30/2019: 12.4  Plt   04/30/2019: 154  Rub   17 (Immune)  RPR   Non-Reactive  HBsAg  Nonreactive  HIV   Negative  TSH   2.25  GC   None  Chlamydia  None    GBS  06/16/2015: Positive for Group B Strep; Group B Streptococci are susceptible to ampicillin, penicillin, and cefazolin, but may be erythromycin and/or clindamycin resistant. Contact laboratory if erythromycin and/or clindamycin testing is necessary.  Urine Culture  02/21/2019: No Growth at 2 days <1000 CFU/mL    Last Ultrasound:  Placenta pos:  Anterior, no previa  EFW:       340 g    Labs  Recent Results (from the past 24 hour(s))   CBC with Auto Differential -STAT    Collection Time: 04/30/19 10:01 AM   Result Value Ref Range    WBC 9.9 4.5 - 11.0 10*3/?L    RBC 4.08 3.80 - 5.60 10*6/?L    Hemoglobin 12.4 11.7 - 16.5 g/dL    HCT 16.1 09.6 - 04.5 %    MCV 86.5 81.0 - 100.0 fL    MCH 30.5 28.3 - 33.3 pg    MCHC 35.2 32.5 - 36.0 g/dL    RDW 40.9 81.1 - 91.4 %    Platelet Count 154 150 - 405 10*3/?L    MPV 7.6 6.9 - 10.0 fL    Neutrophils % 78 (H) 50 - 74 %    Lymphocytes % 16 (L) 20 - 40 %    Monocytes % 5 2 - 12 %    Eosinophils % 0 0 - 5 %    Basophils % 1 0 - 2 %    Neutrophils,  Absolute 7.8 (H) 2.0 - 7.4 10*3/?L    Lymphocytes, Absolute 1.6 1.0 - 4.0 10*3/?L    Monocytes, Absolute 0.5 0.2 - 1.0 10*3/?L    Eosinophils, Absolute 0.0 0.0 - 0.4 10*3/?L    Basophils, Absolute 0.1 0.0 - 0.1 10*3/?L   Basic Metabolic Panel -STAT    Collection Time: 04/30/19 10:01 AM   Result Value Ref Range    Sodium 137 135 - 145 mmol/L  Potassium 3.74 3.50 - 5.10 mmol/L    Chloride 105.0 101.0 - 111.0 mmol/L    CO2 - Carbon Dioxide 17 17 - 27 mmol/L    BUN 7 (L) 8 - 20 mg/dL    Creatinine 8.41 (L) 0.60 - 1.30 mg/dL    Glucose 86 74 - 324 mg/dL    Calcium 8.6 8.6 - 40.1 mg/dL    Anion Gap 02.7 9.0 - 18.0 mmol/L    Glomerular Filtration Rate Estimate (Female) >90 >=60 mL/min/1.36m*2    GFR Additional Info      BUN / Creatinine Ratio 17.5 12.0 - 20.0    Osmolality Calculation 271.1 (L) 275.0 - 300.0 mOsm/kg   Urinalysis with Microscopic, Culture if Indicated -STAT Urine Urine, Void: Clean Catch (mid stream)    Collection Time: 04/30/19 10:13 AM    Specimen: Urine, Void: Clean Catch (mid stream)   Result Value Ref Range    Color, Urine Straw Colorless, Straw, Yellow, Dark Yellow    Clarity, Urine Clear Clear    Glucose, Urine Negative Negative mg/dL    Bilirubin, Urine Negative Negative    Ketones, Urine Negative Negative mg/dL    Specific Gravity, Urine 1.003 (L) 1.005 - 1.030    Blood, Urine Negative Negative    pH, UA 7.0 5.0 - 7.5    Protein, Urine Negative Negative mg/dL    Urobilinogen, Urine <2.0 <2.0 mg/dL    Nitrite, Urine Negative Negative    Leukocyte Esterase, Urine Negative Negative    Ascorbic Acid, Urine Negative Negative    WBC, Urine 0  0 - 4 /HPF    RBC, Urine <1 0 - 4 /HPF    Bacteria, Urine None Seen 0 /HPF    Squamous Epithelial, UA <1 0 - 4 /HPF    Renal/Transitional Epithelial, Urine 0  0 - 2 /HPF       Assessment / Plan    Rachel [name] is a 34 y.o. female [phone] here at [redacted]w[redacted]d for lightheadedness/dizziness. 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    #Orthostatic  Presyncope  Patient with lightheadedness and dizziness that comes and goes over past week. She has had 2 reassuring echos which makes cardiac etiology less likely, given presentation and history low suspicion for PE. Initial DDX includes: orthostatic hypotension, infection, dehydration, hypoglycemia. Very unlikely PE with normal vitals, lack of cp/sob, or leg swelling. Given reassuring labs this is most likely orthostatic hypotension secondary to hemodynamic changes in pregnancy. Discussed this with patient who understands. She was feeling much better on reexamination and was not experiencing any symptoms. Patient comfortable with going home. Patient given red flag symptoms for return.  -Orthostatic BP monitoring Negative. (see vitals listed above)  -Oral hydration-good oral intake.  -UA- WNL  -CBC- WNL  -BMP- WNL    Patient will be discharged home in stable condition with instructions to follow up with her provider. Advised patient to pace herself with activity.    All maternal questions answered and mom is aware that she can call the [facility] or her provider if she has further questions.     Dr. Laurence Compton notified and agrees with the above plan.    Val Eagle, MD  04/30/2019, 11:20 AM     This note was written with the assistance of a medical student: Erin Fulling, MS4.

## 2019-05-07 ENCOUNTER — Institutional Professional Consult (permissible substitution): Admit: 2019-05-07 | Discharge: 2019-05-07 | Primary: MD

## 2019-05-07 ENCOUNTER — Inpatient Hospital Stay: Admit: 2019-05-07 | Discharge: 2019-05-07 | Attending: Maternal & Fetal Medicine | Primary: MD

## 2019-05-07 ENCOUNTER — Ambulatory Visit: Admit: 2019-05-07 | Discharge: 2019-05-07 | Payer: PRIVATE HEALTH INSURANCE | Primary: MD

## 2019-05-07 DIAGNOSIS — Z3689 Encounter for other specified antenatal screening: Secondary | ICD-10-CM

## 2019-05-07 DIAGNOSIS — O361920 Maternal care for other isoimmunization, second trimester, not applicable or unspecified: Secondary | ICD-10-CM

## 2019-05-07 NOTE — Progress Notes (Signed)
Rachel [name] is a 34 y.o. female who presents today for doctor visit and [place] for growth.    BP: 120/60 /   /   / Temp: 36.6 ?C (97.9 ?F)    Body mass index is 29.45 kg/m?Marland Kitchen   Ambulatory Nursing Form     Do you have a cough, fever, shortness of breath or acute illness?  no  If yes, mask was provided to patient for office/clinical exam time period? Yes, all patients are being given masks regardless of symptoms.      Have you traveled within the last 30 days outside the [place]? no  If yes, has testing been obtained? Have you had any contact with individuals confirmed to have COVID-19? no

## 2019-05-16 ENCOUNTER — Ambulatory Visit: Admit: 2019-05-16 | Discharge: 2019-05-16 | Attending: MD | Primary: MD

## 2019-05-16 DIAGNOSIS — Z3482 Encounter for supervision of other normal pregnancy, second trimester: Secondary | ICD-10-CM

## 2019-05-16 LAB — CBC WITH AUTO DIFFERENTIAL
Basophils %: 0 % (ref 0–2)
Basophils, Absolute: 0 10*3/ÂµL (ref 0.0–0.1)
Eosinophils %: 0 % (ref 0–5)
Eosinophils, Absolute: 0 10*3/ÂµL (ref 0.0–0.4)
HCT: 34.9 % — ABNORMAL LOW (ref 35.0–48.0)
Hemoglobin: 12.1 g/dL (ref 11.7–16.5)
Lymphocytes %: 16 % — ABNORMAL LOW (ref 20–40)
Lymphocytes, Absolute: 1.4 10*3/ÂµL (ref 1.0–4.0)
MCH: 29.9 pg (ref 28.3–33.3)
MCHC: 34.6 g/dL (ref 32.5–36.0)
MCV: 86.5 fL (ref 81.0–100.0)
MPV: 8 fL (ref 6.9–10.0)
Monocytes %: 6 % (ref 2–12)
Monocytes, Absolute: 0.5 10*3/ÂµL (ref 0.2–1.0)
Neutrophils %: 78 % — ABNORMAL HIGH (ref 50–74)
Neutrophils, Absolute: 7 10*3/ÂµL (ref 2.0–7.4)
Platelet Count: 143 10*3/ÂµL — ABNORMAL LOW (ref 150–405)
RBC: 4.03 10*6/ÂµL (ref 3.80–5.60)
RDW: 12.9 % (ref 11.7–16.1)
WBC: 9.1 10*3/ÂµL (ref 4.5–11.0)

## 2019-05-16 LAB — GLUCOSE, 1 HOUR PERFORMABLE ONLY: OB Glucose, GTT - 1 Hour: 87 mg/dL (ref 50–140)

## 2019-05-16 LAB — HEPATIC FUNCTION PANEL
ALT - Alanine Aminotransferase: 8 IU/L (ref 5–33)
AST - Aspartate Aminotransferase: 13 IU/L (ref 5–32)
Albumin: 3.4 g/dL — ABNORMAL LOW (ref 3.5–5.0)
Alkaline Phosphatase: 63 IU/L (ref 35–105)
Bilirubin Direct: <0.2 mg/dL (ref 0.0–0.3)
Bilirubin Total: 0.2 mg/dL (ref 0.10–1.70)
Protein Total: 6 g/dL — ABNORMAL LOW (ref 6.4–8.2)

## 2019-05-16 NOTE — Progress Notes (Signed)
Rachel [name] consumed her 50 grams of glucola at 0821, I drew her blood from her Left AC at 0930. She tolerated procedure well.

## 2019-05-16 NOTE — Progress Notes (Signed)
Patient is 26 weeks 1 day. No contractions, no bleeding, no spotting, no leaking of fluids, still having yellow discharge with no smell. Has been having pain under her ribs for on the right side, headaches more frequently, feels sore even when baby isn't moving. Patient feels like she is swelling.

## 2019-05-16 NOTE — Progress Notes (Signed)
Rachel [name] is a 33 year old G6P2032 at [redacted]w[redacted]d who presents for a return OB visit.  She has good fetal movement.  She denies any abnormal vaginal discharge, bleeding, or contractions.  She did her 1 hour glucola test today.    O:    Prenatal Vitals     Enc. Date GA Prenatal Vitals Urine Albumin/Glucose Edema Prenatal Presentation Dilation/Effacement/Station    02/12/19 [redacted]w[redacted]d 118/60 / 168 lb (76.2 kg)         03/04/19 [redacted]w[redacted]d 110/60 / 165 lb (74.8 kg) Negative / Negative None / None  / 148      04/02/19 [redacted]w[redacted]d 118/60 / 171 lb (77.6 kg) Negative / Negative None / None  / 145 / Present      04/09/19 [redacted]w[redacted]d 124/62 / 172 lb (78 kg)         04/23/19 [redacted]w[redacted]d 110/60 / 175 lb (79.4 kg)  None / None 22 cm / 142 / Present      04/30/19 [redacted]w[redacted]d Admission Dept: [facility]    05/07/19 [redacted]w[redacted]d 120/60 / 177 lb (80.3 kg)         05/16/19 [redacted]w[redacted]d 110/60 / 178 lb (80.7 kg)  None / None 25 cm / 140 / Present      Number of fetuses: 1 Height: 5' 5 (1.651 m)           A/P:  34 year old G6P2032 at [redacted]w[redacted]d, fetal well being is reassuring.    1. 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  2. ?Anti-MTa antibodies  ????????????- MFM is following patient  ????????????- monthly growth and MCA evaluation for fetal anemia  ????????????- may cause fetal anemia in late term babies  ?  3. ?Potential placenta previa  ????????????- inferior margin of placenta within 1 cm of internal os  ????????????- anatomy scan showed no previa, anterior placenta?

## 2019-05-16 NOTE — Progress Notes (Signed)
Patient drank 50 gram glucola drink at 0821. Will get blood drawn at 0921.

## 2019-05-28 ENCOUNTER — Encounter: Attending: MD | Primary: MD

## 2019-05-28 ENCOUNTER — Ambulatory Visit: Admit: 2019-05-28 | Discharge: 2019-05-28 | Attending: FNP | Primary: MD

## 2019-05-28 ENCOUNTER — Other Ambulatory Visit: Admit: 2019-05-28 | Primary: MD

## 2019-05-28 DIAGNOSIS — R82998 Other abnormal findings in urine: Secondary | ICD-10-CM

## 2019-05-28 DIAGNOSIS — Z3A27 27 weeks gestation of pregnancy: Secondary | ICD-10-CM

## 2019-05-28 LAB — URINALYSIS, MACROSCOPIC (DIP ONLY) (SLM POC)
Bilirubin, Urine: NEGATIVE
Blood, Urine: NEGATIVE
Glucose, Urine: NEGATIVE mg/dL
Ketones, Urine: NEGATIVE mg/dL
Nitrite, Urine: NEGATIVE
Protein, Urine: NEGATIVE mg/dL
Specific Gravity, Urine: 1.01 (ref 1.005–1.030)
Urobilinogen, Urine: 0.2 mg/dL (ref 0.0–2.0)
pH, UA: 7 (ref 5.0–7.5)

## 2019-05-28 LAB — URINE CULTURE (SLM)

## 2019-05-28 NOTE — Progress Notes (Signed)
Rachel [name] is a 34 year old G6P2032 at [redacted]w[redacted]d who presents for a return OB visit.  She has good fetal movement.  She denies any abnormal vaginal discharge, bleeding, or contractions.      O:  Prenatal Vitals     Enc. Date GA Prenatal Vitals Urine Albumin/Glucose Edema Prenatal Presentation Dilation/Effacement/Station    02/12/19 [redacted]w[redacted]d 118/60 / 168 lb (76.2 kg)         03/04/19 [redacted]w[redacted]d 110/60 / 165 lb (74.8 kg) Negative / Negative None / None  / 148      04/02/19 [redacted]w[redacted]d 118/60 / 171 lb (77.6 kg) Negative / Negative None / None  / 145 / Present      04/09/19 [redacted]w[redacted]d 124/62 / 172 lb (78 kg)         04/23/19 [redacted]w[redacted]d 110/60 / 175 lb (79.4 kg)  None / None 22 cm / 142 / Present      04/30/19 [redacted]w[redacted]d Admission Dept: [facility]    05/07/19 [redacted]w[redacted]d 120/60 / 177 lb (80.3 kg)         05/16/19 [redacted]w[redacted]d 110/60 / 178 lb (80.7 kg)  None / None 25 cm / 140 / Present      05/28/19 [redacted]w[redacted]d 122/64 / 180 lb (81.6 kg) Negative / Negative None / None  / 150 / Present      Number of fetuses: 1 Height: 5' 5 (1.651 m)           A/P:  34 year old G6P2032 at [redacted]w[redacted]d, fetal well being is reassuring.  ?  1. 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  ?  2. ?Anti-MTa antibodies  ????????????- MFM is following patient  ????????????- monthly growth and MCA evaluation for fetal anemia  ????????????- may cause fetal anemia in late term babies  ?  3. ?Potential placenta previa  ????????????- inferior margin of placenta within 1 cm of internal os  ????????????-?anatomy scan showed no previa, anterior placenta?    Hal Neer, FNP

## 2019-05-28 NOTE — Progress Notes (Signed)
Patient is [redacted]w[redacted]d she has no bleeding, spotting. She does have slight yellow discharge that she is not concerned about. Patient has no other concern or complaints.

## 2019-06-06 ENCOUNTER — Ambulatory Visit: Admit: 2019-06-06 | Discharge: 2019-06-06 | Payer: PRIVATE HEALTH INSURANCE | Primary: MD

## 2019-06-06 ENCOUNTER — Inpatient Hospital Stay: Admit: 2019-06-06 | Discharge: 2019-06-06 | Attending: Maternal & Fetal Medicine | Primary: MD

## 2019-06-06 ENCOUNTER — Institutional Professional Consult (permissible substitution): Admit: 2019-06-06 | Discharge: 2019-06-06 | Primary: MD

## 2019-06-06 DIAGNOSIS — Z3689 Encounter for other specified antenatal screening: Secondary | ICD-10-CM

## 2019-06-06 NOTE — Progress Notes (Signed)
Rachel [name] is a 34 y.o. female who presents today for growth ultrasound.    BP: (!) 117/56 /   /   / Temp: 36.8 ?C (98.2 ?F)    Body mass index is 30.22 kg/m?Marland Kitchen

## 2019-06-18 ENCOUNTER — Ambulatory Visit: Admit: 2019-06-18 | Discharge: 2019-06-18 | Attending: MD | Primary: MD

## 2019-06-18 ENCOUNTER — Other Ambulatory Visit: Admit: 2019-06-18 | Primary: MD

## 2019-06-18 DIAGNOSIS — Z3483 Encounter for supervision of other normal pregnancy, third trimester: Secondary | ICD-10-CM

## 2019-06-18 DIAGNOSIS — Z3A30 30 weeks gestation of pregnancy: Secondary | ICD-10-CM

## 2019-06-18 LAB — WET PREP, GENITAL
Clue Cells: NONE SEEN
Spermatozoa: NONE SEEN
Trichomonas: NONE SEEN
Yeast: NONE SEEN

## 2019-06-18 LAB — URINALYSIS, MACROSCOPIC (DIP ONLY) (SLM POC)
Bilirubin, Urine: NEGATIVE
Blood, Urine: NEGATIVE
Glucose, Urine: NEGATIVE mg/dL
Ketones, Urine: NEGATIVE mg/dL
Nitrite, Urine: NEGATIVE
Protein, Urine: NEGATIVE mg/dL
Specific Gravity, Urine: 1.02 (ref 1.005–1.030)
Urobilinogen, Urine: 0.2 mg/dL (ref 0.0–2.0)
pH, UA: 6 (ref 5.0–7.5)

## 2019-06-18 LAB — FETAL FIBRONECTIN: Fetal Fibronectin: NEGATIVE

## 2019-06-18 NOTE — Progress Notes (Signed)
Rachel [name] is a 34 year old G6P2032 at [redacted]w[redacted]d who presents for a return OB visit.  She has good fetal movement.  She has a yellowish vaginal discharge with a mild odor which makes her think she might have bacterial vaginosis.  She denies any vaginal bleeding.  She has an occasional Braxton-Hicks contraction, but they are mild and intermittent.  She has noticed an increased pelvic pressure.    O:    Prenatal Vitals     Enc. Date GA Prenatal Vitals Urine Albumin/Glucose Edema Prenatal Presentation Dilation/Effacement/Station    02/12/19 [redacted]w[redacted]d 118/60 / 168 lb (76.2 kg)         03/04/19 [redacted]w[redacted]d 110/60 / 165 lb (74.8 kg) Negative / Negative None / None  / 148      04/02/19 [redacted]w[redacted]d 118/60 / 171 lb (77.6 kg) Negative / Negative None / None  / 145 / Present      04/09/19 [redacted]w[redacted]d 124/62 / 172 lb (78 kg)         04/23/19 [redacted]w[redacted]d 110/60 / 175 lb (79.4 kg)  None / None 22 cm / 142 / Present      04/30/19 [redacted]w[redacted]d Admission Dept: [facility]    05/07/19 [redacted]w[redacted]d 120/60 / 177 lb (80.3 kg)         05/16/19 [redacted]w[redacted]d 110/60 / 178 lb (80.7 kg)  None / None 25 cm / 140 / Present      05/28/19 [redacted]w[redacted]d 122/64 / 180 lb (81.6 kg) Negative / Negative None / None  / 150 / Present      06/06/19 [redacted]w[redacted]d 117/56 (A) / 181 lb 9.6 oz (82.4 kg)         06/18/19 [redacted]w[redacted]d 116/68 / 182 lb (82.6 kg) Negative / Negative None / None 30 cm / 148 / Present  0 / 0 / -3    Number of fetuses: 1 Height: 5' 5 (1.651 m)           A/P:  34 year old G6P2032 at [redacted]w[redacted]d, fetal well being is reassuring.    1. ?IUP - PTL precautions and kick counts were reviewed  ????????????- anatomy scan was normal, with consistent dates  ????????????- works as Agricultural engineer               - pt passed her 1 hour glucola test    2.  Yellow vaginal discharge   - FFN collected today was negative   - Wet mount today was benign  ?  3. ?Anti-MTa antibodies  ????????????- MFM is following patient  ????????????- monthly growth and MCA evaluation for fetal anemia   - may cause fetal anemia in late term babies   - MCA doppler was normal on  06/06/19, normal growth  ?  4. ?Potential placenta previa  ????????????- inferior margin of placenta within 1 cm of internal os  ????????????-?anatomy scan showed no previa, anterior placenta?

## 2019-06-18 NOTE — Progress Notes (Signed)
Patient is 30 weeks 6 days. States she is very painful, her crotch hurts really bad. No leaking, no bleeding, no spotting and having lower back pain everything feels swollen. The discharge doesn't doesn't have an odor but is still yellow. The last 2 days she has been feeling very crampy. Concerned that her urine is dark and she stays very hydrated and mostly only drinks water. Baby is moving good.

## 2019-06-19 ENCOUNTER — Encounter: Payer: PRIVATE HEALTH INSURANCE | Primary: MD

## 2019-06-20 NOTE — Telephone Encounter (Signed)
Patient called to let the Dr. know that she got her DTAP yesterday.  She will bring in the card at her next appointment. jh

## 2019-06-20 NOTE — Telephone Encounter (Signed)
Noted  

## 2019-07-02 ENCOUNTER — Ambulatory Visit: Admit: 2019-07-02 | Discharge: 2019-07-02 | Attending: MD | Primary: MD

## 2019-07-02 NOTE — Progress Notes (Signed)
Patient is 32 weeks 6 days. Received her TDAP at work. Still having discharge , no bleeding, no spotting and no leaking of fluid. Baby moving good.

## 2019-07-02 NOTE — Progress Notes (Signed)
Rachel [name] is a 34 year old G6P2032 at [redacted]w[redacted]d who presents for a return OB visit.  She has good fetal movement.  She denies any abnormal vaginal discharge, bleeding, or contractions.  While she does have increased vaginal discharge, there is no vaginal irritation or odor.  Her wet mount last visit was normal.  She had her TDaP vaccine at her work.    O:  Prenatal Vitals     Enc. Date GA Prenatal Vitals Urine Albumin/Glucose Edema Prenatal Presentation Dilation/Effacement/Station    02/12/19 [redacted]w[redacted]d 118/60 / 168 lb (76.2 kg)         03/04/19 [redacted]w[redacted]d 110/60 / 165 lb (74.8 kg) Negative / Negative None / None  / 148      04/02/19 [redacted]w[redacted]d 118/60 / 171 lb (77.6 kg) Negative / Negative None / None  / 145 / Present      04/09/19 [redacted]w[redacted]d 124/62 / 172 lb (78 kg)         04/23/19 [redacted]w[redacted]d 110/60 / 175 lb (79.4 kg)  None / None 22 cm / 142 / Present      04/30/19 [redacted]w[redacted]d Admission Dept: [facility]    05/07/19 [redacted]w[redacted]d 120/60 / 177 lb (80.3 kg)         05/16/19 [redacted]w[redacted]d 110/60 / 178 lb (80.7 kg)  None / None 25 cm / 140 / Present      05/28/19 [redacted]w[redacted]d 122/64 / 180 lb (81.6 kg) Negative / Negative None / None  / 150 / Present      06/06/19 [redacted]w[redacted]d 117/56 (A) / 181 lb 9.6 oz (82.4 kg)         06/18/19 [redacted]w[redacted]d 116/68 / 182 lb (82.6 kg) Negative / Negative None / None 30 cm / 148 / Present  0 / 0 / -3    07/02/19 [redacted]w[redacted]d 102/60 / 184 lb (83.5 kg)  None / None 32 cm / 138 / Present      Number of fetuses: 1 Height: 5' 5 (1.651 m)           A/P:  34 year old G6P2032 at [redacted]w[redacted]d, fetal well being is reassuring.    1. ?IUP - PTL precautions and kick counts were reviewed  ????????????- anatomy scan was normal, with consistent dates  ????????????- works as Agricultural engineer?  ????????????- pt passed her 1 hour glucola test   - pt already had her TDaP vaccine  ?  2. ?Anti-MTa antibodies  ????????????- MFM is following patient  ????????????- monthly growth and MCA evaluation for fetal anemia              - may cause fetal anemia in late term babies              - MCA doppler was normal on 06/06/19,  normal growth   - next ultrasound on 07/09/19  ?  3. ?Potential placenta previa  ????????????- inferior margin of placenta within 1 cm of internal os  ????????????-?anatomy scan showed no previa, anterior placenta?

## 2019-07-09 ENCOUNTER — Ambulatory Visit: Admit: 2019-07-09 | Discharge: 2019-07-09 | Payer: PRIVATE HEALTH INSURANCE | Primary: MD

## 2019-07-09 ENCOUNTER — Inpatient Hospital Stay: Admit: 2019-07-09 | Discharge: 2019-07-09 | Attending: Maternal & Fetal Medicine | Primary: MD

## 2019-07-09 ENCOUNTER — Institutional Professional Consult (permissible substitution): Admit: 2019-07-09 | Discharge: 2019-07-09 | Primary: MD

## 2019-07-09 DIAGNOSIS — O361930 Maternal care for other isoimmunization, third trimester, not applicable or unspecified: Secondary | ICD-10-CM

## 2019-07-09 NOTE — Progress Notes (Signed)
Rachel [name] is a 34 y.o. female who presents today for doctor visit and [place] for growth.    BP: 124/60 /   /   / Temp: 36.7 ?C (98 ?F)    Body mass index is 31.12 kg/m?Marland Kitchen       Ambulatory Nursing Form     Do you have a cough, fever, shortness of breath or acute illness?  no  If yes, mask was provided to patient for office/clinical exam time period? Yes, all patients are being given masks regardless of symptoms.      Have you traveled within the last 30 days outside the [place]? no  If yes, has testing been obtained? Have you had any contact with individuals confirmed to have COVID-19? no

## 2019-07-10 NOTE — Telephone Encounter (Signed)
Patient saw Dr. Park Breed yesterday and he said that the baby's numbers are going up, which means the baby is anemic.  Per Dr. Laurence Compton we can move her appointment up to tomorrow. jh

## 2019-07-11 ENCOUNTER — Ambulatory Visit: Admit: 2019-07-11 | Discharge: 2019-07-11 | Attending: MD | Primary: MD

## 2019-07-11 DIAGNOSIS — O361930 Maternal care for other isoimmunization, third trimester, not applicable or unspecified: Secondary | ICD-10-CM

## 2019-07-11 NOTE — Progress Notes (Signed)
Patient is 34 weeks 1 day. Denies any spotting, bleeding, or leaking of fluids. No contractions.

## 2019-07-11 NOTE — Progress Notes (Signed)
Zalea is a 35 year old [id number] at [redacted]w[redacted]d who presents for a return OB visit.  She has good fetal movement.  She denies any abnormal vaginal discharge, bleeding, or contractions.  She is very stressed today because she had an MFM appointment, and was told her MCA dopplers are changing and she needs to be delivered at 37 weeks.    O:    Prenatal Vitals     Enc. Date GA Prenatal Vitals Urine Albumin/Glucose Edema Prenatal Presentation Dilation/Effacement/Station    02/12/19 [redacted]w[redacted]d 118/60 / 168 lb (76.2 kg)         03/04/19 [redacted]w[redacted]d 110/60 / 165 lb (74.8 kg) Negative / Negative None / None  / 148      04/02/19 [redacted]w[redacted]d 118/60 / 171 lb (77.6 kg) Negative / Negative None / None  / 145 / Present      04/09/19 [redacted]w[redacted]d 124/62 / 172 lb (78 kg)         04/23/19 [redacted]w[redacted]d 110/60 / 175 lb (79.4 kg)  None / None 22 cm / 142 / Present      04/30/19 [redacted]w[redacted]d Admission Dept: [facility]    05/07/19 [redacted]w[redacted]d 120/60 / 177 lb (80.3 kg)         05/16/19 [redacted]w[redacted]d 110/60 / 178 lb (80.7 kg)  None / None 25 cm / 140 / Present      05/28/19 [redacted]w[redacted]d 122/64 / 180 lb (81.6 kg) Negative / Negative None / None  / 150 / Present      06/06/19 [redacted]w[redacted]d 117/56 (A) / 181 lb 9.6 oz (82.4 kg)         06/18/19 [redacted]w[redacted]d 116/68 / 182 lb (82.6 kg) Negative / Negative None / None 30 cm / 148 / Present  0 / 0 / -3    07/02/19 [redacted]w[redacted]d 102/60 / 184 lb (83.5 kg)  None / None 32 cm / 138 / Present      07/09/19 [redacted]w[redacted]d 124/60 / 187 lb (84.8 kg)         07/11/19 [redacted]w[redacted]d 108/60 / 185 lb (83.9 kg)  None / None 34 cm / 140 / Present      Number of fetuses: 1 Height: 5' 5 (1.651 m)           A/P:  34 year old G6P2032 at [redacted]w[redacted]d with Anti-MTa antibodies and changing MCA dopplers, fetal well being is reassuring.      1. 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  ????????????-?pt passed her 1 hour glucola test              - pt already had her TDaP vaccine  ?  2. ?Anti-MTa antibodies  ????????????- MFM is following  patient  ????????????- monthly growth and MCA evaluation for fetal anemia  ????????????- may cause fetal anemia in late term babies  ????????????- MCA doppler was normal on 06/06/19, normal growth              - Ultrasound on 07/09/19 - BPP was reassuring.  While MCA      Doppler is normal, the trend is increasing (1.37 MoM).  Dr. Park Breed      with MFM recommended weekly BPP and MCA doppler each      week and IOL at 37 weeks.   - IOL is set up for 07/30/19 at 10 pm for cervical ripening.

## 2019-07-15 ENCOUNTER — Institutional Professional Consult (permissible substitution): Admit: 2019-07-15 | Discharge: 2019-07-15 | Primary: MD

## 2019-07-15 ENCOUNTER — Ambulatory Visit: Admit: 2019-07-15 | Discharge: 2019-07-15 | Payer: PRIVATE HEALTH INSURANCE | Primary: MD

## 2019-07-15 ENCOUNTER — Inpatient Hospital Stay: Admit: 2019-07-15 | Discharge: 2019-07-15 | Attending: Maternal & Fetal Medicine | Primary: MD

## 2019-07-15 DIAGNOSIS — Z369 Encounter for antenatal screening, unspecified: Secondary | ICD-10-CM

## 2019-07-15 DIAGNOSIS — O361930 Maternal care for other isoimmunization, third trimester, not applicable or unspecified: Secondary | ICD-10-CM

## 2019-07-15 LAB — CBC WITH AUTO DIFFERENTIAL
Basophils %: 0 % (ref 0–2)
Basophils, Absolute: 0 10*3/ÂµL (ref 0.0–0.1)
Eosinophils %: 0 % (ref 0–5)
Eosinophils, Absolute: 0 10*3/ÂµL (ref 0.0–0.4)
HCT: 30.8 % — ABNORMAL LOW (ref 35.0–48.0)
Hemoglobin: 10.4 g/dL — ABNORMAL LOW (ref 11.7–16.5)
Lymphocytes %: 18 % — ABNORMAL LOW (ref 20–40)
Lymphocytes, Absolute: 1.9 10*3/ÂµL (ref 1.0–4.0)
MCH: 27.8 pg — ABNORMAL LOW (ref 28.3–33.3)
MCHC: 33.8 g/dL (ref 32.5–36.0)
MCV: 82.4 fL (ref 81.0–100.0)
MPV: 7.9 fL (ref 6.9–10.0)
Monocytes %: 6 % (ref 2–12)
Monocytes, Absolute: 0.6 10*3/ÂµL (ref 0.2–1.0)
Neutrophils %: 76 % — ABNORMAL HIGH (ref 50–74)
Neutrophils, Absolute: 7.7 10*3/ÂµL — ABNORMAL HIGH (ref 2.0–7.4)
Platelet Count: 115 10*3/ÂµL — ABNORMAL LOW (ref 150–405)
RBC: 3.73 10*6/ÂµL — ABNORMAL LOW (ref 3.80–5.60)
RDW: 14.2 % (ref 11.7–16.1)
WBC: 10.2 10*3/ÂµL (ref 4.5–11.0)

## 2019-07-15 LAB — COMPREHENSIVE METABOLIC PANEL
ALT - Alanine Aminotransferase: 9 IU/L (ref 5–33)
AST - Aspartate Aminotransferase: 18 IU/L (ref 5–32)
Albumin/Globulin Ratio: 1.4 (ref 1.2–2.2)
Albumin: 3.3 g/dL — ABNORMAL LOW (ref 3.5–5.0)
Alkaline Phosphatase: 71 IU/L (ref 35–105)
Anion Gap: 10.5 mmol/L (ref 9.0–18.0)
BUN / Creatinine Ratio: 15.8 (ref 12.0–20.0)
BUN: 6 mg/dL — ABNORMAL LOW (ref 8–20)
Bilirubin Total: 0.4 mg/dL (ref 0.10–1.70)
CO2 - Carbon Dioxide: 22 mmol/L (ref 17–27)
Calcium: 8.1 mg/dL — ABNORMAL LOW (ref 8.6–10.6)
Chloride: 102.5 mmol/L (ref 101.0–111.0)
Creatinine: 0.38 mg/dL — ABNORMAL LOW (ref 0.60–1.30)
Glomerular Filtration Rate Estimate (Female): >90 mL/min/{1.73_m2} (ref >=60)
Glucose: 78 mg/dL (ref 74–106)
Osmolality Calculation: 266.6 mOsm/kg — ABNORMAL LOW (ref 275.0–300.0)
Potassium: 3.59 mmol/L (ref 3.50–5.10)
Protein Total: 5.7 g/dL — ABNORMAL LOW (ref 6.4–8.2)
Sodium: 135 mmol/L (ref 135–145)

## 2019-07-15 MED ORDER — omeprazole magnesium (PRILOSEC OTC) 20 MG tablet
20 | Freq: Every day | ORAL | 0 refills | 90.00000 days | Status: DC
Start: 2019-07-15 — End: 2019-08-02

## 2019-07-15 MED ORDER — acetaminophen (TYLENOL) tablet 650 mg
325 | Freq: Once | ORAL | Status: AC
Start: 2019-07-15 — End: 2019-07-15
  Administered 2019-07-15: 18:00:00 325 mg via ORAL

## 2019-07-15 NOTE — Progress Notes (Signed)
Triage Note    Subjective    Chief Complaint: Constant pain, epigastric and radiating    HPI  Rachel [name] is a 34 y.o. [id number] at [redacted]w[redacted]d with Estimated Date of Delivery: 08/21/19 based on 1st trimester [place]. She has been receiving her prenatal care with Dr. Laurence Compton. 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    She presents with pain in her epigastric area that follows her ribcage, down her sides and into her low back. It started in the middle but has now moved over to the R side. The pain seems to be located around her lowest rib, but does go all the way to her back along that rib. Some radiation of the pain down into her low back. She would rate the pain as 5/10 and constant.     She has had heartburn in pregnancy and states milk is her best friend. The pain does not get better or worse during or after eating. She has noticed a few random contractions in the last few days and does not think she is in labor. The pain does not come or go with the contractions. She has had her gallbladder removed in the past. She has not had much of an appetite the last few days but has been drinking plenty of water. She has not taken anything for the pain.    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 She is followed by Dr. Park Breed. Patient had COVID-19 in December 2020. She was diagnosed with BV in 04/02/19 and treated with flagyl. She has no vaginal symptoms today.    ROS  ? Positive for fetal movement, contractions  ? Negative for vaginal bleeding, LOF, RUQ pain, vision changes, headache, vaginal discharge, hematuria or dysuria    Obstetrical History  OB History   Gravida Para Term Preterm AB Living   6 2 2   3 2    SAB TAB Ectopic Multiple Live Births   3     0 2      # Outcome Date GA Lbr Len/2nd Weight Sex Delivery Anes PTL Lv   6 Current            5 Term 07/09/15 [redacted]w[redacted]d  3.104 kg (6 lb 13.5 oz) M Vag-Spont EPI N LIV   4 Term 07/16/06 [redacted]w[redacted]d  3.572 kg (7 lb 14  oz) F Vag-Spont   LIV   3 SAB            2 SAB            1 SAB               Obstetric Comments   #1:  Pupps disease       Gynecological History  No history of abnormal pap smears, last pap 06/2017. No history of STIs.    Family History  No history of birth anomalies, coagulopathies, or fetal demise.                    Non-Hospital Problem List as of 07/15/2019       Non-Hospital    Family history of cancer    PCOS (polycystic ovarian syndrome)    Irregular periods/menstrual cycles    LLQ pain    Multigravida    Overview     01/07/2015 [redacted]w[redacted]d  Normal NT ultrasound.  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  Prior child anemic at birth, but not transfused nor did that child have jaundice.   Paternal testing isn't available.  Titers are uncertain to be helpful (very limited cases).  Will follow monthly MCA PSV.  Hildred Priest, MD, PhD   02/02/2015 [redacted]w[redacted]d MCA PSV = 1.08 MoM.  Reassess in 4 weeks. Hildred Priest, MD, PhD   04/21/2015 [redacted]w[redacted]d Normal growth.  MCA PSV = 1.07MoM.  Reassess in 4 weeks. Hildred Priest, MD, PhD   05/19/2015 [redacted]w[redacted]d Normla growth.  MCA PSV = 0.86MoM.  Reassess MCA PSV in 2 weeks.  Fetal growth in 4 weeks. Hildred Priest, MD, PhD   06/02/2015 [redacted]w[redacted]d MCA PSV = 0.94 MoM.  Reassess MCA PSV and growth in 2 weeks. Hildred Priest, MD, PhD   06/18/2015 [redacted]w[redacted]d Normal growth and MCA.  Given GA, recommend weekly BPPs. Mechele Collin, MD  07/01/2015 [redacted]w[redacted]d BPP = 8/8. MCA PSV =0.98 MoM.  Continue with weekly biophysical profiles. No further assessments of MCA Dopplers necessary.    ####G6 2020-21#####  02/12/2019 [redacted]w[redacted]d Single viable intrauterine pregnancy with biometry consistent with clinical dates.  Normal nuchal translucency and early anatomy evaluation.  We discussed monitoring strategy similar to her last pregnancy.  Namely, FAS at 20 weeks, then monthly growth and MCA evaluations to start.   Since there has been no meaningful change in her health status the full formal consultation was not repeated. Hildred Priest, MD, PhD     04/09/2019 [redacted]w[redacted]d normal fetal anatomy survey.  Reassessment of fetal growth in 4 weeks and begin MCA PSV screening at that time. Hildred Priest, MD, PhD     05/07/2019  [redacted]w[redacted]d Fetal growth is appropriate. Normal limited fetal anatomy. MCA PSV is 1.05 MoM with is not consistent with emerging fetal anemia.  Reassessment in 4 weeks. Hildred Priest, MD, PhD     06/06/2019 [redacted]w[redacted]d Fetal growth is appropriate. Normal limited fetal anatomy. Normal AFI. MCA PSV 1.23 MoM. No evidence of fetal anemia.Follow up MCA assessment and BPP in 2 weeks recommended. Mechele Collin, MD   07/09/2019 [redacted]w[redacted]d Fetal growth is appropriate. Normal limited fetal anatomy. Normal AFI. Reassuring  BPP. MCA PSV is normal at 1.37 MoM, but the trend is increasing.  As such, I recommend a repeat BPP with MCA Doppler each week.  Furthermore, with isoimmunization, delivery at 37 weeks is recommended.  Hildred Priest, MD, PhD          Genetic screening    Overview     First Trimester Screen:   Down Syndrome 1 in 5063  Trisomy 18/13  1 in > 10,000  Free Beta hCG 70  Papp-A 30  AFP 95  Results to [facility]  Letter to patient           Chest tightness or pressure    [redacted] weeks gestation of pregnancy    Chest pressure    1st degree AV block          Past medical history, surgical history, medication list, and allergies reviewed and updated as appropriate.    Social History  Patient denies tobacco, alcohol, THC, or illicit drug use. Lives with husband and 2 children.    Objective    Vitals  BP: 111/61 / P: 82 / T: 36.7 ?C (98.1 ?F) / RR:      Physical Examination  General: alert, no acute distress  Skin: warm and dry, no rashes  HEENT: pupils equal and equally reactive to light, mucus membranes moist  Pulm: clear to auscultation bilaterally, no wheezes or crackles  Cardiovascular: regular rate and rhythm, no murmurs  Abdomen: gravid  abdomen, tenderness to palpation of inferior rib cage on R, tender in that area with manipulation of uterus. No tenderness under L side of rib cage. Very mild tenderness in RUQ when palpating lower quadrants bilaterally. Not able to palpate liver edge.  Back: No midline tenderness of spine. When pressing on 11th rib on right, moderate to severe pain along the length of the rib. None on L. No other tender ribs identified.   Lower Extremities: trace edema bilaterally    [facility] Investigations  Bedside Ultrasound: deferred, was vertex this AM with Dr. Park Breed  Cervix: closed, thick, high  Fetal Heart Tones: baseline 135, mod variability, accelerations present, decelerations absent  Tocometer: contractions every 20 min, felt by mom    Prenatal Labs  Blood Type  O, Rh Positive  Antibody  Negative  Last Hct 07/15/2019: 30.8  Last Hgb 07/15/2019: 10.4  Plt   07/15/2019: 115  Rub   17 (Immune)  RPR   Non-Reactive  HBsAg  Nonreactive  HIV   Negative  TSH   2.25  GC   None  Chlamydia  None    Quad Screen  Normal  1 hr GTT  87 (Negative)    GBS  06/16/2015: Positive for Group B Strep; Group B Streptococci are susceptible to ampicillin, penicillin, and cefazolin, but may be erythromycin and/or clindamycin resistant. Contact laboratory if erythromycin and/or clindamycin testing is necessary.  Urine Culture  05/28/2019: Mixed Skin / Genital Flora    Last Ultrasound: 07/09/2019  Placenta pos:  Anterior   EFW:       2,303 g 59%    Labs  Recent Results (from the past 24 hour(s))   CBC with Auto Differential -Next Routine    Collection Time: 07/15/19 11:09 AM   Result Value Ref Range    WBC 10.2 4.5 - 11.0 10*3/?L    RBC 3.73 (L) 3.80 - 5.60 10*6/?L    Hemoglobin 10.4 (L) 11.7 - 16.5 g/dL    HCT 78.2 (L) 95.6 - 48.0 %    MCV 82.4 81.0 - 100.0 fL    MCH 27.8 (L) 28.3 - 33.3 pg    MCHC 33.8 32.5 - 36.0 g/dL    RDW 21.3 08.6 - 57.8 %    Platelet Count 115 (L) 150 - 405 10*3/?L    MPV 7.9 6.9 - 10.0 fL    Neutrophils % 76 (H) 50 - 74 %     Lymphocytes % 18 (L) 20 - 40 %    Monocytes % 6 2 - 12 %    Eosinophils % 0 0 - 5 %    Basophils % 0 0 - 2 %    Neutrophils, Absolute 7.7 (H) 2.0 - 7.4 10*3/?L    Lymphocytes, Absolute 1.9 1.0 - 4.0 10*3/?L    Monocytes, Absolute 0.6 0.2 - 1.0 10*3/?L    Eosinophils, Absolute 0.0 0.0 - 0.4 10*3/?L    Basophils, Absolute 0.0 0.0 - 0.1 10*3/?L   Comprehensive Metabolic Panel -Next Routine    Collection Time: 07/15/19 11:09 AM   Result Value Ref Range    Sodium 135 135 - 145 mmol/L    Potassium 3.59 3.50 - 5.10 mmol/L    Chloride 102.5 101.0 - 111.0 mmol/L  CO2 - Carbon Dioxide 22 17 - 27 mmol/L    BUN 6 (L) 8 - 20 mg/dL    Creatinine 1.61 (L) 0.60 - 1.30 mg/dL    Glucose 78 74 - 096 mg/dL    Calcium 8.1 (L) 8.6 - 10.6 mg/dL    AST - Aspartate Aminotransferase 18 5 - 32 IU/L    ALT - Alanine Aminotransferase 9 5 - 33 IU/L    Alkaline Phosphatase 71 35 - 105 IU/L    Protein Total 5.7 (L) 6.4 - 8.2 g/dL    Albumin 3.3 (L) 3.5 - 5.0 g/dL    Bilirubin Total 0.45 0.10 - 1.70 mg/dL    Anion Gap 40.9 9.0 - 18.0 mmol/L    Albumin/Globulin Ratio 1.4 1.2 - 2.2    Glomerular Filtration Rate Estimate (Female) >90 >=60 mL/min/1.64m*2    GFR Additional Info      BUN / Creatinine Ratio 15.8 12.0 - 20.0    Osmolality Calculation 266.6 (L) 275.0 - 300.0 mOsm/kg       Assessment / Plan    Lyrik Dockstader is a 34 y.o. female [phone] here at [redacted]w[redacted]d for subcostal pains. 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    Epigastric and RUQ pain  Differential includes gastritis, PUD, preeclampsia, cartilaginous inflammation of inferior ribcage, general musckuloskeletal pain in ribcage, nerve entrapment. The patient was given a heating pad and 650 mg of tylenol. Labs were drawn and showed platelets 115k, normal white count (10.2), normal electrolytes (except mildly low calcium - 8.1), Cr 0.38, normal hepatic function panel. Her cervix was checked to rule out preterm labor and was closed, thick, and high. She did not  have much pain relief with heating pad/tylenol. She was prescribed prilosec 20 mg daily and was discharged home to follow up with Dr. Laurence Compton tomorrow as previously planned for evaluation and further management.     Anti-MTa antibodies  MFM is following, recommending weekly BPP and MCA doppler and IOL at 37 weeks (set up for 07/30/19 at 10 pm for ripening) due to increasing MCA Doppler velocities. May cause fetal anemia in late term babies. She reports that her [place] this AM was normal per Dr. Park Breed.       Patient will be discharged home in stable condition with instructions to follow up with her provider.    Return precautions given for vaginal bleeding, loss of fluid, or contractions that are getting more frequent or stronger in consistency.   All maternal questions answered and mom is aware that she can call the [facility] or her provider if she has further questions.     Dr. Laurence Compton notified and agrees with the above plan.    Georges Mouse, MD  07/15/2019, 12:23 PM

## 2019-07-15 NOTE — Progress Notes (Signed)
Rachel [name] is a 34 y.o. female who presents today for read only [place] for BPP.    BP: (!) 113/59(bp machine) /   /   / Temp: 36.9 ?C (98.4 ?F)    Body mass index is 31.28 kg/m?Marland Kitchen       Ambulatory Nursing Form     Do you have a cough, fever, shortness of breath or acute illness?  no  If yes, mask was provided to patient for office/clinical exam time period? Yes, all patients are being given masks regardless of symptoms.      Have you traveled within the last 30 days outside the [place]? no  If yes, has testing been obtained? Have you had any contact with individuals confirmed to have COVID-19? no

## 2019-07-16 ENCOUNTER — Ambulatory Visit: Admit: 2019-07-16 | Discharge: 2019-07-16 | Attending: MD | Primary: MD

## 2019-07-16 ENCOUNTER — Encounter: Attending: MD | Primary: MD

## 2019-07-16 DIAGNOSIS — O361930 Maternal care for other isoimmunization, third trimester, not applicable or unspecified: Secondary | ICD-10-CM

## 2019-07-16 LAB — GROUP B STREP CULTURE: Culture Result: POSITIVE — AB

## 2019-07-16 NOTE — Progress Notes (Signed)
Rachel [name] is a 34 year old [id number] at [redacted]w[redacted]d who presents for a return OB visit.  She has good fetal movement.  She denies any abnormal vaginal discharge, bleeding, or leakage of fluid.  She has an occasional Braxton-Hicks contraction, but they are mild and intermittent.  She struggled with RUQ discomfort yesterday and was evaluated on labor and delivery.  Her labs were normal.  Her surgical history is notable for an appendectomy and cholecystectomy.  Her epigastric and RUQ discomfort improved with prilosec.    O:  Prenatal Vitals     Enc. Date GA Prenatal Vitals Urine Albumin/Glucose Edema Prenatal Presentation Dilation/Effacement/Station    02/12/19 [redacted]w[redacted]d 118/60 / 168 lb (76.2 kg)         03/04/19 [redacted]w[redacted]d 110/60 / 165 lb (74.8 kg) Negative / Negative None / None  / 148      04/02/19 [redacted]w[redacted]d 118/60 / 171 lb (77.6 kg) Negative / Negative None / None  / 145 / Present      04/09/19 [redacted]w[redacted]d 124/62 / 172 lb (78 kg)         04/23/19 [redacted]w[redacted]d 110/60 / 175 lb (79.4 kg)  None / None 22 cm / 142 / Present      04/30/19 [redacted]w[redacted]d Admission Dept: [facility]    05/07/19 [redacted]w[redacted]d 120/60 / 177 lb (80.3 kg)         05/16/19 [redacted]w[redacted]d 110/60 / 178 lb (80.7 kg)  None / None 25 cm / 140 / Present      05/28/19 [redacted]w[redacted]d 122/64 / 180 lb (81.6 kg) Negative / Negative None / None  / 150 / Present      06/06/19 [redacted]w[redacted]d 117/56 (A) / 181 lb 9.6 oz (82.4 kg)         06/18/19 [redacted]w[redacted]d 116/68 / 182 lb (82.6 kg) Negative / Negative None / None 30 cm / 148 / Present  0 / 0 / -3    07/02/19 [redacted]w[redacted]d 102/60 / 184 lb (83.5 kg)  None / None 32 cm / 138 / Present      07/09/19 [redacted]w[redacted]d 124/60 / 187 lb (84.8 kg)         07/11/19 [redacted]w[redacted]d 108/60 / 185 lb (83.9 kg)  None / None 34 cm / 140 / Present      07/15/19 [redacted]w[redacted]d 113/59 (A)* / 188 lb (85.3 kg)                      *BP: bp machine    07/15/19 [redacted]w[redacted]d Admission Dept: [facility]    07/16/19 [redacted]w[redacted]d 110/60 / 185 lb (83.9 kg)  None / None 35 cm / 138 / Present      Number of fetuses: 1 Height: 5' 5 (1.651 m)           A/P:  34 year old G6P2032 at [redacted]w[redacted]d, fetal well being  is reassuring.    1. 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  2. ?Anti-MTa antibodies  ????????????- MFM is following patient  ????????????- monthly growth and MCA evaluation for fetal anemia  ????????????- antibodies may cause fetal anemia in late term babies  ????????????- MCA doppler was normal on 06/06/19 and 07/15/19, normal growth  ????????????- Ultrasound on 07/09/19 - BPP was reassuring.  While MCA  Doppler is normal, the trend is increasing (1.37 MoM).  Dr. Park Breed                 with MFM recommended weekly BPP and MCA doppler each                 week and IOL at 37 weeks.              - IOL is set up for 07/30/19 at 10 pm for cervical ripening.

## 2019-07-16 NOTE — Progress Notes (Signed)
Patient is 34 weeks 6 days. States yesterday she was having some contractions. Denies any bleeding, spotting, leaking of fluids or abnormal discharge. Baby is moving good.

## 2019-07-17 NOTE — Telephone Encounter (Signed)
Phone call was placed to patient to let Rachel [name] know that Rachel [name] and Rachel [name] spouse Robert's FMLA paperwork is completed and has been faxed to [facility] [employer]

## 2019-07-18 ENCOUNTER — Encounter: Attending: MD | Primary: MD

## 2019-07-22 ENCOUNTER — Inpatient Hospital Stay: Admit: 2019-07-22 | Discharge: 2019-07-22 | Attending: Maternal & Fetal Medicine | Primary: MD

## 2019-07-22 ENCOUNTER — Institutional Professional Consult (permissible substitution): Admit: 2019-07-22 | Discharge: 2019-07-22 | Primary: MD

## 2019-07-22 ENCOUNTER — Ambulatory Visit: Admit: 2019-07-22 | Discharge: 2019-07-22 | Payer: PRIVATE HEALTH INSURANCE | Primary: MD

## 2019-07-22 DIAGNOSIS — O361930 Maternal care for other isoimmunization, third trimester, not applicable or unspecified: Secondary | ICD-10-CM

## 2019-07-22 DIAGNOSIS — Z369 Encounter for antenatal screening, unspecified: Secondary | ICD-10-CM

## 2019-07-22 LAB — URINALYSIS, MACROSCOPIC (DIP ONLY) (SLM POC)
Bilirubin, Urine: NEGATIVE
Blood, Urine: NEGATIVE
Glucose, Urine: NEGATIVE mg/dL
Ketones, Urine: NEGATIVE mg/dL
Leukocyte Esterase, Urine: NEGATIVE
Nitrite, Urine: NEGATIVE
Protein, Urine: NEGATIVE mg/dL
Specific Gravity, Urine: 1.02 (ref 1.005–1.030)
Urobilinogen, Urine: 0.2 mg/dL (ref 0.0–2.0)
pH, UA: 8.5 — ABNORMAL HIGH (ref 5.0–7.5)

## 2019-07-22 NOTE — Progress Notes (Signed)
Rachel [name] is a 34 y.o. female who presents today for read only [place] for BPP.    BP: 124/62 /   /   / Temp: 36.6 ?C (97.8 ?F)    Body mass index is 31.12 kg/m?Marland Kitchen       Ambulatory Nursing Form     Do you have a cough, fever, shortness of breath or acute illness?  no  If yes, mask was provided to patient for office/clinical exam time period? Yes, all patients are being given masks regardless of symptoms.      Have you traveled within the last 30 days outside the [place]? no  If yes, has testing been obtained? Have you had any contact with individuals confirmed to have COVID-19? no

## 2019-07-23 ENCOUNTER — Ambulatory Visit: Admit: 2019-07-23 | Discharge: 2019-07-23 | Attending: MD | Primary: MD

## 2019-07-23 ENCOUNTER — Encounter: Attending: MD | Primary: MD

## 2019-07-23 DIAGNOSIS — Z3A35 35 weeks gestation of pregnancy: Secondary | ICD-10-CM

## 2019-07-23 LAB — URINALYSIS, MACROSCOPIC (DIP ONLY) (SLM POC)
Bilirubin, Urine: NEGATIVE
Blood, Urine: NEGATIVE
Glucose, Urine: NEGATIVE mg/dL
Ketones, Urine: NEGATIVE mg/dL
Nitrite, Urine: NEGATIVE
Protein, Urine: NEGATIVE mg/dL
Specific Gravity, Urine: 1.015 (ref 1.005–1.030)
Urobilinogen, Urine: 0.2 mg/dL (ref 0.0–2.0)
pH, UA: 7 (ref 5.0–7.5)

## 2019-07-23 MED ORDER — betamethasone acetate-betamethasone sodium phosphate (CELESTONE) injection 12 mg
6 | Freq: Once | INTRAMUSCULAR | Status: AC
Start: 2019-07-23 — End: 2019-07-23

## 2019-07-23 MED ORDER — betamethasone acetate-betamethasone sodium phosphate (CELESTONE) 6 mg/mL injection
6 | INTRAMUSCULAR | Status: AC
Start: 2019-07-23 — End: 2019-07-23
  Administered 2019-07-23: 20:00:00 6 mg/mL via INTRAMUSCULAR

## 2019-07-23 NOTE — Progress Notes (Signed)
Rachel [name] is a 34 year old [id number] at [redacted]w[redacted]d who presents for a return OB visit.  She has a high risk pregnancy due to anti-MTa antibodies.  She has good fetal movement.  She denies any abnormal vaginal discharge, bleeding, or leakage of fluid.  She has occasional contractions, but they are mild.    O:  Prenatal Vitals     Enc. Date GA Prenatal Vitals Urine Albumin/Glucose Edema Prenatal Presentation Dilation/Effacement/Station    02/12/19 [redacted]w[redacted]d 118/60 / 168 lb (76.2 kg)         03/04/19 [redacted]w[redacted]d 110/60 / 165 lb (74.8 kg) Negative / Negative None / None  / 148      04/02/19 [redacted]w[redacted]d 118/60 / 171 lb (77.6 kg) Negative / Negative None / None  / 145 / Present      04/09/19 [redacted]w[redacted]d 124/62 / 172 lb (78 kg)         04/23/19 [redacted]w[redacted]d 110/60 / 175 lb (79.4 kg)  None / None 22 cm / 142 / Present      04/30/19 [redacted]w[redacted]d Admission Dept: [facility]    05/07/19 [redacted]w[redacted]d 120/60 / 177 lb (80.3 kg)         05/16/19 [redacted]w[redacted]d 110/60 / 178 lb (80.7 kg)  None / None 25 cm / 140 / Present      05/28/19 [redacted]w[redacted]d 122/64 / 180 lb (81.6 kg) Negative / Negative None / None  / 150 / Present      06/06/19 [redacted]w[redacted]d 117/56 / 181 lb 9.6 oz (82.4 kg)         06/18/19 [redacted]w[redacted]d 116/68 / 182 lb (82.6 kg) Negative / Negative None / None 30 cm / 148 / Present  0 / 0 / -3    07/02/19 [redacted]w[redacted]d 102/60 / 184 lb (83.5 kg)  None / None 32 cm / 138 / Present      07/09/19 [redacted]w[redacted]d 124/60 / 187 lb (84.8 kg)         07/11/19 [redacted]w[redacted]d 108/60 / 185 lb (83.9 kg)  None / None 34 cm / 140 / Present      07/15/19 [redacted]w[redacted]d 113/59* / 188 lb (85.3 kg)                      *BP: bp machine    07/15/19 [redacted]w[redacted]d Admission Dept: [facility]    07/16/19 [redacted]w[redacted]d 110/60 / 185 lb (83.9 kg)  None / None 35 cm / 138 / Present      07/22/19 [redacted]w[redacted]d 124/62 / 187 lb (84.8 kg)         07/23/19 [redacted]w[redacted]d 110/68 / 196 lb (88.9 kg) Negative / Negative None / None 36 cm / 134 / Present Vertex     07/23/19 [redacted]w[redacted]d Admission Dept: [facility]       Number of Fetuses   1    Height   5' 5 (1.651 m)              A/P:  34 year old G6P2032 at [redacted]w[redacted]d, fetal well  being is reassuring.    1. 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  2. ?Anti-MTa antibodies  ????????????- MFM is following patient  ????????????- monthly growth and MCA evaluation for fetal anemia  ????????????- antibodies may cause fetal anemia in late term babies  ????????????- MCA  doppler was normal on 06/06/19 and 07/15/19, normal growth  ????????????-?Ultrasound on 07/09/19?- BPP was reassuring. ?While MCA   ??????????????Doppler is normal, the trend is increasing (1.37 MoM). ?Dr. Park Breed   ??????????????with MFM recommended weekly BPP and MCA doppler each   ??????????????week and IOL at 37 weeks.  ????????????- IOL is set up for 07/30/19 at 10 pm for cervical ripening.    3.  GBS Positive   - for antibiotics when in labor    4.  History of postpartum depression after previous 2 deliveries   - will monitor for symptoms postpartum   - pt was offered to be started on an antidepressant after delivery,     but she declined and would like to see how she does first.

## 2019-07-23 NOTE — Progress Notes (Signed)
Patient presents for bethamethasone injection for planned induction at [redacted] weeks gestation. Denies bleeding, LOF, contractions, and +FM.

## 2019-07-23 NOTE — Progress Notes (Signed)
Patient discharged home in stable condition. Will return tomorrow after 1230 for second betamethasone injection.

## 2019-07-23 NOTE — Progress Notes (Signed)
REPORT TITLE:  Labor and delivery triage note.    DATE OF EVALUATION:  07/23/2019.    CHIEF COMPLAINT:  High-risk pregnancy, necessitating early delivery.    HISTORY OF PRESENT ILLNESS:  Rachel [name] is a 34 year old G6, P2-0-3-2 at 35   weeks and 6 days pregnant, who presents to labor and delivery to receive   her first betamethasone injection.  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  Patient has been undergoing monthly growth and MCA   evaluation for fetal anemia.  She had an ultrasound on 07/09/2019 in which  the biophysical profile was reassuring.  While the MCA Doppler was normal,  the trend was increasing.  As a result, Karma Ganja, MD with MFM   recommended delivery at 37 weeks.  As a result, the patient presents to   labor and delivery today to receive her first betamethasone injection.    She has good fetal movement.  She denies any abnormal vaginal discharge,   vaginal bleeding, or leakage of fluid.  She has an occasional contraction,  but they are mild.    PHYSICAL EXAMINATION:  GENERAL:  No acute distress.  CARDIOVASCULAR:  Regular rate and rhythm.  Soft and nontender.  Fundal   height 35 cm.  EXTREMITIES:  No calf pain or swelling.  PELVIC:  Deferred.  Fetal heart rate was 134 beats per minute.    ASSESSMENT:  A 34 year old G6, P2-0-3-2 at 35 weeks and 6 days with a   high-risk pregnancy due to the anti-MTa antibodies.    HOSPITAL COURSE:  The patient received her first dose of betamethasone.    She tolerated the injection well.  Fetal wellbeing was reassuring.  The   patient was discharged home.    Nile Dear. Laurence Compton, MD  D: 09/07/2019 10:12  T: 09/07/2019 10:26  J/R: [id number]  BARCH / Jearl Klinefelter

## 2019-07-23 NOTE — Progress Notes (Signed)
Patient is 35 weeks 6 days. GBS positive. Baby is moving good. Denies any bleeding, spotting, abnormal discharge or leaking of fluids. States no contractions.

## 2019-07-24 ENCOUNTER — Encounter: Primary: MD

## 2019-07-24 NOTE — Consults (Signed)
REQUESTING PHYSICIAN:  CONSULTING PHYSICIAN: Nile Dear. Laurence Compton, MD  DATE OF CONSULTATION: 07/24/2019  CHIEF COMPLAINT:  High risk pregnancy, necessitating delivery at 37 weeks.    HISTORY OF PRESENT ILLNESS:  Rachel [name] is a 34 year old G6, P2-0-3-2 at 36   weeks and 0 days, who presents to labor and delivery to receive her second  dose of her betamethasone.  She has a high risk pregnancy due to the   presence of anti-MTa antibodies.  Maternal fetal medicine has been   evaluating the patient on a weekly basis.  She had been receiving monthly   growth and MCA evaluation for fetal anemia.  However, on her ultrasound on  07/09/2019, her biophysical profile was reassuring.  While her MCA Doppler  was normal, the trend was increasing.  As a result, Karma Ganja, MD, with   maternal fetal medicine recommended delivery at 37 weeks.  Patient has   good fetal movement.  She denies any abnormal vaginal discharge, vaginal   bleeding, or leakage of fluid.  She has occasional contractions, but they   are mild and intermittent.    PHYSICAL EXAMINATION:  VITAL SIGNS:  Patient is afebrile with stable vital signs.  GENERAL:  No acute distress.  CARDIOVASCULAR:  Regular rate and rhythm.  LUNGS:  Clear to auscultation bilaterally.  ABDOMEN:  Soft, nontender.  Fundal height 36 cm.  EXTREMITIES:  No calf pain or swelling.  PELVIC:  Deferred.    ASSESSMENT:  A 34 year old G6, P2-0-3-2 at 36 weeks and 0 days with a high  risk pregnancy, necessitating early delivery at 37 weeks.    HOSPITAL COURSE:  Patient received her second dose of betamethasone today.   She tolerated the injection well.  She was discharged home in stable   condition.    Nile Dear. Laurence Compton, MD  D: 09/07/2019 10:17  T: 09/07/2019 10:41  J/R: [id number]  BARCH / Jearl Klinefelter

## 2019-07-25 ENCOUNTER — Encounter: Attending: MD | Primary: MD

## 2019-07-25 MED ORDER — betamethasone acetate-betamethasone sodium phosphate (CELESTONE) injection 12 mg
6 | Freq: Once | INTRAMUSCULAR | Status: AC
Start: 2019-07-25 — End: 2019-07-24
  Administered 2019-07-25: 02:00:00 6 mg via INTRAMUSCULAR

## 2019-07-29 ENCOUNTER — Encounter: Primary: MD

## 2019-07-30 ENCOUNTER — Encounter: Primary: MD

## 2019-07-30 ENCOUNTER — Encounter: Attending: MD | Primary: MD

## 2019-07-30 NOTE — H&P (Signed)
Maternal H&P    Subjective    Chief Complaint: IOL at 37 weeks for anti-MTa antibodies and uptrending MCA doppler per MFM Dr. Park Breed    HPI  Rachel [name] is a 34 y.o. [id number] at [redacted]w[redacted]d with Estimated Date of Delivery: 08/21/19 based on LMP c/w [redacted]w[redacted]d [place]. She has been receiving her prenatal care with Dr. Laurence Compton. 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     She presents for IOL. Induction with cytotec was less successful than cervidil in the past. Required pitocin and progressed quickly.    Son with history of jaundice requiring ~1 day of monitoring after birth. Daughter with history of anemia at birth.     ROS  ? Positive for fetal movement  ? Negative for contractions, vaginal bleeding, LOF, RUQ pain, vision changes, headache, vaginal discharge, hematuria or dysuria    Obstetrical History  OB History   Gravida Para Term Preterm AB Living   6 2 2   3 2    SAB TAB Ectopic Multiple Live Births   3     0 2      # Outcome Date GA Lbr Len/2nd Weight Sex Delivery Anes PTL Lv   6 Current            5 Term 07/09/15 [redacted]w[redacted]d  3.104 kg (6 lb 13.5 oz) M Vag-Spont EPI N LIV   4 Term 07/16/06 [redacted]w[redacted]d  3.572 kg (7 lb 14 oz) F Vag-Spont   LIV   3 SAB            2 SAB            1 SAB               Obstetric Comments   #1:  Pupps disease       Gynecological History  No history of abnormal pap smears, last pap 2020 per patient, per records 06/2017. No history of STIs.    Family History  No history of birth anomalies, coagulopathies, or fetal demise.    Non-Hospital Problem List as of 07/30/2019       Non-Hospital    Family history of cancer    PCOS (polycystic ovarian syndrome)    Irregular periods/menstrual cycles    LLQ pain    Multigravida    Overview     01/07/2015 [redacted]w[redacted]d  Normal NT ultrasound.  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  Prior child anemic at birth, but not transfused nor did that child have jaundice.   Paternal testing isn't available.  Titers are uncertain to be helpful (very limited cases).  Will follow monthly MCA PSV.  Hildred Priest, MD, PhD   02/02/2015 [redacted]w[redacted]d MCA PSV = 1.08 MoM.  Reassess in 4 weeks. Hildred Priest, MD, PhD   04/21/2015 [redacted]w[redacted]d Normal growth.  MCA PSV = 1.07MoM.  Reassess in 4 weeks. Hildred Priest, MD, PhD   05/19/2015 [redacted]w[redacted]d Normla growth.  MCA PSV = 0.86MoM.  Reassess MCA PSV in 2 weeks.  Fetal growth in 4 weeks. Hildred Priest, MD, PhD   06/02/2015 [redacted]w[redacted]d MCA PSV = 0.94 MoM.  Reassess MCA PSV and growth in 2 weeks. Hildred Priest, MD, PhD   06/18/2015 [redacted]w[redacted]d Normal growth and MCA.  Given GA, recommend weekly BPPs. Mechele Collin, MD  07/01/2015 [redacted]w[redacted]d BPP = 8/8. MCA PSV =0.98 MoM.  Continue with weekly biophysical profiles. No further assessments of MCA Dopplers necessary.    ####G6 2020-21#####  02/12/2019 [redacted]w[redacted]d Single viable intrauterine pregnancy with biometry consistent with clinical dates.  Normal nuchal translucency and early anatomy evaluation.  We discussed monitoring strategy similar to her last pregnancy.  Namely, FAS at 20 weeks, then monthly growth and MCA evaluations to start.  Since there has been no meaningful change in her health status the full formal consultation was not repeated. Hildred Priest, MD, PhD     04/09/2019 [redacted]w[redacted]d normal fetal anatomy survey.  Reassessment of fetal growth in 4 weeks and begin MCA PSV screening at that time. Hildred Priest, MD, PhD     05/07/2019  [redacted]w[redacted]d Fetal growth is appropriate. Normal limited fetal anatomy. MCA PSV is 1.05 MoM with is not consistent with emerging fetal anemia.  Reassessment in 4 weeks. Hildred Priest, MD, PhD     06/06/2019 [redacted]w[redacted]d Fetal growth is appropriate. Normal limited fetal anatomy. Normal AFI. MCA PSV 1.23 MoM. No evidence of fetal anemia.Follow up MCA assessment and BPP in 2 weeks recommended. Mechele Collin, MD   07/09/2019 [redacted]w[redacted]d  Fetal growth is appropriate. Normal limited fetal anatomy. Normal AFI. Reassuring  BPP. MCA PSV is normal at 1.37 MoM, but the trend is increasing.  As such, I recommend a repeat BPP with MCA Doppler each week.  Furthermore, with isoimmunization, delivery at 37 weeks is recommended.  Hildred Priest, MD, PhD   07/15/2019 [redacted]w[redacted]d BPP 8/8.  Normal AFI. Reassuring fetal status. MCA PSV  = 1.01MoM Which is improved from last week and does not suggest the emergence of fetal anemia.  Ms. Collet is complaining today of worsening abdominal pain overnight.  She is unsure if this is contractions or simply exacerbation of pregnancy discomfort.  I have advised her to be evaluated on labor and delivery to exclude labor. Hildred Priest, MD, PhD   07/22/2019 [redacted]w[redacted]d BPP 8/8.  Normal AFI. Reassuring fetal status. MCA PSV does not reflect emerging fetal anemia.  Delivery at 37 weeks. Hildred Priest, MD, PhD          Genetic screening    Overview     First Trimester Screen:   Down Syndrome 1 in 5063  Trisomy 18/13  1 in > 10,000  Free Beta hCG 70  Papp-A 30  AFP 95  Results to [facility]  Letter to patient           Chest tightness or pressure    [redacted] weeks gestation of pregnancy    Chest pressure    1st degree AV block        Past medical history, surgical history, medication list, and allergies reviewed and updated as appropriate.    Social History  Patient denies tobacco, alcohol, THC, or illicit drug use. Lives with husband, 59 year old and 52 year old.    Objective    Vitals  BP: 122/69 / P: 93 / T: 37 ?C (98.6 ?F) / RR: 18    Physical Examination  General: alert, no acute distress, resting comfortably  Skin: warm and dry, no rashes  HEENT: pupils equal and equally reactive to light, mucus membranes moist  Pulm: clear to auscultation bilaterally, no wheezes or crackles  Cardiovascular: regular rate and rhythm, no murmurs  Abdomen: nontender gravid abdomen  Lower Extremities: no edema bilaterally  [facility]  Bedside  Ultrasound: cephalic  Cervix: closed/thick/high  Fetal Heart Tones: baseline 150-160, moderate variability, accelerations present, decelerations absent  Tocometer: contractions irregular, not felt by mom    Prenatal Labs  Blood Type  O, Rh Positive  Antibody  Negative  Last Hct 07/30/2019: 33.4  Last Hgb 07/30/2019: 11.1  Plt   07/30/2019: 142  Rub   17 (Immune)  RPR   Non-Reactive  HBsAg  Nonreactive  HIV   Negative  TSH   2.25  GC   None  Chlamydia  None    Quad Screen  Not performed  1 hr GTT  87 (Negative)    GBS  07/16/2019: Positive for Group B Strep; Group B Streptococci are susceptible to ampicillin, penicillin, and cefazolin, but may be erythromycin and /or clindamycin resistant.  Contact laboratory if erythromycin and/or clindamycin testing is necessary.  Urine Culture  05/28/2019: Mixed Skin / Genital Flora    Last Ultrasound: 07/09/2019 [place] @ [redacted]w[redacted]d  Placenta pos:  Anterior (07/22/2019 [place])   EFW:       2,303 g 59%ile    Labs  Recent Results (from the past 24 hour(s))   CBC Without Differential -Next Routine    Collection Time: 07/30/19 10:23 PM   Result Value Ref Range    WBC 11.4 (H) 4.5 - 11.0 10*3/?L    RBC 4.17 3.80 - 5.60 10*6/?L    Hemoglobin 11.1 (L) 11.7 - 16.5 g/dL    HCT 29.5 (L) 28.4 - 48.0 %    MCV 80.0 (L) 81.0 - 100.0 fL    MCH 26.7 (L) 28.3 - 33.3 pg    MCHC 33.4 32.5 - 36.0 g/dL    RDW 13.2 44.0 - 10.2 %    Platelet Count 142 (L) 150 - 405 10*3/?L    MPV 7.9 6.9 - 10.0 fL       Assessment / Plan    Rachel [name] is a 34 y.o. female [phone] here at [redacted]w[redacted]d for IOL for anti-MTa antibodies. Pregnancy complicated by anti-MTa antibodies followed by MFM, history of postpartum depression, and GBS+ status.    1. Induction of labor for Anti-MTa antibodies  Patient has been induced successfully before with her prior 2 children. First baby was induced using cytotec and pitocin. Cytotec resulted in very little change but pitocin resulted in successful vaginal delivery. Second with cervidil and pitocin with  more successful cervical ripening using cervidil.   -Cervidil vaginal suppository overnight   -Plan for pitocin if ripened after cervidil   -FWB reassuring   -GBS positive: Ampicillin 2 g now + 1 g q4h throughout duration of labor   -Will monitor baby for anemia, jaundice, signs of hemolysis - consider discussion with peds or NICU in AM for specific recs on monitoring for neonate    2. History of postpartum depression  Patient has discussed history with Dr. Laurence Compton. Decided she was not interested in SSRI after delivery prophylactically.   -Monitor for symptoms postpartum.    -EPDS with appropriate intervention (social work consultation, referral as necessary) prior to discharge     Birth plan:  ? She most likely want an epidural; wants to wait  ? They plan to Breast feed; has done formula with other children but tries to breastfeed at first  ? Expecting a baby girl, to be named Maycie.  ? Dad would like to cut the cord.  ? Baby's PCP will be Cheree Ditto.    Postpartum contraception plan: pill.  Dr. Laurence Compton notified and agrees with the above plan.    Jolee Ewing  07/30/2019, 11:43 PM    Vanessa Ralphs, MD  Family Medicine, PGY-3

## 2019-07-31 ENCOUNTER — Inpatient Hospital Stay
Admit: 2019-07-31 | Discharge: 2019-08-02 | Disposition: A | Discharge status: Home / Self Care | Source: Ambulatory Visit | Attending: MD | Admitting: MD

## 2019-07-31 LAB — CBC WITHOUT DIFFERENTIAL
HCT: 33.4 % — ABNORMAL LOW (ref 35.0–48.0)
Hemoglobin: 11.1 g/dL — ABNORMAL LOW (ref 11.7–16.5)
MCH: 26.7 pg — ABNORMAL LOW (ref 28.3–33.3)
MCHC: 33.4 g/dL (ref 32.5–36.0)
MCV: 80 fL — ABNORMAL LOW (ref 81.0–100.0)
MPV: 7.9 fL (ref 6.9–10.0)
Platelet Count: 142 10*3/??L — ABNORMAL LOW (ref 150–405)
RBC: 4.17 10*6/??L (ref 3.80–5.60)
RDW: 14.5 % (ref 11.7–16.1)
WBC: 11.4 10*3/??L — ABNORMAL HIGH (ref 4.5–11.0)

## 2019-07-31 LAB — SARS-COV-2 (COVID-19), SCREENING/ASYMPTOMATIC UNEXPOSED: SARS-CoV-2 (COVID-19) by NAAT: NEGATIVE

## 2019-07-31 LAB — ABO/RH (HCLL): Rh (D): POSITIVE

## 2019-07-31 LAB — VDRL/RPR: VDRL / RPR: NONREACTIVE

## 2019-07-31 LAB — ANTIBODY SCREEN (HCLL): Antibody Screen: NEGATIVE

## 2019-07-31 MED ORDER — miSOPROStoL (CYTOTEC) tablet 25 mcg
100 | ORAL | Status: DC | PRN
Start: 2019-07-31 — End: 2019-07-30

## 2019-07-31 MED ORDER — acetaminophen (TYLENOL) tablet 650 mg
325 | ORAL | Status: DC | PRN
Start: 2019-07-31 — End: 2019-08-01
  Administered 2019-07-31: 15:00:00 325 mg via ORAL

## 2019-07-31 MED ORDER — methylergonovine (METHERGINE) injection 200 mcg
0.2 | Freq: Once | INTRAMUSCULAR | Status: DC | PRN
Start: 2019-07-31 — End: 2019-08-01

## 2019-07-31 MED ORDER — miSOPROStoL (CYTOTEC) tablet 800 mcg
200 | Freq: Once | ORAL | Status: DC | PRN
Start: 2019-07-31 — End: 2019-08-01

## 2019-07-31 MED ORDER — ampicillin (OMNIPEN) 2 g in 100 mL 0.9 % NaCl SNAP IVPB
Freq: Once | INTRAMUSCULAR | Status: AC
Start: 2019-07-31 — End: 2019-07-31
  Administered 2019-07-31 (×2): via INTRAVENOUS

## 2019-07-31 MED ORDER — Lactated Ringers infusion
INTRAVENOUS | Status: DC
Start: 2019-07-31 — End: 2019-08-01
  Administered 2019-08-01 (×2): via INTRAVENOUS

## 2019-07-31 MED ORDER — ampicillin (OMNIPEN) 1 g in 50 mL 0.9 % NaCl SNAP IVPB
INTRAMUSCULAR | Status: DC
Start: 2019-07-31 — End: 2019-07-30

## 2019-07-31 MED ORDER — oxytocin (PITOCIN) 30 unit/500 mL in 0.9 % NaCl premix infusion
30 | INTRAVENOUS | Status: AC
Start: 2019-07-31 — End: 2019-07-31

## 2019-07-31 MED ORDER — oxytocin (PITOCIN) 30 unit/500 mL in 0.9 % NaCl premix infusion
30 | INTRAVENOUS | Status: DC | PRN
Start: 2019-07-31 — End: 2019-07-31

## 2019-07-31 MED ORDER — carboprost (HEMABATE) injection 250 mcg
250 | INTRAMUSCULAR | Status: DC | PRN
Start: 2019-07-31 — End: 2019-08-01

## 2019-07-31 MED ORDER — lidocaine 10 mg/mL (1 %) solution
10 | INTRAMUSCULAR | Status: AC
Start: 2019-07-31 — End: 2019-08-01

## 2019-07-31 MED ORDER — ampicillin (OMNIPEN) 1 g in 50 mL 0.9 % NaCl SNAP IVPB
INTRAMUSCULAR | Status: DC
Start: 2019-07-31 — End: 2019-08-01
  Administered 2019-07-31 – 2019-08-01 (×10): via INTRAVENOUS

## 2019-07-31 MED ORDER — miSOPROStoL (CYTOTEC) tablet 25 mcg
100 | Freq: Four times a day (QID) | ORAL | Status: DC
Start: 2019-07-31 — End: 2019-08-01
  Administered 2019-07-31: 19:00:00 100 ug via ORAL

## 2019-07-31 MED ORDER — dinoprostone (CERVIDIL) vaginal suppository 10 mg
10 | Freq: Once | VAGINAL | Status: AC
Start: 2019-07-31 — End: 2019-07-31
  Administered 2019-07-31: 06:00:00 10 mg

## 2019-07-31 MED ORDER — methylergonovine (METHERGINE) tablet 200 mcg
0.2 | Freq: Four times a day (QID) | ORAL | Status: AC | PRN
Start: 2019-07-31 — End: 2019-07-31

## 2019-07-31 MED ORDER — ampicillin (OMNIPEN) 2 g in 100 mL 0.9 % NaCl SNAP IVPB
Freq: Once | INTRAMUSCULAR | Status: DC
Start: 2019-07-31 — End: 2019-07-30

## 2019-07-31 NOTE — Progress Notes (Signed)
Labor Progress Note    Subjective    Ellagrace rates her discomfort with contractions as mild.     Objective    Vitals  BP: 116/60 / P: 76 / T: 36.3 ?C (97.3 ?F) / RR: 16    Fetal Heart Tones: Baseline: 145 bpm, Variability: moderate, Accelerations: present and Decelerations: Absent. Category 1.    Tocometer: Contractions every 1-2 minutes while walking and 3-4 minutes with ambulation, on 60mU/min pitocin.    Cervix: 1cm / 50% / -3 at 1540. Making little progress.    Assessment/Plan    Chloee Tena is a 34 y.o. [id number] at [redacted]w[redacted]d here for early term induction for anti-Mta ab. Remains not in labor despite cervidil and cytotec. Reassess in 1 hour - if contraction pattern tolerates pitocin will start it at that time. GBS+ receiving ampicillin. Desires epidural when she is closer to active labor. Category 1 tracing indicating adequate fetal maternal oxygen exchange.     Lewis Shock, MD  07/31/2019, 3:47 PM

## 2019-07-31 NOTE — Anesthesia Procedure Notes (Addendum)
Epidural Block    Patient location during procedure: OB  End time: 08/04/2019 8:38 PM  Reason for block: procedure for pain and at surgeon's request  Staffing  Performed: anesthesiologist   Anesthesiologist: Nancy Nordmann, MD  Preanesthetic Checklist  Completed: patient identified, IV checked, site marked, risks and benefits discussed, surgical consent, monitors and equipment checked, pre-op evaluation and timeout performed  Needle  Needle type: Tuohy   Needle gauge: 18 G  Needle length: 3.5 in  Needle insertion depth: 5 cm  Catheter type: side hole  Catheter size: 20 G  Catheter at skin depth: 11 cm  Test dose: lidocaine 1.5% with epinephrine 1-to-200,000  Assessment  No complications noted.  Procedure Well Tolerated.  Patient Comfortable.  Full Eavluation Pending.    Block Success: successful block.    Additional Notes  Unremarkable epidural catheter placement.    Patient feeling analgesic effect shortly after initial bolus.

## 2019-07-31 NOTE — Plan of Care (Signed)
Patient asks appropriate questions and participates in POC. No c/o pain/discomfort. VSS. No evidence of infection.

## 2019-07-31 NOTE — Plan of Care (Signed)
Problem: Knowledge Deficit  Goal: Verbalizes understanding of labor plan  Description: Assess patient/family/caregiver's baseline knowledge level and ability to understand information.  Provide education via patient/family/caregiver's preferred learning method at appropriate level of understanding.   Outcome: Progressing   Pt asking appropriate questions and demonstrating and understanding of POC. Continue to assess knowledge and educate as necessary. Provide discharge instructions.     Problem: Labor & Delivery  Goal: Manages discomfort  Description: Assess and monitor for signs and symptoms of discomfort.  Assess patient's pain level regularly and per hospital policy.  Administer medications as ordered. Support use of nonpharmacological methods to help control pain such as distraction, imagery, relaxation, and application of heat and cold.  Collaborate with interdisciplinary team and patient to determine appropriate pain management plan.  Outcome: Progressing  Goal: Patient vital signs are stable  Outcome: Progressing   Pt VS stable, continue to assess.     Problem: Risk for Infection  Goal: Remains Infection free  Description: Assess and monitor vital signs, skin (color, moisture, integrity, turgor), respiratory status, urinary and gastrointestinal status, and labs (WBC, cultures).  Administer antibiotics and antipyretics as ordered.  Ensure aseptic care of all intravenous lines, invasive tubes/drains and wounds. Monitor for signs and symptoms of infection (redness, warmth, discharge, increased body temperature). Wash hands properly before and after each patient care activity.  Follow isolation guidelines per hospital protocol/policy.  Collaborate with interdisciplinary team and initiate plan and interventions as ordered.  Outcome: Progressing   Pt displays no s/s of infection at this time. Continue to monitor.     Problem: Risk for Injury  Goal: Remain free of injury  Outcome: Progressing   Bed in low  position, call light within reach, continue to monitor.

## 2019-07-31 NOTE — Plan of Care (Signed)
Patient starting pitocin this PM shift. Discussed POC for PM shift, pain control options (epidural), and reportable s/s. Patient and husband both verbalized understanding. VSS.

## 2019-07-31 NOTE — Progress Notes (Signed)
Labor Progress Note    Subjective    Luvena rates her discomfort with contractions as mild.     Objective    Vitals  BP: (!) 111/56 / P: 86 / T: 36.7 ?C (98.1 ?F) / RR: 16    Fetal Heart Tones: Baseline: 130s bpm, Variability: moderate, Accelerations: present and Decelerations: Absent. Category 1.    Tocometer: Contractions every 2-4 minutes, on 31mU/min pitocin.    Cervix: 1cm / 50% / -3 at 2000. Attempt at AROM unsuccessful Making little progress.    Assessment/Plan    Koren Plyler is a 34 y.o. [id number] at [redacted]w[redacted]d here for IOL 2/2 anti-Mta ab. Making no cervical change. Membranes remain intact.     1. Given little cervical change we will attempt to place [facility] Catheter after epidural placement (per patient preference)   2. GBS+ on appropriate ampicillin regimen  3. Category 1 tracing indicating adequate maternal fetal oxygen exchange.   4. Anticipate SVD.    Lewis Shock, MD  07/31/2019, 8:07 PM

## 2019-07-31 NOTE — Progress Notes (Signed)
Progress Note    Subjective    Rachel [name] rates her discomfort with contractions as mild.     Objective    Vitals  BP: 116/60 / P: 76 / T: 36.3 ?C (97.3 ?F) / RR: 16    Fetal Heart Tones: Baseline: 140s bpm, Variability: moderate, Accelerations: present and Decelerations: Absent. Category 1.    Tocometer: Contractions every irritable for the most part. Intermittently contracting every 2-3 minutes    Cervix: 1cm. Little change with cervidil    Assessment/Plan    Rachel [name] is a 34 y.o. [id number] at [redacted]w[redacted]d here for IOL for anti-Mta ab. Need little change with Cervidil so we will proceed with Cytotec and recheck 4 hours from administration. GBS positive receiving ampicillin. Tolerating uterine irritability well-unsure about epidural at this time.       Lewis Shock, MD  07/31/2019, 2:19 PM

## 2019-07-31 NOTE — Anesthesia Pre-Procedure Evaluation (Signed)
Anesthesia Evaluation     Patient summary reviewed    No history of anesthetic complications     Airway   Mallampati: I  TM distance: > 8 cm  Neck ROM: full  ULBT: I  No Facial Hair  no TMJ problem  neck not short    Dental    (+) good dentition    Pulmonary     breath sounds clear to auscultation  (+) asthma,   Cardiovascular   (+) dysrhythmias,   (-) murmur    Rhythm: regular  Rate: normal  ROS comment: History of 1* AV block, not present on most recent EKG.     Neuro/Psych    (+) headaches,     GI/Hepatic/Renal    (+) GERD,     Endo/Other - negative ROS    Musculoskeletal - negative ROS                   Anesthesia Plan    ASA 2     epidural   (Also discussed risk of infection. )    other induction         Anesthetic plan, risks and benefits discussed with patient.  Risks discussed included (but were not limited to)   Neuraxial risks dural puncture headache, bleeding and nerve damageConsenting person understands and agrees to proceed. PARQ.  Pre-Anesthesia Evaluation Completed at:  07/31/2019 8:25 PM

## 2019-07-31 NOTE — Progress Notes (Signed)
Labor Progress Note    Subjective    Mackenzi rates her discomfort with contractions as mild, s/p epidural.    Objective    Vitals  BP: 143/73 / P: 90 / T: 36.7 ?C (98.1 ?F) / RR: 16    Fetal Heart Tones: Baseline: 120s bpm, Variability: moderate, Accelerations: present and Decelerations: none. Category 1 FHT.    Tocometer: Contractions every 2-3 minutes, on 6 mU/min pitocin.    Cervix: 2 cm / 50% / -3, AROM (clear)     Assessment/Plan    Rachel [name] is a 34 y.o. [phone] at [redacted]w[redacted]d here undergoing IOL due to anti-MT antibodies and uptrending MCA dopplers per MFM.     1. FWB is reassuring s/p AROM (clear)  2. GBS positive - continue antibiotic prophylaxis  3. Anticipate SVD.    Conni Elliot, MD  07/31/2019, 9:32 PM

## 2019-08-01 ENCOUNTER — Encounter: Attending: MD | Primary: MD

## 2019-08-01 DIAGNOSIS — O99824 Streptococcus B carrier state complicating childbirth: Principal | ICD-10-CM

## 2019-08-01 LAB — PLACENTA DISCARD

## 2019-08-01 MED ORDER — ondansetron (ZOFRAN-ODT) disintegrating tablet 4 mg
4 | Freq: Four times a day (QID) | ORAL | Status: DC | PRN
Start: 2019-08-01 — End: 2019-08-02

## 2019-08-01 MED ORDER — sodium citrate-citric acid (BICITRA) oral solution 30 mL
500-334 | Freq: Once | ORAL | Status: DC | PRN
Start: 2019-08-01 — End: 2019-08-02

## 2019-08-01 MED ORDER — carboprost (HEMABATE) injection 250 mcg
250 | INTRAMUSCULAR | Status: DC | PRN
Start: 2019-08-01 — End: 2019-08-02

## 2019-08-01 MED ORDER — ePHEDrine sulfate 50 mg/mL injection
50 | INTRAVENOUS | Status: DC
Start: 2019-08-01 — End: 2019-07-31

## 2019-08-01 MED ORDER — famotidine (PF) (PEPCID) 20 mg in 0.9% NaCl IVPB Premix
20 | Freq: Once | INTRAVENOUS | Status: DC | PRN
Start: 2019-08-01 — End: 2019-08-02

## 2019-08-01 MED ORDER — docusate sodium (COLACE) capsule 100 mg
100 | Freq: Two times a day (BID) | ORAL | Status: DC | PRN
Start: 2019-08-01 — End: 2019-08-02
  Administered 2019-08-01 – 2019-08-02 (×2): 100 mg via ORAL

## 2019-08-01 MED ORDER — oxytocin (PITOCIN) injection 40 Units
10 | INTRAMUSCULAR | Status: DC | PRN
Start: 2019-08-01 — End: 2019-08-02

## 2019-08-01 MED ORDER — methylergonovine (METHERGINE) tablet 200 mcg
0.2 | Freq: Four times a day (QID) | ORAL | Status: AC | PRN
Start: 2019-08-01 — End: 2019-08-02

## 2019-08-01 MED ORDER — sodium chloride 0.9 % (NS) 500 mL bolus
INTRAVENOUS | Status: DC | PRN
Start: 2019-08-01 — End: 2019-08-01

## 2019-08-01 MED ORDER — Lactated Ringers infusion
INTRAVENOUS | Status: DC
Start: 2019-08-01 — End: 2019-08-01

## 2019-08-01 MED ORDER — carboprost (HEMABATE) injection 250 mcg
250 | Freq: Once | INTRAMUSCULAR | Status: DC | PRN
Start: 2019-08-01 — End: 2019-08-02

## 2019-08-01 MED ORDER — oxytocin (PITOCIN) 30 unit/500 mL in 0.9 % NaCl premix infusion
30 | INTRAVENOUS | Status: DC | PRN
Start: 2019-08-01 — End: 2019-08-01
  Administered 2019-08-01: 01:00:00 30 m[IU]/min via INTRAVENOUS
  Administered 2019-08-01 (×3): 30500 m[IU]/min via INTRAVENOUS
  Administered 2019-08-01 (×4): 30 m[IU]/min via INTRAVENOUS

## 2019-08-01 MED ORDER — ondansetron (ZOFRAN) injection 4 mg
4 | Freq: Four times a day (QID) | INTRAMUSCULAR | Status: DC | PRN
Start: 2019-08-01 — End: 2019-08-02

## 2019-08-01 MED ORDER — modified lanolin cream
100 | TOPICAL | Status: DC | PRN
Start: 2019-08-01 — End: 2019-08-02
  Administered 2019-08-01: 15:00:00 100 % via TOPICAL

## 2019-08-01 MED ORDER — bupivacaine (PF) (MARCAINE) 0.25 % (2.5 mg/mL) injection
0.25 | INTRAMUSCULAR | Status: DC | PRN
Start: 2019-08-01 — End: 2019-08-01
  Administered 2019-08-01: 04:00:00 0.25 % (2.5 mg/mL) via EPIDURAL

## 2019-08-01 MED ORDER — glycerin-witch hazeL (TUCKS) pad 1 pad
12.5-50 | TOPICAL | Status: DC | PRN
Start: 2019-08-01 — End: 2019-08-02
  Administered 2019-08-01: 15:00:00 via TOPICAL

## 2019-08-01 MED ORDER — lactated ringers bolus 1,000-1,500 mL
Freq: Once | INTRAVENOUS | Status: DC
Start: 2019-08-01 — End: 2019-08-01

## 2019-08-01 MED ORDER — miSOPROStoL (CYTOTEC) tablet 800 mcg
200 | Freq: Once | ORAL | Status: DC | PRN
Start: 2019-08-01 — End: 2019-08-02

## 2019-08-01 MED ORDER — fentanyl with bupivacaine (PF) 2 mcg/mL- 0.1 % epidural
2 | INTRAMUSCULAR | Status: DC
Start: 2019-08-01 — End: 2019-08-01
  Administered 2019-08-01: 09:00:00 2 mL/h via EPIDURAL
  Administered 2019-08-01: 10:00:00 2-0.1 mL/h via EPIDURAL

## 2019-08-01 MED ORDER — benzocaine-menthol-lanolin-aloe (DERMOPLAST) 20-0.5 % topical spray
20-0.5 | Freq: Four times a day (QID) | TOPICAL | Status: DC | PRN
Start: 2019-08-01 — End: 2019-08-02
  Administered 2019-08-01: 15:00:00 via TOPICAL

## 2019-08-01 MED ORDER — polyethylene glycol (MIRALAX) packet 17 g
17 | Freq: Every day | ORAL | Status: DC | PRN
Start: 2019-08-01 — End: 2019-08-02

## 2019-08-01 MED ORDER — diphenhydrAMINE (BENADRYL) injection 12.5-25 mg
50 | Freq: Four times a day (QID) | INTRAMUSCULAR | Status: DC | PRN
Start: 2019-08-01 — End: 2019-08-01

## 2019-08-01 MED ORDER — acetaminophen (TYLENOL) tablet 650 mg
325 | ORAL | Status: DC | PRN
Start: 2019-08-01 — End: 2019-08-02
  Administered 2019-08-02: 15:00:00 325 mg via ORAL

## 2019-08-01 MED ORDER — diphenoxylate-atropine (LOMOTIL) 2.5-0.025 mg per tablet 2 tablet
2.5-0.025 | Freq: Once | ORAL | Status: DC | PRN
Start: 2019-08-01 — End: 2019-08-02

## 2019-08-01 MED ORDER — methylergonovine (METHERGINE) injection 200 mcg
0.2 | Freq: Once | INTRAMUSCULAR | Status: DC | PRN
Start: 2019-08-01 — End: 2019-08-02

## 2019-08-01 MED ORDER — diphenhydrAMINE (BENADRYL) capsule 25 mg
25 | Freq: Four times a day (QID) | ORAL | Status: DC | PRN
Start: 2019-08-01 — End: 2019-08-01

## 2019-08-01 MED ORDER — calcium carbonate (TUMS) chewable tablet 500-1,000 mg
500 | Freq: Three times a day (TID) | ORAL | Status: DC | PRN
Start: 2019-08-01 — End: 2019-08-02

## 2019-08-01 MED ORDER — fentanyl with bupivacaine (PF) 2 mcg/mL- 0.1 % epidural
2 | INTRAMUSCULAR | Status: AC
Start: 2019-08-01 — End: 2019-08-01
  Administered 2019-08-01: 10:00:00 2-0.1 mcg/mL- 0.1 %
  Administered 2019-08-01: 04:00:00 2 mcg/mL- 0.1 %

## 2019-08-01 MED ORDER — miSOPROStoL (CYTOTEC) tablet 800 mcg
200 | ORAL | Status: DC | PRN
Start: 2019-08-01 — End: 2019-08-02

## 2019-08-01 MED ORDER — ePHEDrine sulfate 50 mg/mL injection 5-10 mg
50 | INTRAVENOUS | Status: DC | PRN
Start: 2019-08-01 — End: 2019-08-01

## 2019-08-01 MED ORDER — ibuprofen (ADVIL) tablet 800 mg
800 | Freq: Three times a day (TID) | ORAL | Status: DC
Start: 2019-08-01 — End: 2019-08-02
  Administered 2019-08-01 – 2019-08-02 (×5): 800 mg via ORAL

## 2019-08-01 MED ORDER — methylergonovine (METHERGINE) injection 200 mcg
0.2 | INTRAMUSCULAR | Status: DC | PRN
Start: 2019-08-01 — End: 2019-08-02

## 2019-08-01 MED FILL — FENTANYL-BUPIVACAINE-NACL (PF) 2 MCG/ML-0.1 % EPIDURAL PREMIX (CNR AVAILABLE): 2 2 mcg/mL- 0.1 % | INTRAMUSCULAR | Qty: 100

## 2019-08-01 NOTE — Lactation Note (Signed)
This note was copied from a baby's chart.  Rounded on mom and infant. Both were noted to be asleep. Will check back in later.

## 2019-08-01 NOTE — Lactation Note (Signed)
This note was copied from a baby's chart.  Inpatient Lactation Consultation    Date: 08/01/2019   Patient:  Rachel [name]  Mother:  [name], [name]    Birth Weight: 2.75 kg (6 lb 1 oz)  Today's Weight:        Goal: Mom wants to breast feed but open to bottle feeding.    Mom Breastfeeding Experience: She had trouble breast feeding her previous 2 children and bottle fed.     Breast Growth During Pregnancy: Mom reports that her breasts increased at least one cup size during her pregnancy. LC able to express small drops of colostrum.     Breastfeeding Assessment: Rounded on mom and infant. Mom was attempting to latch infant at the breast with assistance of primary RN. Infant noted to be born 36/6. Disc feeding challenges that can arise with preterm infants. Mom noted to have large breasts but with flat nipples. She brought nipple shields from home. A 20 mm nipple shield was applied to mom and disc that she may need at 24 mm nipple shield as nipples can grow with breast feeding and pumping. Mom's nipples also noted to be scabbed bilaterally. Soothie gel pads were dispensed. Infant too sleepy to obtain a latch. Attempted to spoon feed colostrum however mom noted to be too uncomfortable. She agreed to bottle feed. Infant wouldn't latch or suckle on the bottle after about 5 mins and disc syringe feeding with mom. Got mom all set up to syringe feed and disc pumping with mom. Primary RN updated and she will assist with getting mom set up to mom. Supplies and breast pump already at the bedside. LC will be back to check in with patient later this morning.     Percent Weight Loss: n/a, one void, no stools yet.    Plan: Continue to offer the breast on demand or at least Q 2-3 hours. Encouraging supplementation of formula and mom to pump.       Patient Education:    Mother was instructed in: Apply EBM and/or lanolin to sore or damaged nipples, Do not use lanolin cream with Soothie gel pads.  Recommend only using EBM with  Soothies, allowing EBM to air dry before applying gel pads, Pump use: frequency, duration, rationale, Hand expression: technique, frequency, duration, rationale, How to clean pump/SNS/nipple shield, Colostrum easily expressed bilaterally., Infant Nutrition Program information for ongoing support following discharge., Provided mom with nipple shield to improve latch., Feed every 2-3 hours for 15-20 min each side with free access to the breasts., Skin to skin., Breast size and nipple shape., Counting wet and dirty diapers. and Methods to wake a sleeping baby.    Written information given on: Lactation Consultant Business Card, Post Partum and Newborn Care Book, Breast pump cleaning kit and Formula feeding booklet.        Wilber Bihari   Infant Nutrition Program

## 2019-08-01 NOTE — Lactation Note (Signed)
This note was copied from a baby's chart.  Mom doesn't have a breast pump at home. She has hospital insurance and disc that with that kind of insurance she is allowed a certain fee so she can purchase whichever pump within that cost range. She then can e-mail a picture of her receipt and they will send a check for reimbursement. Infant also tolerated another 15 ml of formula from the bottle.

## 2019-08-01 NOTE — Plan of Care (Signed)
Problem: Knowledge Deficit  Goal: Patient/family/caregiver demonstrates understanding of disease process, treatment plan, medications, and discharge instructions  Description: Complete learning assessment and assess knowledge base.  Outcome: Progressing   Pt asking appropriate questions and demonstrating and understanding of POC. Continue to assess knowledge and educate as necessary. Provide discharge instructions.     Problem: Pain  Goal: Pain level within patient's desired comfort-function goal  Outcome: Progressing   Pt pain manageable with PO ibuprofen, continue to assess.     Problem: Vaginal Delivery - Recovery and Postpartum  Goal: Patient vitals and physical assessment findings are stable following delivery  Description: Edema will be absent or minimal  Outcome: Progressing   Pt VS stable, continue to assess.

## 2019-08-01 NOTE — Anesthesia Post-Procedure Evaluation (Signed)
Patient Name: Rachel [name]  Procedures performed: * No procedures listed *    Last Vitals:   Vitals Value Taken Time   BP 103/58 08/02/19 1510   Pulse 77 08/02/19 1510   Resp 16 08/02/19 1510   SpO2 100 % 08/02/19 0734   Temp 36.7 ?C 08/02/19 1510       Planned Anesthesia Type: epidural  Final Anesthesia Type: epidural  Patients Current Location: Labor and Delivery  Level of Consciousness: awake  Post Procedure Pain:adequate analgesia  Airway: Patent  Respiratory Status: room air  Cardio Status: hemodynamically stable  Hydration: adequately hydrated  PONV? NO  no anesthesia complication,               The patient was able to participate in the post op evaluation, and has recovered from regional anesthetic    Comments:      Bettey Mare, MD  8:14 AM

## 2019-08-01 NOTE — Lactation Note (Signed)
This note was copied from a baby's chart.  Inpatient Lactation Consultation    Date: 08/01/2019   Patient:  Rachel [name]  Mother:  [name], [name]    Birth Weight: 2.75 kg (6 lb 1 oz)  Today's Weight:      Breastfeeding Assessment: Rounded back in to check with mom and see how infant tolerated the feed. Parents report infant took 2 ml's by syringe and primary RN got infant to take 15 ml from the bottle. Encouraged parents that was a great feed. 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 But, as previously mentioned she bottle fed her other 2 previous children. She did also pump and reported she didn't obtain anything. Encouraged mom the pumping is for stimulating her breasts to bring in a robust milk supply. Any amount collected will be saved for her infant but it is important to pump for stimulation. Mom agreeable and denies any questions or concerns at this time. Education also provided at this time.     Percent Weight Loss: n/a infant has now voided x2 and stooled x1    Plan: Continue to offer the breast Q 2-3 hours following by mom pumping for 15 mins. Offer formula supplementation 15-30 ml at a feed. Call lactation PRN.       Patient Education:    Mother was instructed in: Apply EBM and/or lanolin to sore or damaged nipples, Do not use lanolin cream with Soothie gel pads.  Recommend only using EBM with Soothies, allowing EBM to air dry before applying gel pads, Pump use: frequency, duration, rationale, How to clean pump/SNS/nipple shield, Safe storage and handling of EBM, Hunger cues, Cluster feeding, Tracking I/O on sheet, Infant Nutrition Program information for ongoing support following discharge., Infant feeding positions., Feed every 2-3 hours for 15-20 min each side with free access to the breasts., Skin to skin., Breast size and nipple shape., Counting wet and dirty diapers., Methods to wake a sleeping baby. and Signs of an effective latch      Jye Fariss   Infant Nutrition Program

## 2019-08-01 NOTE — Progress Notes (Signed)
Labor Progress Note    Subjective    Rachel [name] rates her discomfort with contractions as none.     Objective    Vitals  BP: (!) 107/54 / P: 74 / T: 36.3 ?C (97.3 ?F) / RR: 16    Fetal Heart Tones: Baseline: 140s bpm, Variability: moderate, Accelerations: present and Decelerations: Late. Category 2.    Tocometer: Contractions every 2-3 minutes, on 61mU/min pitocin.    Cervix: 10cm / 100% / +2 at 0215.     Assessment/Plan    Rachel [name] is a 34 y.o. [id number] at [redacted]w[redacted]d here in labor. Membranes ruptured at 2100.     1. Complete and ready to push  2. GBS+ with antibiotics on board  3. Epidural with adequate analgesia   4. Category II tracing for intermittent lates that respond to repositioning.   5. Anticipate SVD.    Lewis Shock, MD  08/01/2019, 2:49 AM

## 2019-08-02 MED ORDER — omeprazole DR (PRILOSEC) 20 MG capsule
20 | ORAL_CAPSULE | Freq: Every day | ORAL | 1 refills | 90.00000 days | Status: DC
Start: 2019-08-02 — End: 2019-10-15

## 2019-08-02 MED ORDER — docusate sodium (COLACE) 100 MG capsule
100 | ORAL_CAPSULE | Freq: Two times a day (BID) | ORAL | 1 refills | 30.00000 days | Status: DC | PRN
Start: 2019-08-02 — End: 2019-10-15

## 2019-08-02 MED ORDER — ibuprofen (ADVIL) 800 MG tablet
800 | ORAL_TABLET | Freq: Three times a day (TID) | ORAL | 1 refills | 10.00000 days | Status: DC
Start: 2019-08-02 — End: 2019-10-15

## 2019-08-02 NOTE — Progress Notes (Signed)
Discussed discharge instructions with patient including s/s of infection and fever, lochia changes, activity, prescribed medications, pain management, follow up appointments, when to call the Doctor and when to seek immediate medical attention. Patient verbalized understanding.   Patient given prescribed Rx's and medication instructions. AVS given to and reviewed with patient. No further questions at this time.

## 2019-08-02 NOTE — Progress Notes (Signed)
Vaginal Delivery Postpartum Progress Note    Subjective    Rachel [name] is a 34 y.o., now [id number] who is 1 day  postpartum from spontaneous vaginal delivery at [redacted]w[redacted]d.     Overnight: NAOEs.     This AM: Doing well. Breast feeding well. She has no concerns at this time.    Post-partum Checklist  . Pain: well controlled with ibuprofen and tylenol.  Elpidio Galea: about the same to less than a normal period for her; no clots larger than a quarter.  Marland Kitchen Appetite: +  . Ambulation: +  . Flatus: +; Bowel movement: not yet    Objective    Vitals  BP: (!) 118/57 / P: 61 / T: 36.6 ?C (97.9 ?F) / RR: 12  I/O last 3 completed shifts:  In: -   Out: 1810 [Urine:1510; Blood:300]    Physical Exam  General: alert, no acute distress  Skin: warm and dry, no rashes appreciated  HEENT: mucus membranes moist  Pulm: clear to auscultation bilaterally, no wheezes or crackles  Cardiovascular: regular rate and rhythm, no murmurs  Abdomen: bowel sounds normal, appropriately tender to palpation over uterus, fundus at U-2  Lower Extremities: no edema bilaterally    Labs  Results from last 7 days   Lab Units 07/30/19  2223   WBC 10*3/?L 11.4*   HGB g/dL 75.6*   MCV fL 43.3*   PLATELETS 10*3/?L 142*         Assessment/Plan    Rachel [name] is a 34 y.o. now [id number] who is 1 day  postpartum from spontaneous vaginal delivery, doing well.     1. Routine post-partum care. Continue lactation support, perineal and nipple care, current pain control. Interested in minipill for contraception.  2. Hx of PPD - doing alright at this time, will get EPDS prior to discharge    Anticipate discharge 5/22.    Amelia Jo, MD  08/02/2019, 5:07 AM

## 2019-08-02 NOTE — Discharge Summary (Signed)
Discharge Summary - Vaginal Delivery Postpartum    Admit date: 07/30/2019  9:42 PM  Discharge date: 08/02/2019, 1 day  s/p NSVD  Discharge attending: Conni Elliot, MD    Subjective    Labor course notable for IOL for anti-MTa antibodies with increasing MCA pressures. Postpartum course was unremarkable. Her vital signs have been stable and afebrile. Patient is tolerating a regular diet and was ambulating and voiding well. She has been passing flatus. She has not had a BM. Breast feeding well.     Maternal  Name: Rachel [name]  Age: 34 y.o.  G&P s/p delivery: [phone]  GBS Status: Positive for Group B Strep; Group B Streptococci are susceptible to ampicillin, penicillin, and cefazolin, but may be erythromycin and /or clindamycin resistant.  Contact laboratory if erythromycin and/or clindamycin testing is necessary.  Blood Type: O Positive  Rubella 17 (Immune)    Pregnancy Details  Complications: anti-MTa antibodies, GBS +, history of postpartum depression, BV in January 2021 s/p flagyl.   Social concerns: none    Providers  Delivering Physician: Rachel [name]   Maternal PCP: Rachel Miners, MD  Newborn PCP: Dr. Cheree [name] Newborn  Name: Rachel [name]  Gender: female   DOB: 08/01/2017  at 3:07 AM   Gestational Age: [redacted]w[redacted]d  Birth Weight: 2.75 kg (6 lb 1 oz)   Birth Length: 50.8 cm (1' 8) (Filed from Delivery Summary)   Apgars (1 and 5 minutes): 9 , 9   Blood Type: A Positive   Direct Coombs: Positive     Delivery Details  Delivery Indication: IOL for anti-Mta ab   Delivery Method: Vaginal, Spontaneous   Delivery Position:   LOA  Anesthesia:  epidural  Complications: None   Resuscitation: Dry/Stim  Maternal Antibiotics: Yes , s/p 7 doses ampicillin     Review of Systems  ? Positive for minimal and improving lochia  ? Negative for headache, vision changes, chest pain, shortness of breath, RUQ pain    Objective    Vitals  BP: (!) 103/58 / P: 77 / T: 36.7 ?C (98.1 ?F) / RR: 16    Physical ExamPer Dr. Eloise [name] physical  exam:  General: alert, no acute distress  Skin: warm and dry, no rashes appreciated  HEENT: mucus membranes moist  Pulm: clear to auscultation bilaterally, no wheezes or crackles  Cardiovascular: regular rate and rhythm, no murmurs  Abdomen: bowel sounds normal, appropriately tender to palpation over uterus, fundus at U-2  Lower Extremities: no edema bilaterally    Labs  Results from last 7 days   Lab Units 07/30/19  2223   WBC 10*3/?L 11.4*   HGB g/dL 45.4*   MCV fL 09.8*   PLATELETS 10*3/?L 142*         Assessment/Plan    Diagnoses  ? Term pregnancy at [redacted]w[redacted]d s/p NSVD  ? Lacerations: none  ? History of postpartum depression: doing well at this time, follow up at postpartum visit    Discharge Disposition: Home with infant on Hospital Day: 4 at 1 day  s/p vaginal delivery.    Discharge Status: Well, stable    Discharge Meds     Patient's Discharge Medication List      TAKE these medications      Indications of Use   docusate sodium 100 MG capsule  Quantity: 30 capsule  Commonly known as: COLACE  Take 1 capsule by mouth 2 times daily as needed for Constipation.      herbal drugs Cap  Take 1 capsule  by mouth as needed. Colon cleanse      ibuprofen 800 MG tablet  Quantity: 30 tablet  Commonly known as: ADVIL  Take 1 tablet by mouth every 8 hours.      omeprazole DR 20 MG capsule  Quantity: 30 capsule  Commonly known as: PriLOSEC  Take 1 capsule by mouth daily.      PRENATAL VITAMIN ORAL  Take by mouth daily.         STOP taking these medications    omeprazole magnesium 20 MG tablet  Commonly known as: PriLOSEC OTC            Follow-up  ? Follow up with Dr. Laurence [name] in 6 weeks for postpartum visit.  ? Contraception plans: minipill  ? Postpartum depression    Patient was given written postpartum instructions in regards to her activity level and limitations.    Rachel Ralphs, MD  08/02/2019, 3:52 PM

## 2019-08-02 NOTE — Discharge Instructions (Signed)
1. Notify provider for temperature greater than 100.4 twice or more in 6 hours, persistent nausea and vomiting, severe uncontrolled pain, redness, tenderness, or signs of infection (pain, swelling, redness, odor or green/yellow discharge around incision site or breasts, difficulty breathing, headache or visual disturbances, hives, persistent dizziness or light-headedness, extreme fatigue, heavy vaginal bleeding saturating one or more pads per hour.  2. Follow-up with PCP in 6 weeks  3. Pelvic Rest for 6 weeks  4. No driving for 2 weeks if on narcotics

## 2019-08-02 NOTE — Plan of Care (Signed)
Pt has stable vital signs, fundus is firm and bleeding is scant. Pt reports that she has minimal pain and is controlled with ibuprofen. Bonding well with newborn. Will continue to monitor and assess.

## 2019-08-02 NOTE — Plan of Care (Signed)
Discussed today's plan of care with patient including vital sign routine, fundal assessments, prescribed medications and pain management. Patient reports feeling crampy this morning but that ibuprofen was helping with pain overall. Tylenol given this morning until ibuprofen can be given again.   Patient is up ad lib and reports feeling steady on her feet. Vital signs are stable.   Discharge Medications picked up from [facility] and given to patient to take home when she discharges.

## 2019-08-04 NOTE — Addendum Note (Signed)
Addendum  created 08/04/19 1845 by Ross Marcus, MD    Clinical Note Signed, Intraprocedure Blocks edited

## 2019-08-04 NOTE — Addendum Note (Signed)
Addendum  created 08/04/19 0815 by Nancy Nordmann, MD    Clinical Note Signed

## 2019-08-05 MED ORDER — bupivacaine (PF) (MARCAINE) 0.25 % (2.5 mg/mL) injection
0.25 | INTRAMUSCULAR | Status: DC | PRN
Start: 2019-08-05 — End: 2019-08-04
  Administered 2019-08-01: 04:00:00 0.25 % (2.5 mg/mL) via EPIDURAL

## 2019-08-12 NOTE — Telephone Encounter (Signed)
Lactation Service Telephone Consult    08/12/2019     Date of Birth: [birth year]    Feeding method (breast/bottle): bottle feeding formula    Feeding amount/frequency: Q3 hours during the day, sleeps from 10 pm to 4 am nightly, anywhere from 45-90 ml per feed.      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  Mom denies any engorgement issues.  Baby is doing well, mom will communicate any concerns from peds at 2 week follow up visit.      F  Rachel [name]

## 2019-08-14 NOTE — Telephone Encounter (Signed)
Patient would like to go back to work at 4 weeks instead of 6 weeks.  She delivered on 08-01-19. She is not getting paid and would like to go back.  She is feeling fine and feels she can do it.  Please call and let her know. jh

## 2019-08-14 NOTE — Telephone Encounter (Signed)
Dr. Laurence Compton talked to patient and ok to fill out return to work form to return to work 1 month after delivery.  Return to work form ready for pick up.  Patient will pick up tomorrow.

## 2019-08-15 ENCOUNTER — Encounter: Attending: MD | Primary: MD

## 2019-08-22 ENCOUNTER — Encounter: Attending: MD | Primary: MD

## 2019-09-08 ENCOUNTER — Ambulatory Visit: Admit: 2019-09-08 | Discharge: 2019-09-08 | Attending: MD | Primary: MD

## 2019-09-08 MED ORDER — norethindrone-ethinyl estradiol (JUNEL FE 1/20) 1 mg-20 mcg (21)/75 mg (7) per tablet
1 | ORAL_TABLET | Freq: Every day | ORAL | 3 refills | Status: DC
Start: 2019-09-08 — End: 2020-12-15

## 2019-09-08 NOTE — Progress Notes (Signed)
Patient delivered 08/01/19. Last had a pap september 2020 and no history of abnormal pap. Wants to be put back on the birth control. Denies any vaginal bleeding, abnormal discharge or issues urinating.

## 2019-09-08 NOTE — Progress Notes (Signed)
Rachel [name] is a 34 y.o. female, DOB [birth year], who presents today for her 6 week postpartum exam.      History of Present Illness  HPI   Rachel [name] presents for her 6 week postpartum exam.  On 08/01/19, she had a vaginal delivery at 37 weeks 1 day.  She did not need stitches after the delivery.  She denies any abnormal vaginal discharge, bleeding, or pelvic pain.  Her baby is healthy.  She is bottle feeding.  She denies any symptoms of postpartum depression.  Her last pap smear was one year ago, and was normal.  She does her own breast exams occasionally, but has not noticed any abnormalities.  She would like to get started on birth control pills today.  She prefers not to have an exam today because she has her kids with her.        Review of Systems     Constitutional: ?Denies fevers or chills  Neurologic: ?Denies headaches or neuropathy  CV: ?Denies chest pain or palpitations  PULM: ?Denies shortness of breath or cough  GI: ?Denies diarrhea or constipation  GU: ?Denies dysuria or hematuria  Ext: ?Denies any musculoskeletal issues     Vitals:    09/08/19 1457   BP: 109/62   Pulse: 77   Temp: 36.8 ?C (98.3 ?F)   SpO2: 96%    Body mass index is 29.29 kg/m?Marland Kitchen  Physical Exam    General:  NAD  Breast:  Deferred  CV:  RRR  Abdomen:  Soft, nontender, nondistended  Ext:  No calf pain or swelling  Pelvic:  Deferred    ASSESSMENT & PLAN:       ICD-10-CM    1. Postpartum exam  Z39.2    2. Encounter for initial prescription of contraceptive pills  Z30.011 norethindrone-ethinyl estradiol (JUNEL FE 1/20) 1 mg-20 mcg (21)/75 mg (7) per tablet     1.  Postpartum exam - Rachel [name]'s evaluation today was benign.  I sent in a prescription for Junel birth control pills.  She does not have a history of migraines with an aura or blood clots, and she does not smoke.  She denies any symptoms of postpartum depression.  She will RTC for her annual exam.

## 2019-09-09 NOTE — Progress Notes (Signed)
PHQ9 Score: 0.

## 2019-09-10 ENCOUNTER — Encounter: Attending: MD | Primary: MD

## 2019-10-03 ENCOUNTER — Encounter: Attending: MD | Primary: MD

## 2019-10-09 LAB — QUANTIFERON - TB GOLD PLUS
Mitgoen minus Nil Result: >10 IU/mL
Nil Result: 0.04 IU/mL
QuantiFERON-Tb Gold Plus Result: NEGATIVE
TB1 Ag minus Nil Result: 0 IU/mL
TB2 Ag minus Nil Result: 0.01 IU/mL

## 2019-10-15 ENCOUNTER — Ambulatory Visit: Admit: 2019-10-15 | Discharge: 2019-10-15 | Attending: FNP | Primary: MD

## 2019-10-15 ENCOUNTER — Other Ambulatory Visit: Admit: 2019-10-15 | Primary: MD

## 2019-10-15 DIAGNOSIS — N761 Subacute and chronic vaginitis: Secondary | ICD-10-CM

## 2019-10-15 DIAGNOSIS — N898 Other specified noninflammatory disorders of vagina: Secondary | ICD-10-CM

## 2019-10-15 LAB — WET PREP, GENITAL
Clue Cells: NONE SEEN
Trichomonas: NONE SEEN
Yeast: NONE SEEN
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
## 2019-10-15 NOTE — Progress Notes (Signed)
Having extra discharge than normal, spotting.  Having nausea and some back discomfort.

## 2019-10-31 ENCOUNTER — Inpatient Hospital Stay: Admit: 2019-10-31 | Discharge: 2019-10-31 | Disposition: A | Discharge status: Home / Self Care | Attending: MD

## 2019-10-31 DIAGNOSIS — M25511 Pain in right shoulder: Secondary | ICD-10-CM

## 2019-10-31 MED ORDER — naproxen (NAPROSYN) 500 MG tablet
500 | ORAL_TABLET | Freq: Two times a day (BID) | ORAL | 0 refills | Status: AC | PRN
Start: 2019-10-31 — End: 2019-11-07

## 2019-10-31 NOTE — ED Notes (Signed)
Pt discharged with written and verbal instructions regarding follow up care and prescription. Pt denies any further questions at this time.

## 2019-10-31 NOTE — Discharge Instructions (Signed)
If pain persist in her shoulder for more than 4 to 5 days, especially if the pain worsens to the point where you cannot move your shoulder, you will need follow-up for steroid injection.    If you have worsening of current symptoms, return of symptoms, or you develop concerning new symptoms, call your doctor or go to an Emergency Department.
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
## 2019-10-31 NOTE — ED Provider Notes (Signed)
Note created in error.     Festus Holts, MD  10/31/19 [phone]

## 2019-10-31 NOTE — ED Triage Notes (Signed)
Pt right arm pain following injection of covid vaccine 2 days ago.     Bruising noted to RUE. Pt reports normal ROM

## 2019-11-03 ENCOUNTER — Other Ambulatory Visit: Payer: Self-pay

## 2019-11-03 ENCOUNTER — Ambulatory Visit
Admission: EM | Admit: 2019-11-03 | Discharge: 2019-11-03 | Disposition: A | Discharge status: Home / Self Care | Payer: Medicaid Other | Attending: Emergency Medicine | Admitting: Emergency Medicine

## 2019-11-03 ENCOUNTER — Encounter: Admit: 2019-11-03 | Discharge: 2019-11-03 | Attending: PA | Primary: MD

## 2019-11-03 ENCOUNTER — Inpatient Hospital Stay: Admit: 2019-11-03 | Discharge: 2019-11-26 | Attending: PA | Primary: MD

## 2019-11-03 DIAGNOSIS — N390 Urinary tract infection, site not specified: Secondary | ICD-10-CM | POA: Diagnosis present

## 2019-11-03 DIAGNOSIS — R300 Dysuria: Secondary | ICD-10-CM | POA: Diagnosis present

## 2019-11-03 DIAGNOSIS — M25511 Pain in right shoulder: Secondary | ICD-10-CM

## 2019-11-03 HISTORY — DX: Presence of cerebrospinal fluid drainage device: Z98.2

## 2019-11-03 LAB — POCT URINALYSIS DIP (MANUAL ENTRY)
Bilirubin, UA: NEGATIVE
Glucose, UA: NEGATIVE mg/dL
Ketones, POC UA: NEGATIVE mg/dL
Nitrite, UA: NEGATIVE
Protein Ur, POC: 100 mg/dL — AB
Spec Grav, UA: 1.01 (ref 1.010–1.025)
Urobilinogen, UA: 0.2 E.U./dL
pH, UA: 6.5 (ref 5.0–8.0)

## 2019-11-03 MED ORDER — NITROFURANTOIN MONOHYD MACRO 100 MG PO CAPS: 100.0000 mg | ORAL_CAPSULE | Freq: Two times a day (BID) | ORAL | 0 refills | Status: DC

## 2019-11-03 MED ORDER — PHENAZOPYRIDINE HCL 100 MG PO TABS: 100.0000 mg | ORAL_TABLET | Freq: Three times a day (TID) | ORAL | 0 refills | Status: DC | PRN

## 2019-11-03 NOTE — ED Triage Notes (Signed)
Pt presents with c/o dysurai and lower back pain since Friday

## 2019-11-03 NOTE — ED Provider Notes (Signed)
[facility]   Chief Complaint  Patient presents with  . Dysuria     SUBJECTIVE:  Tracey Young is a 34 y.o. female who presented to the urgent care with a complaint of dysuria and lower back pain that started since Friday.  Patient denies a precipitating event, recent sexual encounter, excessive caffeine intake.  Localizes the pain to the lower right flank.  Pain is intermittent and achy in character.  Has tried OTC medications without relief.  Symptoms are made worse with urination.  Admits to similar symptoms in the past.  Denies fever, chills, nausea, vomiting, abdominal pain, flank pain, abnormal vaginal discharge or bleeding, hematuria.    LMP: Patient's last menstrual period was 10/27/2019 (approximate).  ROS: As in HPI.  All other pertinent ROS negative.     Past Medical History:  Diagnosis Date  . S/P VP shunt    History reviewed. No pertinent surgical history. No Known Allergies No current facility-administered medications on file prior to encounter.   No current outpatient medications on file prior to encounter.   Social History   Socioeconomic History  . Marital status: Legally Separated    Spouse name: Not on file  . Number of children: Not on file  . Years of education: Not on file  . Highest education level: Not on file  Occupational History  . Not on file  Tobacco Use  . Smoking status: Never Smoker  . Smokeless tobacco: Never Used  Substance and Sexual Activity  . Alcohol use: Not Currently  . Drug use: Not Currently  . Sexual activity: Not on file  Other Topics Concern  . Not on file  Social History Narrative  . Not on file   Social Determinants of Health   Financial Resource Strain:   . Difficulty of Paying Living Expenses: Not on file  Food Insecurity:   . Worried About Programme researcher, broadcasting/film/video in the Last Year: Not on file  . Ran Out of Food in the Last Year: Not on file  Transportation Needs:   . Lack of Transportation (Medical):  Not on file  . Lack of Transportation (Non-Medical): Not on file  Physical Activity:   . Days of Exercise per Week: Not on file  . Minutes of Exercise per Session: Not on file  Stress:   . Feeling of Stress : Not on file  Social Connections:   . Frequency of Communication with Friends and Family: Not on file  . Frequency of Social Gatherings with Friends and Family: Not on file  . Attends Religious Services: Not on file  . Active Member of Clubs or Organizations: Not on file  . Attends Banker Meetings: Not on file  . Marital Status: Not on file  Intimate Partner Violence:   . Fear of Current or Ex-Partner: Not on file  . Emotionally Abused: Not on file  . Physically Abused: Not on file  . Sexually Abused: Not on file   Family History  Problem Relation Age of Onset  . Hypertension Mother   . Cancer Mother   . Hypertension Father     OBJECTIVE:  Vitals:   11/03/19 1348  BP: 109/79  Pulse: 83  Resp: 18  Temp: 98.4 F (36.9 C)  SpO2: 98%   General appearance: AOx3 in no acute distress HEENT: NCAT.  Oropharynx clear.  Lungs: clear to auscultation bilaterally without adventitious breath sounds Heart: regular rate and rhythm.  Radial pulses 2+ symmetrical bilaterally Abdomen: soft; non-distended; no tenderness;  bowel sounds present; no guarding or rebound tenderness Back: no CVA tenderness Extremities: no edema; symmetrical with no gross deformities Skin: warm and dry Neurologic: Ambulates from chair to exam table without difficulty Psychological: alert and cooperative; normal mood and affect  Labs Reviewed  POCT URINALYSIS DIP (MANUAL ENTRY) - Abnormal; Notable for the following components:      Result Value   Blood, UA large (*)    Protein Ur, POC =100 (*)    Leukocytes, UA Small (1+) (*)    All other components within normal limits  URINE CULTURE    ASSESSMENT & PLAN:  1. Dysuria     Meds ordered this encounter  Medications  .  nitrofurantoin, macrocrystal-monohydrate, (MACROBID) 100 MG capsule    Sig: Take 1 capsule (100 mg total) by mouth 2 (two) times daily.    Dispense:  10 capsule    Refill:  0  . phenazopyridine (PYRIDIUM) 100 MG tablet    Sig: Take 1 tablet (100 mg total) by mouth 3 (three) times daily as needed for pain.    Dispense:  10 tablet    Refill:  0   Patient stable at discharge.  POC urinalysis showed large amount of blood with small leukocyte.  Positive for possible UTI.  Discharge instructions Urine culture sent.  We will call you with the results.   Push fluids and get plenty of rest.   Take antibiotic as directed and to completion Take pyridium as prescribed and as needed for symptomatic relief Follow up with PCP if symptoms persists Return here or go to ER if you have any new or worsening symptoms such as fever, worsening abdominal pain, nausea/vomiting, flank pain, etc...  Outlined signs and symptoms indicating need for more acute intervention. Patient verbalized understanding. After Visit Summary given.    Note: This document was prepared using Dragon voice recognition software and may include unintentional dictation errors.    Durward Parcel, FNP 11/03/19 1411

## 2019-11-03 NOTE — Discharge Instructions (Addendum)
Urine culture sent.  We will call you with the results.   Push fluids and get plenty of rest.   Take antibiotic as directed and to completion Take pyridium as prescribed and as needed for symptomatic relief Follow up with PCP if symptoms persists Return here or go to ER if you have any new or worsening symptoms such as fever, worsening abdominal pain, nausea/vomiting, flank pain, etc... 

## 2019-11-03 NOTE — Telephone Encounter (Signed)
This encounter was created in error - please disregard.

## 2019-11-03 NOTE — Addendum Note (Signed)
Addended by: Clement Husbands on: 11/26/2019 10:33 AM     Modules accepted: Orders

## 2019-11-03 NOTE — Progress Notes (Addendum)
Rachel [name] is a 34 y.o. female who presents today for right arm pain and limited range of motion, and numbness and tingling after covid vaccine x5 days.    BP: 130/85 / Pulse: 59 / SpO2: 98 % (RA) / Temp: 36.6 ?C (97.9 ?F)    Screening:   Depression:   Substance Use:   Fall Screen: N/A  Cognitive Assessment: N/A        No exam data present      Health Maintenance:  Health Maintenance Due   Topic Date Due   . PAP SMEAR W/HPV CO-TESTING  Never done           Ambulatory Nursing Form
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
## 2019-11-05 LAB — URINE CULTURE: Culture: >=100000 — AB

## 2019-11-07 NOTE — Telephone Encounter (Signed)
Patient would like Dr. Laurence Compton to prescribe the 1500 mg Metformin she was on before she had her baby.  Her pharmacy is [facility]. jh

## 2019-11-11 MED ORDER — metFORMIN (GLUCOPHAGE) 500 MG tablet
500 | ORAL_TABLET | Freq: Three times a day (TID) | ORAL | 12 refills | 30.00000 days | Status: DC
Start: 2019-11-11 — End: 2020-12-15

## 2019-11-11 NOTE — Telephone Encounter (Signed)
Rachel [name] was placed on 1500 mg of Metformin after the birth of her second child for PCOS weight gain.  She has been exercising daily and is still gaining weight after the delivery of her third child and she would like to be placed back on the 1500 mg of Metformin.  I discussed possible GI upset with starting with a high dose and recommended she start with 500 mg once daily for at least two weeks, she has constipation as a normal and had no issues with starting at the 1500 mg in the past.  We discussed if she does develop any GI upset to cut the dose to 500 mg daily till she is tolerating it and then ramp up the dose slowly over time.  She states understanding and agreement.  I sent in the Rx.

## 2019-11-24 ENCOUNTER — Inpatient Hospital Stay: Admit: 2019-11-24 | Discharge: 2019-11-24 | Attending: PA | Primary: MD

## 2019-11-24 DIAGNOSIS — M19011 Primary osteoarthritis, right shoulder: Secondary | ICD-10-CM

## 2019-11-25 NOTE — Telephone Encounter (Signed)
Ahjainene would like to discuss results with patient herself. She is aware patient tried calling back and will attempt to reach her again.    mn

## 2019-11-25 NOTE — Telephone Encounter (Signed)
Patient returns missed call from [place]. Support staff unavailable at this time. Please return call when able. Thank you!    AB

## 2019-11-25 NOTE — Telephone Encounter (Signed)
Patient calls in returning message in hopes you will try them again.Support staff unavailable at time of call.Please calls patient thank you.

## 2019-11-25 NOTE — Telephone Encounter (Signed)
I attempted to contact the patient with her MRI results.  Overall findings were normal with some mild abnormalities that I like to discuss with her.    Clement Husbands, PA

## 2020-02-24 NOTE — Telephone Encounter (Signed)
Pt called and requested to be put back on the Wellbutrin since she's post partum and feeling like she really needs to go back on it.  She requested the 75mg  BID be sent to [facility].    Requested Prescriptions     Pending Prescriptions Disp Refills   . buPROPion (WELLBUTRIN) 75 MG tablet 180 tablet 3     Sig: Take 1 tablet by mouth 2 times daily.     Last OV w/PCP: 12/20/2017 Veleta Miners, MD    Last OV w/[facility]: 11/03/2019 Clement Husbands, PA    No future appointments.     Medication is active on list and no changes have been made.    Medication Pended to [facility] ([facility]    BP Readings from Last 3 Encounters:   11/03/19 130/85   10/31/19 128/82   10/15/19 104/74     Pulse Readings from Last 3 Encounters:   11/03/19 59   10/31/19 63   10/15/19 71

## 2020-02-24 NOTE — Addendum Note (Signed)
Addended by: Rolene Course on: 02/24/2020 09:59 AM     Modules accepted: Orders

## 2020-02-25 MED ORDER — buPROPion (WELLBUTRIN) 75 MG tablet
75 | ORAL_TABLET | Freq: Two times a day (BID) | ORAL | 3 refills | 90.00000 days | Status: DC
Start: 2020-02-25 — End: 2020-03-17

## 2020-03-14 ENCOUNTER — Ambulatory Visit
Admission: EM | Admit: 2020-03-14 | Discharge: 2020-03-14 | Disposition: A | Discharge status: Home / Self Care | Payer: Medicaid - Out of State | Attending: Family Medicine | Admitting: Family Medicine

## 2020-03-14 ENCOUNTER — Encounter: Payer: Self-pay | Admitting: Emergency Medicine

## 2020-03-14 ENCOUNTER — Other Ambulatory Visit: Payer: Self-pay

## 2020-03-14 DIAGNOSIS — J069 Acute upper respiratory infection, unspecified: Secondary | ICD-10-CM

## 2020-03-14 DIAGNOSIS — R059 Cough, unspecified: Secondary | ICD-10-CM

## 2020-03-14 DIAGNOSIS — Z20822 Contact with and (suspected) exposure to covid-19: Secondary | ICD-10-CM

## 2020-03-14 NOTE — ED Provider Notes (Signed)
[facility]    CSN: [id number] Arrival date & time: 03/14/20  1255      History   Chief Complaint Chief Complaint  Patient presents with  . Generalized Body Aches    HPI Tracey Young is a 35 y.o. female.   HPI  Patient has had 2 or 3 days of headache, cough, body ache, fever.  She is here for Covid testing.  No known exposure to Covid.  She is not Covid vaccinated. No nausea or vomiting No shortness of breath No underlying lung disease or cigarette smoking  Past Medical History:  Diagnosis Date  . S/P VP shunt     There are no problems to display for this patient.   Past Surgical History:  Procedure Laterality Date  . VENTRICULOPERITONEAL SHUNT      OB History   No obstetric history on file.      Home Medications    Prior to Admission medications   Not on File    Family History Family History  Problem Relation Age of Onset  . Hypertension Mother   . Cancer Mother   . Hypertension Father     Social History Social History   Tobacco Use  . Smoking status: Never Smoker  . Smokeless tobacco: Never Used  Substance Use Topics  . Alcohol use: Not Currently  . Drug use: Not Currently     Allergies   Patient has no known allergies.   Review of Systems Review of Systems  See HPI Physical Exam Triage Vital Signs ED Triage Vitals  Enc Vitals Group     BP 03/14/20 1333 128/83     Pulse Rate 03/14/20 1333 82     Resp 03/14/20 1333 17     Temp 03/14/20 1333 98.6 F (37 C)     Temp Source 03/14/20 1333 Oral     SpO2 03/14/20 1333 98 %     Weight 03/14/20 1330 122 lb (55.3 kg)     Height 03/14/20 1330 5\' 6"  (1.676 m)     Head Circumference --      Peak Flow --      Pain Score 03/14/20 1330 2     Pain Loc --      Pain Edu? --      Excl. in GC? --    No data found.  Updated Vital Signs BP 128/83 (BP Location: Right Arm)   Pulse 82   Temp 98.6 F (37 C) (Oral)   Resp 17   Ht 5\' 6"  (1.676 m)   Wt 55.3 kg   LMP  02/11/2020   SpO2 98%   BMI 19.69 kg/m      Physical Exam Constitutional:      General: She is not in acute distress.    Appearance: She is well-developed and well-nourished.     Comments: Appears mildly ill/tired  HENT:     Head: Normocephalic and atraumatic.     Mouth/Throat:     Mouth: Oropharynx is clear and moist.     Comments: Mask is in place.  Mucous membranes moist Eyes:     Conjunctiva/sclera: Conjunctivae normal.     Pupils: Pupils are equal, round, and reactive to light.  Cardiovascular:     Rate and Rhythm: Normal rate and regular rhythm.     Heart sounds: Normal heart sounds.  Pulmonary:     Effort: Pulmonary effort is normal. No respiratory distress.     Breath sounds: Normal breath sounds.  Comments: Heart and lung exam normal Abdominal:     General: There is no distension.     Palpations: Abdomen is soft.  Musculoskeletal:        General: No edema. Normal range of motion.     Cervical back: Normal range of motion.  Skin:    General: Skin is warm and dry.  Neurological:     Mental Status: She is alert.  Psychiatric:        Behavior: Behavior normal.      UC Treatments / Results  Labs (all labs ordered are listed, but only abnormal results are displayed) Labs Reviewed  COVID-19, FLU A+B NAA    EKG   Radiology No results found.  Procedures Procedures (including critical care time)  Medications Ordered in UC Medications - No data to display  Initial Impression / Assessment and Plan / UC Course  I have reviewed the triage vital signs and the nursing notes.  Pertinent labs & imaging results that were available during my care of the patient were reviewed by me and considered in my medical decision making (see chart for details).     Importance of quarantine pending test results is reviewed.  Symptomatic treatment reviewed f Final Clinical Impressions(s) / UC Diagnoses   Final diagnoses:  Cough  Viral URI with cough  Encounter for  laboratory testing for COVID-19 virus     Discharge Instructions     Go home to rest Drink plenty of fluids Take Tylenol for pain or fever You may take over-the-counter cough and cold medicines as needed You must quarantine at home until your test result is available You can check for your test result in MyChart    ED Prescriptions    None     PDMP not reviewed this encounter.   Eustace Moore, MD 03/14/20 [phone]

## 2020-03-14 NOTE — ED Triage Notes (Signed)
Body aches, cough, congestion, fever since Tuesday

## 2020-03-14 NOTE — Discharge Instructions (Signed)
Go home to rest Drink plenty of fluids Take Tylenol for pain or fever You may take over-the-counter cough and cold medicines as needed You must quarantine at home until your test result is available You can check for your test result in MyChart  

## 2020-03-17 ENCOUNTER — Ambulatory Visit: Admit: 2020-03-17 | Discharge: 2020-03-17 | Attending: MD | Primary: MD

## 2020-03-17 DIAGNOSIS — F419 Anxiety disorder, unspecified: Secondary | ICD-10-CM

## 2020-03-17 LAB — COVID-19, FLU A+B NAA
Influenza A, NAA: NOT DETECTED
Influenza B, NAA: NOT DETECTED
SARS-CoV-2, NAA: NOT DETECTED

## 2020-03-17 MED ORDER — phentermine 37.5 MG capsule
37.5 | ORAL_CAPSULE | Freq: Every morning | ORAL | 0 refills | Status: AC
Start: 2020-03-17 — End: 2020-04-16

## 2020-03-17 MED ORDER — buPROPion (WELLBUTRIN) 75 MG tablet
75 | ORAL_TABLET | Freq: Two times a day (BID) | ORAL | 3 refills | 90.00000 days | Status: DC
Start: 2020-03-17 — End: 2020-12-15

## 2020-03-17 NOTE — Progress Notes (Signed)
OFFICE VISIT    CC:   Chief Complaint   Patient presents with   . Medication Management       HPI  Rachel [name] is a 35 y.o. female who presents to discuss    Postpartum depression  Previously on Wellbutrin, feels that depression is getting worse and would like to resume this. At home with 3 kids while husband works as a Conservation officer, historic buildings, not a lot of time for self care, feels that her energy level is low and she is gaining weight. Also requesting a short supply of phentermine to give her a jump start to get back into exercising. We discuss that this is effectively a legal stimulant and I am willing to prescribe a very short course only. We also discuss that this could potentially worsen any underlying anxiety.     Patient Active Problem List   Diagnosis SNOMED CT(R)   . Family history of cancer FAMILY HISTORY OF CANCER   . PCOS (polycystic ovarian syndrome) POLYCYSTIC OVARY SYNDROME   . Irregular periods/menstrual cycles IRREGULAR PERIODS   . LLQ pain LEFT LOWER QUADRANT PAIN   . Multigravida MULTIGRAVIDA   . 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 Genetic screening PATIENT ENCOUNTER STATUS   . Chest tightness or pressure CHEST DISCOMFORT   . [redacted] weeks gestation of pregnancy GESTATION PERIOD, 34 WEEKS   . Chest pressure CHEST DISCOMFORT   . 1st degree AV block FIRST DEGREE ATRIOVENTRICULAR BLOCK       Outpatient Medications Marked as Taking for the 03/17/20 encounter (Office Visit) with Veleta Miners, MD   Medication Sig Dispense Refill   . ascorbic acid/collagen hydr (ASCORBIC ACID-COLLAGEN ORAL) Take 3,000 mg by mouth daily.     Graciella Freer MED daily. Med Name: Balance  herbal supplement.     . herbal drugs Cap Take 1 capsule by mouth as needed. Colon cleanse     . metFORMIN (GLUCOPHAGE) 500 MG tablet Take 1 tablet by mouth 3 times daily. 90 tablet 12   . MULTIVITAMIN ORAL Take by  mouth.     . norethindrone-ethinyl estradiol (JUNEL FE 1/20) 1 mg-20 mcg (21)/75 mg (7) per tablet Take 1 tablet by mouth daily. 84 tablet 3       Allergies and past medical, family, and social history were reviewed and updated as needed.    ROS  Negative for visual changes, eye pain, ear pain, headache, dizziness, chest pain, SOB, abdominal pain, N/V, constipation, diarrhea, joint pain, swelling, or rashes other than or in addition to what is discussed above.    Physical Exam:  BP 117/80 (BP Location: Right arm, Patient Position: Sitting)   Pulse 81   Temp 36.7 ?C (98 ?F) (Temporal)   Resp 16   Ht 5' 5 (1.651 m)   Wt 174 lb 12.8 oz (79.3 kg)   SpO2 100%   BMI 29.09 kg/m?     Gen: nontoxic appearing female with above stated BMI in NAD, on RA  HEENT: NCAT, EOMI, no conjunctival injection or discharge  CV: RRR  Lungs: normal work of breathing on room air  Lower ext: warm and well perfused, no edema noted bilaterally  Neuro: AOx4, CNII-XII grossly intact, moves all extremities, otherwise nonfocal  Psych: affect appropriate, mood congruent    Assessment and Plan  [name] was seen today for medication management.    Diagnoses and all orders for this visit:  Postpartum depression  BMI 29  Acute on chronic, previously responded well to Wellbutrin, will resume. Short course of phentermine prescribed as requested, no further refills, advised starting a regular exercise routine to improve mood and maintain healthy weight.   -     buPROPion (WELLBUTRIN) 75 MG tablet; Take 1 tablet by mouth 2 times daily.  -     phentermine 37.5 MG capsule; Take 1 capsule by mouth every morning.      Veleta Miners, MD

## 2020-03-17 NOTE — Progress Notes (Signed)
Rachel [name] is a 35 y.o. female who presents today for discuss Wellbutrin. Has been taking 1.5 tablets of 150 mg x several weeks but will need a new presicription.    BP: 117/80 / Pulse: 81 / SpO2: 100 % / Temp: 36.7 ?C (98 ?F)    Screening:   Depression: PHQ-9: 8 Mild Depression (03/17/2020)  Substance Use: EtOH - EtOH SBIRT Negative (03/17/2020)  Drug - Drug SBIRT Negative (03/17/2020)  Fall Screen: N/A  Cognitive Assessment:         No exam data present      Health Maintenance:  Health Maintenance Due   Topic Date Due   . Cervical  Never done   . Influenza Vaccine (1) 11/12/2019   . COVID-19 Vaccine (2 - Booster for Janssen series) 12/24/2019           Ambulatory Nursing Form

## 2020-04-20 ENCOUNTER — Inpatient Hospital Stay: Admit: 2020-04-20 | Discharge: 2020-05-04 | Attending: MD | Primary: MD

## 2020-04-20 ENCOUNTER — Other Ambulatory Visit: Admit: 2020-04-20 | Primary: MD

## 2020-04-20 DIAGNOSIS — O200 Threatened abortion: Secondary | ICD-10-CM

## 2020-04-20 LAB — BETA-HCG, SERUM, QUANTITATIVE: hCG Quantitative (Serum): 4330 m[IU]/mL — ABNORMAL HIGH (ref 0.0–5.0)

## 2020-04-20 NOTE — Telephone Encounter (Signed)
Spoke with Rachel [name], patient will need to complete a HcG quantitative test prior to imaging. Patient was informed via outpatient scheduling. Lab has been ordered.

## 2020-04-20 NOTE — Telephone Encounter (Signed)
Spoke with outpatient scheduling, they stated the referral fr the ultrasound would need to be changed to STAT to get the patient in before Tuesday, new order placed for STAT US. zk

## 2020-04-20 NOTE — Telephone Encounter (Signed)
4:10PM - Rachel [name] called back to let us know her PCP put in an order today and she just had the ultrasound done. The tech suggested she have another ultrasound done in another week so would Dr Laurence Compton still place an order for her.   MMA

## 2020-04-20 NOTE — Addendum Note (Signed)
Addended by: Conni Elliot on: 04/20/2020 04:11 PM     Modules accepted: Orders

## 2020-04-20 NOTE — Telephone Encounter (Signed)
From: Geralynn Ochs  To: Veleta Miners, MD  Sent: 04/20/2020 7:47 AM PST  Subject: Ultrasound    Hey Dr. Marcos Eke, I took some pregnancy test last week and I got like 7 positive. Well, this morning I woke up bleeding. Could you possibly order me a ultrasound outpatient so I can see what is going on?     Shanda Bumps

## 2020-04-20 NOTE — Telephone Encounter (Signed)
10:[name] called requesting Dr Laurence Compton order an out-patient ultrasound.   Lovinia thinks she is about 5 1/[redacted] weeks pregnant. LMP 03/13/20. She has some light but dark bleeding think morning. She has no insurance at this time so cannot afford an appt. She is working on Museum/gallery curator in place and will schedule to see Dr Laurence Compton then. She would like to have an ultrasound done to be sure everything is okay.  If any questions, please call [name] back at [phone]

## 2020-04-20 NOTE — Addendum Note (Signed)
Addended by: Richardo Priest on: 04/20/2020 11:23 AM     Modules accepted: Orders

## 2020-04-21 NOTE — Telephone Encounter (Signed)
-----   Message from Neville. Dokes sent at 04/21/2020 10:41 AM PST -----  Regarding: Ultrasound  I called to try and schedule an ultrasound and they said they don't have an order, I was wondering if you could put that in so I can schedule one next week. Thank you do much.

## 2020-04-21 NOTE — Telephone Encounter (Signed)
See other telephone note.  

## 2020-04-21 NOTE — Telephone Encounter (Signed)
10:40AM [name] with [facility] OP Scheduling called. [name] states that Rachel [name] called and said that Dr Laurence Compton suggested that she have a second ultrasound in a week based on what was seen on the ultrasound yesterday.   I told [name] with [facility] scheduling that Rachel [name]'s PCP had sent in the order for her and the patient told me that it was the Ultrasound tech that suggested another ultrasound. [name] will call the PCP office and ask for the ordering providers suggestion for a second ultrasound. MMA

## 2020-04-21 NOTE — Telephone Encounter (Signed)
Ultrasound order is placed.

## 2020-04-21 NOTE — Telephone Encounter (Signed)
Rachel [name] called back. She is asking that Dr Laurence Compton put in the order for the ultrasound. Meaghann states that Dr Laurence Compton called her last night and said he would place the order but scheduling tells her there is no order placed.   PCP - Veleta Miners did call the pt to let her know of the results of her first ultrasound and said she would let Dr Laurence Compton to take over from here for the pregnancy.   Pt states she is actively working on getting [employer] but for now is still self pay.   MMA

## 2020-04-23 ENCOUNTER — Emergency Department (HOSPITAL_COMMUNITY): Payer: Self-pay

## 2020-04-23 ENCOUNTER — Emergency Department (HOSPITAL_COMMUNITY)
Admission: EM | Admit: 2020-04-23 | Discharge: 2020-04-23 | Disposition: A | Discharge status: Home / Self Care | Payer: Self-pay | Attending: Emergency Medicine | Admitting: Emergency Medicine

## 2020-04-23 ENCOUNTER — Other Ambulatory Visit: Payer: Self-pay

## 2020-04-23 ENCOUNTER — Encounter (HOSPITAL_COMMUNITY): Payer: Self-pay | Admitting: *Deleted

## 2020-04-23 DIAGNOSIS — S5002XA Contusion of left elbow, initial encounter: Secondary | ICD-10-CM

## 2020-04-23 DIAGNOSIS — W108XXA Fall (on) (from) other stairs and steps, initial encounter: Secondary | ICD-10-CM | POA: Insufficient documentation

## 2020-04-23 DIAGNOSIS — S59902A Unspecified injury of left elbow, initial encounter: Secondary | ICD-10-CM | POA: Insufficient documentation

## 2020-04-23 DIAGNOSIS — S7012XA Contusion of left thigh, initial encounter: Secondary | ICD-10-CM | POA: Insufficient documentation

## 2020-04-23 DIAGNOSIS — S6992XA Unspecified injury of left wrist, hand and finger(s), initial encounter: Secondary | ICD-10-CM | POA: Insufficient documentation

## 2020-04-23 MED ORDER — IBUPROFEN 400 MG PO TABS
600.0000 mg | ORAL_TABLET | Freq: Once | ORAL | Status: AC
  Administered 2020-04-23: 600 mg via ORAL
  Filled 2020-04-23: qty 2

## 2020-04-23 NOTE — ED Notes (Signed)
Entered room and introduced self to patient. Pt appears to be resting in bed, respirations are even and unlabored with equal chest rise and fall. Bed is locked in the lowest position, side rails x2, call bell within reach. Pt educated on call light use and hourly rounding, verbalized understanding and in agreement at this time. All questions and concerns voiced addressed. Refreshments offered and provided per patient request.  Will continue to monitor.   

## 2020-04-23 NOTE — ED Provider Notes (Signed)
[facility] EMERGENCY DEPARTMENT Provider Note   CSN: [id number] Arrival date & time: 04/23/20  1823     History Chief Complaint  Patient presents with  . Fall    Tracey Young is a 35 y.o. female presenting to the emergency department with left elbow, wrist, thigh pain after mechanical fall down the steps today.  She does not believe she hit her head.  She is now having any neck or back pain.  Her pain is mostly to her elbow wrist and has bruising to her thigh.  Feels that her wrist is tight and has pain in her wrist with movement of her thumb.  No difficulty walking.  Not on anticoagulation.  The history is provided by the patient.       Past Medical History:  Diagnosis Date  . S/P VP shunt     There are no problems to display for this patient.   Past Surgical History:  Procedure Laterality Date  . VENTRICULOPERITONEAL SHUNT       OB History   No obstetric history on file.     Family History  Problem Relation Age of Onset  . Hypertension Mother   . Cancer Mother   . Hypertension Father     Social History   Tobacco Use  . Smoking status: Never Smoker  . Smokeless tobacco: Never Used  Substance Use Topics  . Alcohol use: Not Currently  . Drug use: Not Currently    Home Medications Prior to Admission medications   Not on File    Allergies    Patient has no known allergies.  Review of Systems   Review of Systems  Musculoskeletal: Positive for arthralgias and myalgias.  Skin: Positive for color change.    Physical Exam Updated Vital Signs BP 113/84 (BP Location: Right Arm)   Pulse 87   Temp 98.2 F (36.8 C)   Resp 16   SpO2 100%   Physical Exam Vitals and nursing note reviewed.  Constitutional:      General: She is not in acute distress.    Appearance: She is well-developed.  HENT:     Head: Normocephalic and atraumatic.  Eyes:     Conjunctiva/sclera: Conjunctivae normal.  Cardiovascular:     Rate and Rhythm: Normal rate and  regular rhythm.  Pulmonary:     Effort: Pulmonary effort is normal. No respiratory distress.     Breath sounds: Normal breath sounds.  Musculoskeletal:     Cervical back: Normal range of motion and neck supple. No tenderness.     Comments: No midline spinal or paraspinal tenderness Left elbow with mild bruising to the posterior aspect.  Some bony tenderness to the olecranon process.  No significant swelling is noted.  Full normal range of motion. Left wrist with normal range of motion.  Does have anatomical snuffbox tenderness and pain in this region with range of motion.  Normal distal sensation. Left proximal lateral thigh with contusions noted.  Normal weightbearing  Neurological:     Mental Status: She is alert.  Psychiatric:        Mood and Affect: Mood normal.        Behavior: Behavior normal.     ED Results / Procedures / Treatments   Labs (all labs ordered are listed, but only abnormal results are displayed) Labs Reviewed - No data to display  EKG None  Radiology DG Elbow Complete Left  Result Date: 04/23/2020 CLINICAL DATA:  Fall, left elbow and wrist pain EXAM: LEFT  ELBOW - COMPLETE 3+ VIEW COMPARISON:  None. FINDINGS: There is no evidence of fracture, dislocation, or joint effusion. There is no evidence of arthropathy or other focal bone abnormality. Minimal thickening superficial to the olecranon is likely contusive, correlate for point tenderness. IMPRESSION: No acute fracture or dislocation. Minimal thickening superficial to the olecranon is likely contusive, correlate for point tenderness. Electronically Signed   By: Kreg Shropshire M.D.   On: 04/23/2020 19:26   DG Wrist Complete Left  Result Date: 04/23/2020 CLINICAL DATA:  Status post fall. EXAM: LEFT WRIST - COMPLETE 3+ VIEW COMPARISON:  None. FINDINGS: There is no evidence of fracture or dislocation. There is no evidence of arthropathy or other focal bone abnormality. Soft tissues are unremarkable. IMPRESSION:  Negative. Electronically Signed   By: Aram Candela M.D.   On: 04/23/2020 19:26    Procedures Procedures   Medications Ordered in ED Medications  ibuprofen (ADVIL) tablet 600 mg (has no administration in time range)    ED Course  I have reviewed the triage vital signs and the nursing notes.  Pertinent labs & imaging results that were available during my care of the patient were reviewed by me and considered in my medical decision making (see chart for details).    MDM Rules/Calculators/A&P                          Patient with mechanical fall down the steps, complaining of left elbow wrist and bruising to the left thigh.  X-rays are negative for acute fracture.  She does have tenderness in the anatomical snuffbox and some pain in this region with range of motion.  Will place in thumb spica as a precaution, suspect wrist sprain versus strain.  Contusion to the elbow and thigh.  Recommend RICE therapy, NSAIDs for pain.  Will provide outpatient follow-up for her wrist for repeat imaging in 1 to 2 weeks.  Discussed results, findings, treatment and follow up. Patient advised of return precautions. Patient verbalized understanding and agreed with plan.  Final Clinical Impression(s) / ED Diagnoses Final diagnoses:  Contusion of left elbow, initial encounter  Injury of left wrist, initial encounter  Contusion of left thigh, initial encounter    Rx / DC Orders ED Discharge Orders    None       Roy Snuffer, [place] N, PA-C 04/23/20 2114    Bethann Berkshire, MD 04/25/20 [phone]

## 2020-04-23 NOTE — ED Notes (Signed)
ED Provider at bedside. 

## 2020-04-23 NOTE — Discharge Instructions (Addendum)
Apply ice to areas of pain for 20 minutes at a time. Take ibuprofen every 6 hours for pain and swelling.  Elevate your arm to help with swelling as well. Schedule appointment with the hand doctor in 1 to 2 weeks for repeat imaging.

## 2020-04-23 NOTE — ED Triage Notes (Cosign Needed)
Fell down steps at home, pain in left elbow and wrist, also has pain in thigh area

## 2020-04-29 ENCOUNTER — Inpatient Hospital Stay: Admit: 2020-04-29 | Discharge: 2020-05-18 | Attending: MD | Primary: MD

## 2020-04-29 DIAGNOSIS — O209 Hemorrhage in early pregnancy, unspecified: Secondary | ICD-10-CM

## 2020-05-05 ENCOUNTER — Encounter: Primary: MD

## 2020-05-19 ENCOUNTER — Ambulatory Visit: Admit: 2020-05-19 | Discharge: 2020-05-19 | Attending: MD | Primary: MD

## 2020-05-19 ENCOUNTER — Other Ambulatory Visit: Admit: 2020-05-20 | Primary: MD

## 2020-05-19 DIAGNOSIS — Z3481 Encounter for supervision of other normal pregnancy, first trimester: Secondary | ICD-10-CM

## 2020-05-19 LAB — VDRL/RPR: VDRL / RPR: NONREACTIVE

## 2020-05-19 LAB — URINALYSIS, MACROSCOPIC (DIP ONLY) (SLM POC)
Bilirubin, Urine: NEGATIVE
Blood, Urine: NEGATIVE
Glucose, Urine: NEGATIVE mg/dL
Ketones, Urine: NEGATIVE mg/dL
Leukocyte Esterase, Urine: NEGATIVE
Nitrite, Urine: NEGATIVE
Protein, Urine: NEGATIVE mg/dL
Specific Gravity, Urine: 1.025 (ref 1.005–1.030)
Urobilinogen, Urine: 0.2 mg/dL (ref 0.0–2.0)
pH, UA: 5.5 (ref 5.0–7.5)

## 2020-05-19 LAB — CBC WITH AUTO DIFFERENTIAL
Basophils %: 0 % (ref 0–2)
Basophils, Absolute: 0 10*3/??L (ref 0.0–0.1)
Eosinophils %: 0 % (ref 0–5)
Eosinophils, Absolute: 0 10*3/??L (ref 0.0–0.4)
HCT: 40.3 % (ref 35.0–48.0)
Hemoglobin: 14 g/dL (ref 11.7–16.5)
Lymphocytes %: 23 % (ref 20–40)
Lymphocytes, Absolute: 1.7 10*3/??L (ref 1.0–4.0)
MCH: 29.5 pg (ref 28.3–33.3)
MCHC: 34.6 g/dL (ref 32.5–36.0)
MCV: 85.3 fL (ref 81.0–100.0)
MPV: 8.8 fL (ref 6.9–10.0)
Monocytes %: 6 % (ref 2–12)
Monocytes, Absolute: 0.4 10*3/??L (ref 0.2–1.0)
Neutrophils %: 71 % (ref 50–74)
Neutrophils, Absolute: 5.3 10*3/??L (ref 2.0–7.4)
Platelet Count: 191 10*3/??L (ref 150–405)
RBC: 4.72 10*6/??L (ref 3.80–5.60)
RDW: 12.7 % (ref 11.7–16.1)
WBC: 7.5 10*3/??L (ref 4.5–11.0)

## 2020-05-19 LAB — CHLAMYDIA/GC PCR, URINE
Chlamydia trachomatis by PCR: NOT DETECTED
Neisseria gonorrhoeae by PCR: NOT DETECTED

## 2020-05-19 LAB — RUBELLA ANTIBODY, IGG: Rubella, IgG: 16 IU/mL — ABNORMAL HIGH (ref <10)

## 2020-05-19 LAB — GLYCO-HEMOGLOBIN A1C: Glycohemoglobin (A1c): 4.7 % (ref <=5.6)

## 2020-05-19 LAB — HIV 1,2 AB/AG SCREEN
HIV antigen: NEGATIVE
HIV-1, 2 Ab Screen (Rapid): NEGATIVE

## 2020-05-19 LAB — HEPATITIS B SURFACE ANTIGEN (SLM): Hepatitis B Surface Antigen Interpretation: NONREACTIVE

## 2020-05-19 LAB — ANTIBODY SCREEN (HCLL): Antibody Screen: NEGATIVE

## 2020-05-19 LAB — URINE CULTURE (SLM)

## 2020-05-19 NOTE — Progress Notes (Signed)
Prenatal teaching is done and given prenatal packet.  She is given the ACOG pregnancy and childbirth book.  Encouraged to call clinic if has any questions or concerns.  Blood drawn via venipuncture from left antecubital.  Patient tolerated well.

## 2020-05-19 NOTE — Progress Notes (Signed)
Patient is here for her initial OB visit.  States, she is feeling fine.  No issues or concerns.  Had bleeding at 5 weeks diagnosis with sub chronic hemorrhage.

## 2020-05-19 NOTE — Progress Notes (Addendum)
Rachel [name] is a 35 y.o. female, DOB [birth year], who presents today for her first OB appointment.      History of Present Illness  HPI   Rachel [name] is a 35 year old [phone] at [redacted]w[redacted]d who presents for her first OB appointment.  She has minimal nausea.  She denies any abnormal vaginal discharge, bleeding, or pelvic cramping.  She has no complaints at this time.         Review of Systems     Constitutional:  Denies fevers or chills  Neurologic:  Denies headaches or neuropathy  CV:  Denies chest pain or palpitations  PULM:  Denies shortness of breath or cough  GI:  Denies diarrhea or constipation  GU:  Denies dysuria or hematuria  Ext:  Denies any musculoskeletal issues    Vitals:    05/19/20 1045   BP: 118/72   Pulse: 98   Temp: 37.4 ?C (99.4 ?F)   SpO2: 97%    Body mass index is 28.16 kg/m?Marland Kitchen  Physical Exam    General:  NAD  CV:  RRR  Abdomen:  Soft, nontender, nondistended  Ext:  No calf pain or swelling  Pelvic:  deferred    Prenatal Vitals     Enc. Date GA Prenatal Vitals Urine Albumin/Glucose Edema Prenatal Presentation Dilation/Effacement/Station    05/19/20 [redacted]w[redacted]d 118/72 / 169 lb 3.2 oz (76.7 kg) Negative / Negative None / None  / 154             ASSESSMENT & PLAN:       ICD-10-CM    1. Encounter for supervision of other normal pregnancy in first trimester  Z34.81 Urinalysis, Macroscopic (Dip Only) POC -Routine     CANCELED: Urinalysis, Macroscopic (Dip Only) POC -Routine     CANCELED: Urinalysis, Macroscopic (Dip Only) POC -Routine     35 year old [id number] at [redacted]w[redacted]d, fetal well being is reassuring.    1.  IUP - miscarriage precautions were reviewed              - pt was given the ACOG Pregnancy Book              - safe medications/diet in pregnancy were reviewed in detail              - pt is in nursing school   - husband is a Engineer, civil (consulting), currently traveling    Update:  Cell free DNA testing was normal, it's a girl.  ?  2.  Anti-MTa antibodies              - MFM consult will be placed              - monthly growth and  MCA evaluation for fetal anemia              - may cause fetal anemia in late term babies    3.  History of depression   - pt stopped taking wellbutrin when she found out she was pregnant   - pt has good support system

## 2020-05-30 ENCOUNTER — Emergency Department (HOSPITAL_COMMUNITY): Payer: Self-pay

## 2020-05-30 ENCOUNTER — Other Ambulatory Visit: Payer: Self-pay

## 2020-05-30 ENCOUNTER — Emergency Department (HOSPITAL_COMMUNITY)
Admission: EM | Admit: 2020-05-30 | Discharge: 2020-05-31 | Disposition: A | Discharge status: Home / Self Care | Payer: Self-pay | Attending: Emergency Medicine | Admitting: Emergency Medicine

## 2020-05-30 ENCOUNTER — Encounter (HOSPITAL_COMMUNITY): Payer: Self-pay | Admitting: Emergency Medicine

## 2020-05-30 DIAGNOSIS — T85618A Breakdown (mechanical) of other specified internal prosthetic devices, implants and grafts, initial encounter: Secondary | ICD-10-CM

## 2020-05-30 DIAGNOSIS — Z982 Presence of cerebrospinal fluid drainage device: Secondary | ICD-10-CM | POA: Insufficient documentation

## 2020-05-30 DIAGNOSIS — R55 Syncope and collapse: Secondary | ICD-10-CM | POA: Insufficient documentation

## 2020-05-30 MED ORDER — ACETAMINOPHEN 500 MG PO TABS
1000.0000 mg | ORAL_TABLET | Freq: Once | ORAL | Status: AC
  Administered 2020-05-31: 1000 mg via ORAL
  Filled 2020-05-30: qty 2
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
## 2020-05-31 LAB — I-STAT BETA HCG BLOOD, ED (MC, WL, AP ONLY): I-stat hCG, quantitative: <5 m[IU]/mL (ref <5)

## 2020-05-31 NOTE — ED Notes (Signed)
Pt given hospital gown and instructed to undress from waist up and put on hospital gown. Instructed pt to remove jewelry and any metal.

## 2020-06-02 ENCOUNTER — Ambulatory Visit: Admit: 2020-06-02 | Discharge: 2020-06-02 | Attending: FNP | Primary: MD

## 2020-06-02 DIAGNOSIS — Z3481 Encounter for supervision of other normal pregnancy, first trimester: Secondary | ICD-10-CM

## 2020-06-02 LAB — URINALYSIS, MACROSCOPIC (DIP ONLY) (SLM POC)
Bilirubin, Urine: NEGATIVE
Blood, Urine: NEGATIVE
Glucose, Urine: NEGATIVE mg/dL
Ketones, Urine: NEGATIVE mg/dL
Leukocyte Esterase, Urine: NEGATIVE
Nitrite, Urine: NEGATIVE
Protein, Urine: NEGATIVE mg/dL
Specific Gravity, Urine: 1.02 (ref 1.005–1.030)
Urobilinogen, Urine: 1 mg/dL (ref 0.0–2.0)
pH, UA: 6.5 (ref 5.0–7.5)

## 2020-06-02 MED ORDER — ondansetron (ZOFRAN-ODT) 4 MG disintegrating tablet
4 | ORAL_TABLET | Freq: Three times a day (TID) | ORAL | 3 refills | Status: AC | PRN
Start: 2020-06-02 — End: 2020-06-29

## 2020-06-02 NOTE — Progress Notes (Signed)
Pt is here for 11 week check . Pt is very tired pt feels tired and has  head aches. Also nausea pt stated she has not felt like eating due to nausea.

## 2020-06-02 NOTE — Progress Notes (Signed)
Lunette is a 35 year old [phone] at [redacted]w[redacted]d who presents for a return OB appointment.  Her nausea has increased slightly and she is not able to eat much due to the nausea.  She denies any abnormal vaginal discharge, bleeding, or pelvic cramping.  She has no complaints at this time.       O:  Prenatal Vitals     Enc. Date GA Prenatal Vitals Urine Albumin/Glucose Edema Prenatal Presentation Dilation/Effacement/Station    05/19/20 [redacted]w[redacted]d 118/72 / 169 lb 3.2 oz (76.7 kg) Negative / Negative None / None  / 154      06/02/20 [redacted]w[redacted]d 100/68 / 169 lb (76.7 kg)  None / None  / 160         Number of Fetuses   1    Height   5' 5 (1.651 m)              35 year old [id number] at [redacted]w[redacted]d, fetal well being is reassuring.  ?  1. ?IUP - miscarriage precautions were reviewed  ????????????- pt was given the ACOG Pregnancy Book  ????????????- safe medications/diet in pregnancy were reviewed in detail  ????????????- pt is in nursing school              - husband is a Engineer, civil (consulting), currently traveling   - nausea in pregnancy- zofran sent to pharmacy  ?  2. ?Anti-MTa antibodies  ????????????- MFM consult will be placed  ????????????- monthly growth and MCA evaluation for fetal anemia  ????????????- may cause fetal anemia in late term babies  ?  3.  History of depression              - pt stopped taking wellbutrin when she found out she was pregnant              - pt has good support system  Hal Neer, FNP

## 2020-06-08 ENCOUNTER — Institutional Professional Consult (permissible substitution): Admit: 2020-06-08 | Discharge: 2020-06-08 | Primary: MD

## 2020-06-08 ENCOUNTER — Inpatient Hospital Stay: Admit: 2020-06-08 | Discharge: 2020-06-08 | Attending: Maternal & Fetal Medicine | Primary: MD

## 2020-06-08 ENCOUNTER — Ambulatory Visit: Admit: 2020-06-08 | Discharge: 2020-06-08 | Payer: BLUE CROSS/BLUE SHIELD | Primary: MD

## 2020-06-08 DIAGNOSIS — Z369 Encounter for antenatal screening, unspecified: Secondary | ICD-10-CM

## 2020-06-08 DIAGNOSIS — Z3A12 12 weeks gestation of pregnancy: Secondary | ICD-10-CM

## 2020-06-08 DIAGNOSIS — Z3682 Encounter for antenatal screening for nuchal translucency: Secondary | ICD-10-CM

## 2020-06-08 NOTE — Progress Notes (Signed)
Rachel [name] is a 35 y.o. female who presents today for read only [place].    Pt signed consent form to complete invitae NIPT screening.  Copy of order form also kept for our records.    BP: 102/68 / Pulse: 64 / SpO2: 98 % / Temp: 36.7 ?C (98 ?F)    Body mass index is 28.79 kg/m?Marland Kitchen

## 2020-06-28 ENCOUNTER — Ambulatory Visit: Admit: 2020-06-28 | Discharge: 2020-06-28 | Attending: MD | Primary: MD

## 2020-06-28 DIAGNOSIS — B070 Plantar wart: Secondary | ICD-10-CM

## 2020-06-28 MED ORDER — amoxicillin (AMOXIL) 875 MG tablet
875 | ORAL_TABLET | Freq: Two times a day (BID) | ORAL | 0 refills | Status: AC
Start: 2020-06-28 — End: 2020-07-08
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
## 2020-06-28 NOTE — Patient Instructions (Signed)
Will treat your apparent ear infection with amoxicillin for ten days. Let us know if not effective. Soak your foot once daily and scrape off any loose skin with a file or pumice stone. F/u with your primary in about two weeks asa the wart will likely require multiple treatments

## 2020-06-28 NOTE — Progress Notes (Signed)
Rachel [name] is a 35 y.o. female who presents today for right ear pain x 2 days.    BP: 111/40 / Pulse: 72 / SpO2: 97 % / Temp: 36.9 ?C (98.4 ?F)    Screening:   Depression: PHQ-2: 2 (03/17/2020)  Substance Use: EtOH - EtOH SBIRT Negative (03/17/2020)  Drug - Drug SBIRT Negative (03/17/2020)  Fall Screen: N/A  Cognitive Assessment:         No exam data present      Health Maintenance:  Health Maintenance Due   Topic Date Due   . Cervical  Never done   . COVID-19 Vaccine (2 - Booster for Janssen series) 12/24/2019           Ambulatory Nursing Form

## 2020-06-30 ENCOUNTER — Ambulatory Visit: Admit: 2020-06-30 | Discharge: 2020-08-02 | Attending: MD | Primary: MD

## 2020-06-30 DIAGNOSIS — Z3482 Encounter for supervision of other normal pregnancy, second trimester: Secondary | ICD-10-CM

## 2020-06-30 LAB — URINALYSIS, MACROSCOPIC (DIP ONLY) (SLM POC)
Bilirubin, Urine: NEGATIVE
Blood, Urine: NEGATIVE
Glucose, Urine: NEGATIVE mg/dL
Ketones, Urine: 15 mg/dL — AB
Leukocyte Esterase, Urine: NEGATIVE
Nitrite, Urine: NEGATIVE
Protein, Urine: NEGATIVE mg/dL
Specific Gravity, Urine: 1.02 (ref 1.005–1.030)
Urobilinogen, Urine: 0.2 mg/dL (ref 0.0–2.0)
pH, UA: 5.5 (ref 5.0–7.5)

## 2020-06-30 NOTE — Progress Notes (Signed)
Rachel [name] is a 35 year old [phone] at [redacted]w[redacted]d who presents for a return OB visit.  She denies any abnormal vaginal discharge, bleeding, or pelvic cramping.    O:  Prenatal Vitals     Enc. Date GA Prenatal Vitals Urine Albumin/Glucose Edema Prenatal Presentation Dilation/Effacement/Station    05/19/20 [redacted]w[redacted]d 118/72 / 169 lb 3.2 oz (76.7 kg) Negative / Negative None / None  / 154      06/02/20 [redacted]w[redacted]d 100/68 / 169 lb (76.7 kg) Negative / Negative None / None  / 160      06/08/20 [redacted]w[redacted]d 102/68 / 173 lb (78.5 kg)         06/30/20 [redacted]w[redacted]d 102/68 / 173 lb (78.5 kg) Negative / Negative None / None  / 152         Number of Fetuses   1    Height   5' 5 (1.651 m)              A/P:  35 year old [id number] at [redacted]w[redacted]d, fetal well being is reassuring.    1. ?IUP - miscarriage precautions were reviewed  ????????????- cell free DNA testing was normal   - it's a girl  ????????????- pt is in nursing school              - husband is a Engineer, civil (consulting), currently traveling  ?  2. ?Anti-MTa antibodies  ????????????- previously detected anti-MTa antibodies were not detected (05/19/20)  ?  3.  History of depression              - pt stopped taking wellbutrin when she found out she was pregnant              - pt has good support system

## 2020-06-30 NOTE — Progress Notes (Signed)
Pt here for OB visit. Pt denies any bleeding, LOF, cramping, abnormal discharge.     Pt states she started taking iron again because she was worried about being anemic related to easily bruising and exhaustion. Pt states she has no appetite and is struggling to eat, states food doesn't taste good.

## 2020-07-01 ENCOUNTER — Encounter: Payer: Self-pay | Admitting: Emergency Medicine

## 2020-07-01 ENCOUNTER — Ambulatory Visit
Admission: EM | Admit: 2020-07-01 | Discharge: 2020-07-01 | Disposition: A | Discharge status: Home / Self Care | Payer: Medicaid Other | Attending: Emergency Medicine | Admitting: Emergency Medicine

## 2020-07-01 ENCOUNTER — Other Ambulatory Visit: Payer: Self-pay

## 2020-07-01 DIAGNOSIS — R3989 Other symptoms and signs involving the genitourinary system: Secondary | ICD-10-CM | POA: Insufficient documentation

## 2020-07-01 LAB — POCT URINALYSIS DIP (MANUAL ENTRY)
Bilirubin, UA: NEGATIVE
Blood, UA: NEGATIVE
Glucose, UA: NEGATIVE mg/dL
Ketones, POC UA: NEGATIVE mg/dL
Leukocytes, UA: NEGATIVE
Nitrite, UA: NEGATIVE
Protein Ur, POC: NEGATIVE mg/dL
Spec Grav, UA: 1.025 (ref 1.010–1.025)
Urobilinogen, UA: 0.2 E.U./dL
pH, UA: 5.5 (ref 5.0–8.0)

## 2020-07-01 LAB — POCT URINE PREGNANCY: Preg Test, Ur: NEGATIVE

## 2020-07-01 MED ORDER — PHENAZOPYRIDINE HCL 200 MG PO TABS: 200.0000 mg | ORAL_TABLET | Freq: Three times a day (TID) | ORAL | 0 refills | Status: DC

## 2020-07-01 NOTE — ED Provider Notes (Signed)
[facility]   CC: pressure   SUBJECTIVE:  Tracey Young is a 35 y.o. female who complains of lower abdominal and back discomfort with pressure x 2 days ago.  Patient denies a precipitating event, recent sexual encounter, excessive caffeine intake. Has tried OTC urostat without relief.  Symptoms are made worse with urination.  Admits to similar symptoms in the past with UTI.  Complains of improving nausea.  Denies fever, chills, vomiting, abnormal vaginal discharge, vaginal odor, vaginal itching or bleeding, hematuria.    LMP: Patient's last menstrual period was 06/13/2020.  ROS: As in HPI.  All other pertinent ROS negative.     Past Medical History:  Diagnosis Date  . S/P VP shunt    Past Surgical History:  Procedure Laterality Date  . VENTRICULOPERITONEAL SHUNT     No Known Allergies No current facility-administered medications on file prior to encounter.   No current outpatient medications on file prior to encounter.   Social History   Socioeconomic History  . Marital status: Legally Separated    Spouse name: Not on file  . Number of children: Not on file  . Years of education: Not on file  . Highest education level: Not on file  Occupational History  . Not on file  Tobacco Use  . Smoking status: Never Smoker  . Smokeless tobacco: Never Used  Substance and Sexual Activity  . Alcohol use: Not Currently  . Drug use: Not Currently  . Sexual activity: Not on file  Other Topics Concern  . Not on file  Social History Narrative  . Not on file   Social Determinants of Health   Financial Resource Strain: Not on file  Food Insecurity: Not on file  Transportation Needs: Not on file  Physical Activity: Not on file  Stress: Not on file  Social Connections: Not on file  Intimate Partner Violence: Not on file   Family History  Problem Relation Age of Onset  . Hypertension Mother   . Cancer Mother   . Hypertension Father     OBJECTIVE:  Vitals:    07/01/20 1829  BP: 128/77  Pulse: 79  Resp: 18  Temp: 97.9 F (36.6 C)  TempSrc: Temporal  SpO2: 98%   General appearance: AOx3 in no acute distress HEENT: NCAT.  Oropharynx clear.  Lungs: clear to auscultation bilaterally without adventitious breath sounds Heart: regular rate and rhythm.   Abdomen: soft; non-distended; no tenderness; bowel sounds present; no guarding Back: no CVA tenderness Extremities: no edema; symmetrical with no gross deformities Skin: warm and dry Neurologic: Ambulates from chair to exam table without difficulty Psychological: alert and cooperative; normal mood and affect  Labs Reviewed  URINE CULTURE  POCT URINALYSIS DIP (MANUAL ENTRY)  POCT URINE PREGNANCY    ASSESSMENT & PLAN:  1. Sensation of pressure in bladder area     Meds ordered this encounter  Medications  . phenazopyridine (PYRIDIUM) 200 MG tablet    Sig: Take 1 tablet (200 mg total) by mouth 3 (three) times daily.    Dispense:  6 tablet    Refill:  0    Order Specific Question:   Supervising Provider    Answer:   Eustace Moore [[id number]]   Urine without signs of infection Urine culture sent.  We will call you with the results.   Push fluids and get plenty of rest.   Take pyridium as prescribed and as needed for symptomatic relief Follow up with PCP if symptoms persists Return here  or go to ER if you have any new or worsening symptoms such as fever, worsening abdominal pain, nausea/vomiting, flank pain, etc...  Outlined signs and symptoms indicating need for more acute intervention. Patient verbalized understanding. After Visit Summary given.     Rennis Harding, PA-C 07/01/20 [phone]

## 2020-07-01 NOTE — Discharge Instructions (Signed)
Urine without signs of infection Urine culture sent.  We will call you with the results.   Push fluids and get plenty of rest.   Take pyridium as prescribed and as needed for symptomatic relief Follow up with PCP if symptoms persists Return here or go to ER if you have any new or worsening symptoms such as fever, worsening abdominal pain, nausea/vomiting, flank pain, etc..Marland Kitchen

## 2020-07-01 NOTE — ED Triage Notes (Signed)
Lower abd pain and back pain with pressure x 2 days.  Has taken urostat with no relief.

## 2020-07-03 LAB — URINE CULTURE: Culture: 3000 — AB

## 2020-07-05 NOTE — Congregational Nurse Program (Signed)
Pt attended [facility] to get established with a primary care provider and to receive access to getting an welllness visit.  States they last saw a PCP about  2 years ago.     Pt states chief complaint(s) for wanting to access a medical provider is to receive a wellness check and evaluation of VP shunt for her Hydrocephalus.   She states she has a medical hx  of anxiety, depression and hyrdrocephalus.     Plan Completed on today: [facility] enrollment was completed by [facility] Enrollment/Eligibilty Team  -Referral sent to RN Nurse Case Manager, Norval Gable, for initial and continous medical case managemnt appointment with the [facility] [place].    No Socio-determinant needs at this time identified  -Scheduled medical home appointment at the [facility] [place] July 08, 2020 at 11 am at the [facility] [place].   Instructions regarding appointment details were provided (i.e. bring medicine containers, cost of visit, arrival, etc)

## 2020-07-08 ENCOUNTER — Encounter: Payer: Self-pay | Admitting: Physician Assistant

## 2020-07-08 ENCOUNTER — Other Ambulatory Visit: Payer: Self-pay

## 2020-07-08 ENCOUNTER — Ambulatory Visit: Payer: Medicaid Other | Admitting: Physician Assistant

## 2020-07-08 VITALS — BP 106/70 | HR 88 | Temp 95.9°F | Ht 65.5 in | Wt 120.0 lb

## 2020-07-08 DIAGNOSIS — Z7689 Persons encountering health services in other specified circumstances: Secondary | ICD-10-CM

## 2020-07-08 DIAGNOSIS — Z1239 Encounter for other screening for malignant neoplasm of breast: Secondary | ICD-10-CM

## 2020-07-08 DIAGNOSIS — Z13220 Encounter for screening for lipoid disorders: Secondary | ICD-10-CM

## 2020-07-08 DIAGNOSIS — Z803 Family history of malignant neoplasm of breast: Secondary | ICD-10-CM

## 2020-07-08 NOTE — Progress Notes (Signed)
BP 106/70   Pulse 88   Temp (!) 95.9 F (35.5 C)   Ht 5' 5.5" (1.664 m)   Wt 120 lb (54.4 kg)   LMP 06/13/2020   SpO2 96%   BMI 19.67 kg/m    Subjective:    Patient ID: Tracey Young, female    DOB: Mar 21, 1985, 35 y.o.   MRN: [id number]  HPI: Tracey Young is a 35 y.o. female presenting on 07/08/2020 for No chief complaint on file.   HPI    Pt had a negative covid 19 screening questionnaire.     Pt is 35yoF who presents to establish care.  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  She [place] med[place].  She thinks her Last PAP was 2 yr ago after the birth of her child but epic review shows no PAP result.    She has 6 kids.  She attempted BTL but procedure was not completed.  Review of note states that anatomy was atypical and tubes were not located.  She says she didn't like mirena, didn't do well with ocp or depo.  She is sexually active with someone not her husband, she is separated, going through a divorce.    She says her mother got breast cancer at an early age and she has never had a mammogram.   She is Not vaccinated for covid.  She has no complaints today.        Relevant past medical, surgical, family and social history reviewed and updated as indicated. Interim medical history since our last visit reviewed. Allergies and medications reviewed and updated.  No current outpatient medications on file.    Review of Systems  Per HPI unless specifically indicated above     Objective:    BP 106/70   Pulse 88   Temp (!) 95.9 F (35.5 C)   Ht 5' 5.5" (1.664 m)   Wt 120 lb (54.4 kg)   LMP 06/13/2020   SpO2 96%   BMI 19.67 kg/m   Wt Readings from Last 3 Encounters:  07/08/20 120 lb (54.4 kg)  05/30/20 125 lb (56.7 kg)  03/14/20 122 lb (55.3 kg)    Physical Exam Vitals reviewed.  Constitutional:      General: She is not in acute distress.    Appearance: She is well-developed. She is not ill-appearing.  HENT:     Head: Normocephalic and atraumatic.      Right Ear: Tympanic membrane and ear canal normal.     Left Ear: Tympanic membrane and ear canal normal.     Mouth/Throat:     Pharynx: No oropharyngeal exudate.  Eyes:     Extraocular Movements: Extraocular movements intact.     Conjunctiva/sclera: Conjunctivae normal.     Pupils: Pupils are equal, round, and reactive to light.  Neck:     Thyroid: No thyromegaly.  Cardiovascular:     Rate and Rhythm: Normal rate and regular rhythm.  Pulmonary:     Effort: Pulmonary effort is normal.     Breath sounds: Normal breath sounds.  Abdominal:     General: Bowel sounds are normal.     Palpations: Abdomen is soft. There is no mass.     Tenderness: There is no abdominal tenderness.  Musculoskeletal:     Cervical back: Neck supple.     Right lower leg: No edema.     Left lower leg: No edema.  Lymphadenopathy:     Cervical: No cervical adenopathy.  Skin:  General: Skin is warm and dry.  Neurological:     Mental Status: She is alert and oriented to person, place, and time.     Motor: No weakness or tremor.     Gait: Gait is intact. Gait normal.     Deep Tendon Reflexes:     Reflex Scores:      Patellar reflexes are 2+ on the right side and 2+ on the left side. Psychiatric:        Attention and Perception: Attention normal.        Speech: Speech normal.        Behavior: Behavior normal. Behavior is cooperative.                Assessment & Plan:    Encounter Diagnoses  Name Primary?  . Encounter to establish care Yes  . Screening cholesterol level   . Encounter for screening for malignant neoplasm of breast, unspecified screening modality   . Family history of breast cancer      -will refer for Screening mammogram in light of family history -pt encouraged to contact gyn for contraception and PAP.  She has [place] medicaid.  She is encouraged to use condoms to prevent pregnancy until she sees gyn -pt is educated and encouraged to get covid vacciantion -will Check  cholesterol.  She will be called with results -pt to follow up 1 year.  She is to contact office sooner prn

## 2020-07-15 ENCOUNTER — Other Ambulatory Visit (HOSPITAL_COMMUNITY)
Admission: RE | Admit: 2020-07-15 | Discharge: 2020-07-15 | Disposition: A | Discharge status: Home / Self Care | Payer: Medicaid Other | Source: Ambulatory Visit | Attending: Physician Assistant | Admitting: Physician Assistant

## 2020-07-15 ENCOUNTER — Other Ambulatory Visit: Payer: Self-pay

## 2020-07-15 DIAGNOSIS — Z13220 Encounter for screening for lipoid disorders: Secondary | ICD-10-CM | POA: Insufficient documentation

## 2020-07-15 LAB — LIPID PANEL
Cholesterol: 184 mg/dL (ref 0–200)
HDL: 60 mg/dL (ref >40)
LDL Cholesterol: 114 mg/dL — ABNORMAL HIGH (ref 0–99)
Total CHOL/HDL Ratio: 3.1 RATIO
Triglycerides: 50 mg/dL (ref <150)
VLDL: 10 mg/dL (ref 0–40)

## 2020-07-28 ENCOUNTER — Ambulatory Visit: Admit: 2020-07-28 | Discharge: 2020-07-28 | Attending: MD | Primary: MD

## 2020-07-28 DIAGNOSIS — Z3482 Encounter for supervision of other normal pregnancy, second trimester: Secondary | ICD-10-CM

## 2020-07-28 LAB — URINALYSIS, MACROSCOPIC (DIP ONLY) (SLM POC)
Bilirubin, Urine: NEGATIVE
Blood, Urine: NEGATIVE
Glucose, Urine: NEGATIVE mg/dL
Ketones, Urine: NEGATIVE mg/dL
Leukocyte Esterase, Urine: NEGATIVE
Nitrite, Urine: NEGATIVE
Protein, Urine: NEGATIVE mg/dL
Specific Gravity, Urine: 1.015 (ref 1.005–1.030)
Urobilinogen, Urine: 0.2 mg/dL (ref 0.0–2.0)
pH, UA: 7 (ref 5.0–7.5)

## 2020-07-28 NOTE — Progress Notes (Signed)
Rachel [name] is a 35 year old [phone] at [redacted]w[redacted]d who presents for a return OB visit.  She denies any abnormal vaginal discharge, bleeding, or contractions.    O:    Prenatal Vitals     Enc. Date GA Prenatal Vitals Urine Albumin/Glucose Edema Prenatal Dilation/Effacement/Station Cervical Exam    05/19/20 [redacted]w[redacted]d 118/72 / 169 lb 3.2 oz (76.7 kg) Negative / Negative None / None  / 154      06/02/20 [redacted]w[redacted]d 100/68 / 169 lb (76.7 kg) Negative / Negative None / None  / 160      06/08/20 [redacted]w[redacted]d 102/68 / 173 lb (78.5 kg)         06/30/20 [redacted]w[redacted]d 102/68 / 173 lb (78.5 kg) Negative / Negative None / None  / 152      07/28/20 [redacted]w[redacted]d 108/62 / 174 lb 9.6 oz (79.2 kg) Negative / Negative None / None  / 155         Number of Fetuses   1    Height   5' 5 (1.651 m)              A/P:  35 year old [id number] at [redacted]w[redacted]d, fetal well being is reassuring.    1. ?IUP -?preterm labor precautions were reviewed  ????????????- cell free DNA testing was normal              - it's a girl  ????????????-?pt is in nursing school  ????????????- husband is a Engineer, civil (consulting), currently traveling  ?  2. ?Anti-MTa antibodies  ????????????-?previously detected anti-MTa antibodies were not detected (05/19/20)  ?  3. ?History of depression  ????????????- pt stopped taking wellbutrin when she found out she was pregnant  ????????????- pt has good support system

## 2020-07-28 NOTE — Progress Notes (Signed)
Pt here today for 19 week OB return. Pt denies LOF, abn discharge, consistent contractions, and bleeding. Pt states not feeling fetal movement yet.

## 2020-08-02 ENCOUNTER — Other Ambulatory Visit: Payer: Self-pay

## 2020-08-02 ENCOUNTER — Ambulatory Visit (HOSPITAL_COMMUNITY)
Admission: RE | Admit: 2020-08-02 | Discharge: 2020-08-02 | Disposition: A | Discharge status: Home / Self Care | Payer: Self-pay | Source: Ambulatory Visit | Attending: Physician Assistant | Admitting: Physician Assistant

## 2020-08-02 ENCOUNTER — Encounter (HOSPITAL_COMMUNITY): Payer: Self-pay

## 2020-08-02 DIAGNOSIS — Z1239 Encounter for other screening for malignant neoplasm of breast: Secondary | ICD-10-CM | POA: Insufficient documentation

## 2020-08-02 DIAGNOSIS — Z803 Family history of malignant neoplasm of breast: Secondary | ICD-10-CM | POA: Insufficient documentation

## 2020-08-04 ENCOUNTER — Inpatient Hospital Stay: Admit: 2020-08-04 | Discharge: 2020-08-04 | Attending: Maternal & Fetal Medicine | Primary: MD

## 2020-08-04 ENCOUNTER — Ambulatory Visit: Admit: 2020-08-04 | Discharge: 2020-08-04 | Payer: BLUE CROSS/BLUE SHIELD | Primary: MD

## 2020-08-04 ENCOUNTER — Institutional Professional Consult (permissible substitution): Admit: 2020-08-04 | Discharge: 2020-08-04 | Primary: MD

## 2020-08-04 DIAGNOSIS — Z3689 Encounter for other specified antenatal screening: Secondary | ICD-10-CM

## 2020-08-04 DIAGNOSIS — Z369 Encounter for antenatal screening, unspecified: Secondary | ICD-10-CM

## 2020-08-04 DIAGNOSIS — Z3A20 20 weeks gestation of pregnancy: Secondary | ICD-10-CM

## 2020-08-04 NOTE — Progress Notes (Signed)
Rachel [name] is a 35 y.o. female who presents today for MD visit and [place].    BP: 102/66 / Pulse: 79 / SpO2: 97 % / Temp: 36.7 ?C (98 ?F)    Body mass index is 29.12 kg/m?Marland Kitchen

## 2020-08-05 ENCOUNTER — Other Ambulatory Visit: Payer: Self-pay | Admitting: Physician Assistant

## 2020-08-18 ENCOUNTER — Ambulatory Visit: Admit: 2020-08-18 | Discharge: 2020-08-18 | Attending: FNP | Primary: MD

## 2020-08-18 DIAGNOSIS — Z3482 Encounter for supervision of other normal pregnancy, second trimester: Secondary | ICD-10-CM

## 2020-08-18 LAB — URINALYSIS, MACROSCOPIC (DIP ONLY) (SLM POC)
Bilirubin, Urine: NEGATIVE
Blood, Urine: NEGATIVE
Glucose, Urine: NEGATIVE mg/dL
Ketones, Urine: NEGATIVE mg/dL
Leukocyte Esterase, Urine: NEGATIVE
Nitrite, Urine: NEGATIVE
Protein, Urine: NEGATIVE mg/dL
Specific Gravity, Urine: 1.01 (ref 1.005–1.030)
Urobilinogen, Urine: 0.2 mg/dL (ref 0.0–2.0)
pH, UA: 5.5 (ref 5.0–7.5)

## 2020-08-18 NOTE — Progress Notes (Signed)
Rachel [name] is a 35 year old [phone] at [redacted]w[redacted]d who presents for a return OB visit.  She denies any abnormal vaginal discharge, bleeding, or contractions. Having increased number of headaches, Tylenol resolves the headaches.  ?  O:  Prenatal Vitals     Enc. Date GA Prenatal Vitals Urine Albumin/Glucose Edema Prenatal Dilation/Effacement/Station Cervical Exam    05/19/20 [redacted]w[redacted]d 118/72 / 169 lb 3.2 oz (76.7 kg) Negative / Negative None / None  / 154      06/02/20 [redacted]w[redacted]d 100/68 / 169 lb (76.7 kg) Negative / Negative None / None  / 160      06/08/20 [redacted]w[redacted]d 102/68 / 173 lb (78.5 kg)         06/30/20 [redacted]w[redacted]d 102/68 / 173 lb (78.5 kg) Negative / Negative None / None  / 152      07/28/20 [redacted]w[redacted]d 108/62 / 174 lb 9.6 oz (79.2 kg) Negative / Negative None / None  / 155      08/04/20 [redacted]w[redacted]d 102/66 / 175 lb (79.4 kg)         08/18/20 [redacted]w[redacted]d 118/62 / 179 lb (81.2 kg) Negative / Negative None / None  / 129 / Present         Number of Fetuses   1    Height   5' 5 (1.651 m)              A/P:  35 year old [id number] at [redacted]w[redacted]d, fetal well being is reassuring.  ?  1. ?IUP -?preterm labor precautions were reviewed  ????????????-?cell free DNA testing was normal  ????????????- it's a girl  ????????????-?pt is in nursing school  ????????????- husband is a Engineer, civil (consulting), currently traveling   - 1 hour glucola ordered through lab to be drawn on 09/15/2020 at 26w.  ?  2. ?Anti-MTa antibodies  ????????????-?previously detected anti-MTa antibodies were not detected (05/19/20)  ?  3. ?History of depression  ????????????- pt stopped taking wellbutrin when she found out she was pregnant  ????????????- pt has good support system  [name], FNP

## 2020-08-18 NOTE — Progress Notes (Signed)
Pt is being seen today for her 22 w and 2 day appointment . Pt states she feels baby moving. Pt states no concerns at this time.

## 2020-08-27 NOTE — Progress Notes (Signed)
Triage Note    Subjective    Chief Complaint: abdominal pain    HPI  Rachel [name] is a 35 y.o. [id number] at [redacted]w[redacted]d with Estimated Date of Delivery: 12/18/20 based on LMP. She has been receiving her prenatal care with Dr. Laurence Compton. 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    She presents with abdominal pain that started 2 days ago after her 40 pound child plopped down with his bottom on her belly while at a sporting event.  She endorses an immediate sharp pain over her mid upper abdomen radiating along her left side.  She states her pain on average is 4-5 out of 10 and constant, worse with movement/activities and at times takes my breath away.  Pain is improved with rest.  She states during her last pregnancy she had significant GERD which is what this pain reminds her of; however, she has been on Pepcid for the past 2-1/2 weeks and has gotten no relief.  She endorses good fetal movement and denies any contractions, vaginal bleeding, vaginal discharge, leakage of fluids.    ROS  ? Positive for fetal movement,   ? Negative for contractions, vaginal bleeding, LOF, RUQ pain, vision changes, headache, vaginal discharge, hematuria or dysuria    Obstetrical History  OB History   Gravida Para Term Preterm AB Living   7 3 3   3 3    SAB IAB Ectopic Multiple Live Births   3     0 3      # Outcome Date GA Lbr Len/2nd Weight Sex Delivery Anes PTL Lv   7 Current            6 Term 08/01/19 [redacted]w[redacted]d 05:30 / 00:22 2.75 kg (6 lb 1 oz) F Vag-Spont EPI N LIV   5 Term 07/09/15 [redacted]w[redacted]d  3.104 kg (6 lb 13.5 oz) M Vag-Spont EPI N LIV   4 Term 07/16/06 [redacted]w[redacted]d  3.572 kg (7 lb 14 oz) F Vag-Spont   LIV   3 SAB            2 SAB            1 SAB               Obstetric Comments   #1:  Pupps disease       Gynecological History  No history of abnormal pap smears, last pap 2020. No history of STIs.    Family History  No history of birth anomalies, coagulopathies, or fetal  demise.    Non-Hospital Problem List as of 08/27/2020       Non-Hospital    Family history of cancer    PCOS (polycystic ovarian syndrome)    Irregular periods/menstrual cycles    LLQ pain    Multigravida    Overview     01/07/2015 [redacted]w[redacted]d  Normal NT ultrasound.  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  Prior child anemic at birth, but not transfused nor did that child have jaundice.   Paternal testing isn't available.  Titers are uncertain to be helpful (very limited cases).  Will follow monthly MCA PSV.  Hildred Priest, MD, PhD   02/02/2015 [redacted]w[redacted]d MCA PSV = 1.08 MoM.  Reassess in 4 weeks. Hildred Priest,  MD, PhD   04/21/2015 [redacted]w[redacted]d Normal growth.  MCA PSV = 1.07MoM.  Reassess in 4 weeks. Hildred Priest, MD, PhD   05/19/2015 [redacted]w[redacted]d Normla growth.  MCA PSV = 0.86MoM.  Reassess MCA PSV in 2 weeks.  Fetal growth in 4 weeks. Hildred Priest, MD, PhD   06/02/2015 [redacted]w[redacted]d MCA PSV = 0.94 MoM.  Reassess MCA PSV and growth in 2 weeks. Hildred Priest, MD, PhD   06/18/2015 [redacted]w[redacted]d Normal growth and MCA.  Given GA, recommend weekly BPPs. Mechele Collin, MD  07/01/2015 [redacted]w[redacted]d BPP = 8/8. MCA PSV =0.98 MoM.  Continue with weekly biophysical profiles. No further assessments of MCA Dopplers necessary.    ####G6 2020-21#####  02/12/2019 [redacted]w[redacted]d Single viable intrauterine pregnancy with biometry consistent with clinical dates.  Normal nuchal translucency and early anatomy evaluation.  We discussed monitoring strategy similar to her last pregnancy.  Namely, FAS at 20 weeks, then monthly growth and MCA evaluations to start.  Since there has been no meaningful change in her health status the full formal consultation was not repeated. Hildred Priest, MD, PhD     04/09/2019 [redacted]w[redacted]d normal fetal anatomy survey.  Reassessment of fetal growth in 4 weeks and begin MCA PSV screening at that time. Hildred Priest, MD, PhD     05/07/2019   [redacted]w[redacted]d Fetal growth is appropriate. Normal limited fetal anatomy. MCA PSV is 1.05 MoM with is not consistent with emerging fetal anemia.  Reassessment in 4 weeks. Hildred Priest, MD, PhD     06/06/2019 [redacted]w[redacted]d Fetal growth is appropriate. Normal limited fetal anatomy. Normal AFI. MCA PSV 1.23 MoM. No evidence of fetal anemia.Follow up MCA assessment and BPP in 2 weeks recommended. Mechele Collin, MD   07/09/2019 [redacted]w[redacted]d Fetal growth is appropriate. Normal limited fetal anatomy. Normal AFI. Reassuring  BPP. MCA PSV is normal at 1.37 MoM, but the trend is increasing.  As such, I recommend a repeat BPP with MCA Doppler each week.  Furthermore, with isoimmunization, delivery at 37 weeks is recommended.  Hildred Priest, MD, PhD   07/15/2019 [redacted]w[redacted]d BPP 8/8.  Normal AFI. Reassuring fetal status. MCA PSV  = 1.01MoM Which is improved from last week and does not suggest the emergence of fetal anemia.  Ms. Veiga is complaining today of worsening abdominal pain overnight.  She is unsure if this is contractions or simply exacerbation of pregnancy discomfort.  I have advised her to be evaluated on labor and delivery to exclude labor. Hildred Priest, MD, PhD   07/22/2019 [redacted]w[redacted]d BPP 8/8.  Normal AFI. Reassuring fetal status. MCA PSV does not reflect emerging fetal anemia.  Delivery at 37 weeks. Hildred Priest, MD, PhD            Genetic screening    Overview     First Trimester Screen:   Down Syndrome 1 in 5063  Trisomy 18/13  1 in > 10,000  Free Beta hCG 70  Papp-A 30  AFP 95  Results to [facility]  Letter to patient             Chest tightness or pressure    [redacted] weeks gestation of pregnancy    Chest pressure    1st degree AV block    MFM VISITS 2022    Overview     06/08/2020 [redacted]w[redacted]d Normal nuchal translucency.  Short interval pregnancy.  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  Titers of uncertain utility.  Paternal testing not available.  Plan to reassess  with monthly MCA PSV after the FAS. Weekly testing at 32 weeks. Hildred Priest, MD, PhD   08/04/2020 [redacted]w[redacted]d Normal fetal anatomy.  Normal fetal growth.  Since the antibody screen is now negative, the risk of fetal anemia is essentially zero.  Would recommend that you repeat the assessment of antibody screen at 28 to 30 weeks.  If still negative, then I think she is low risk for complications.  Under those circumstances she could continue on until her due date without extra surveillance unless another complication arises.  She does still have a short interval pregnancy and a growth assessment at 32 weeks is recommended for that indication. Hildred Priest, MD, PhD            Genetic screening    Overview     Negative [employer]                 Past medical history, surgical history, medication list, and allergies reviewed and updated as appropriate.    Social History  Patient denies tobacco, alcohol, THC, or illicit drug use. Lives with husband and 3 kids.    Objective    Vitals  BP: (!) 109/53 / P: 86 / T: 36.8 ?C (98.24 ?F) / RR:      Physical Examination  General: alert, no acute distress  Skin: warm and dry, no rashes  HEENT: pupils equal and equally reactive to light, mucus membranes moist  Pulm: clear to auscultation bilaterally, no wheezes or crackles  Cardiovascular: regular rate and rhythm, no murmurs  Abdomen: nontender gravid abdomen  Lower Extremities: no edema bilaterally    [facility] Investigations  Bedside Ultrasound: deferred  Cervix: deferred  Fetal Heart Tones: baseline 135, moderate variability.  Tocometer: no contractions, some uterine irritability.    Prenatal Labs  Blood Type  O, Rh Positive  Antibody  Negative  Last Hct 05/19/2020: 40.3  Last Hgb 05/19/2020: 14.0  Plt   05/19/2020: 191  Rub   16 (Immune)  RPR   Non-Reactive  HBsAg  Nonreactive  HIV   Negative  TSH   2.25  GC   Not Detected  Chlamydia  Not Detected    Cell free DNA  Normal  1 hr GTT  Not completed    GBS  07/16/2019: Positive for Group B Strep; Group B Streptococci are susceptible to ampicillin,  penicillin, and cefazolin, but may be erythromycin and /or clindamycin resistant.  Contact laboratory if erythromycin and/or clindamycin testing is necessary.  Urine Culture  05/19/2020: Mixed Skin / Genital Flora    Last Ultrasound:  Date: 08/04/20  GA: [redacted]w[redacted]d  Placenta pos:  L anterior, no previa  EFW, %ile:      331 g    Labs  No results found for this or any previous visit (from the past 24 hour(s)).    Assessment / Plan    Rachel [name] is a 35 y.o. female [phone] here at [redacted]w[redacted]d for abdominal pain. 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    #Abdominal pain  Patient presents with 2 days of musculoskeletal abdominal pain over her upper abdomen after her child sat down abruptly on her abdomen.  On exam patient has mid upper abdominal tenderness to palpation with no guarding or rebound.  Fetal heart rate reassuring and no evidence of contractions on tocometer.  Patient has had no vaginal bleeding, discharge and endorses good fetal movement which is all reassuring.  Patient was given Norco 5 and will be sent home with a prescription for 10 additional Norco 5 to be taken as needed.  Patient advised to rest and stay hydrated and to try conservative measures such as a heat pad to relieve pain.  Return precautions given, as below.  Patient agreeable to plan.    Final diagnosis: Abdominal wall muscle contusion    Patient will be discharged home in stable condition with instructions to follow up with her provider.    Return precautions given for vaginal bleeding, loss of fluid, or contractions that are getting more frequent or stronger in consistency.   All maternal questions answered and mom is aware that she can call the [facility] or her provider if she has further questions.     Dr. Laurence Compton notified and agrees with the above plan.    Erin Fulling, MD  08/27/2020, 7:34 PM

## 2020-08-27 NOTE — Telephone Encounter (Signed)
Patient called in because she is having some issues with her stomach, has tried tums and other antacids and nothing has helped.  Spoke with the nurse practitioner and she suggested she go to the birthing center since Dr. Laurence Compton is not available. Called the birthing center and let them know she would be coming up. jh

## 2020-08-28 MED ORDER — HYDROcodone-acetaminophen (NORCO) 5-325 mg per tablet
5-325 | ORAL_TABLET | Freq: Four times a day (QID) | ORAL | 0 refills | 30.00000 days | Status: AC | PRN
Start: 2020-08-28 — End: 2020-09-03

## 2020-08-28 MED ORDER — HYDROcodone-acetaminophen (NORCO) 5-325 mg per tablet 1 tablet
5-325 | Freq: Once | ORAL | Status: AC
Start: 2020-08-28 — End: 2020-08-27
  Administered 2020-08-28: 03:00:00 5-325 via ORAL

## 2020-08-30 NOTE — Telephone Encounter (Signed)
Patient called the after hours line on 6-17 at 5:53PM.  Message states that she has bad heartburn and is taking the prescription. Call was sent to Dr. Laurence Compton. jh

## 2020-08-31 MED ORDER — omeprazole DR (PRILOSEC) 20 MG capsule
20 | ORAL_CAPSULE | Freq: Every day | ORAL | 3 refills | 90.00000 days | Status: DC
Start: 2020-08-31 — End: 2021-06-29

## 2020-08-31 NOTE — Telephone Encounter (Signed)
Dr. Richardo Hanks new rx sent in for omeprazole 20 mg daily.

## 2020-09-13 ENCOUNTER — Encounter (HOSPITAL_COMMUNITY): Payer: Self-pay | Admitting: *Deleted

## 2020-09-13 ENCOUNTER — Emergency Department (HOSPITAL_COMMUNITY)
Admission: EM | Admit: 2020-09-13 | Discharge: 2020-09-13 | Disposition: A | Discharge status: Home / Self Care | Payer: Self-pay | Attending: Emergency Medicine | Admitting: Emergency Medicine

## 2020-09-13 ENCOUNTER — Emergency Department (HOSPITAL_COMMUNITY): Payer: Self-pay

## 2020-09-13 ENCOUNTER — Other Ambulatory Visit: Payer: Self-pay

## 2020-09-13 DIAGNOSIS — G43909 Migraine, unspecified, not intractable, without status migrainosus: Secondary | ICD-10-CM | POA: Insufficient documentation

## 2020-09-13 DIAGNOSIS — U071 COVID-19: Secondary | ICD-10-CM | POA: Insufficient documentation

## 2020-09-13 LAB — CBC
HCT: 34.2 % — ABNORMAL LOW (ref 36.0–46.0)
Hemoglobin: 10.9 g/dL — ABNORMAL LOW (ref 12.0–15.0)
MCH: 26.5 pg (ref 26.0–34.0)
MCHC: 31.9 g/dL (ref 30.0–36.0)
MCV: 83.2 fL (ref 80.0–100.0)
Platelets: 140 10*3/uL — ABNORMAL LOW (ref 150–400)
RBC: 4.11 MIL/uL (ref 3.87–5.11)
RDW: 14 % (ref 11.5–15.5)
WBC: 2.9 10*3/uL — ABNORMAL LOW (ref 4.0–10.5)
nRBC: 0 % (ref 0.0–0.2)

## 2020-09-13 LAB — BASIC METABOLIC PANEL
Anion gap: 7 (ref 5–15)
BUN: 11 mg/dL (ref 6–20)
CO2: 25 mmol/L (ref 22–32)
Calcium: 8.7 mg/dL — ABNORMAL LOW (ref 8.9–10.3)
Chloride: 103 mmol/L (ref 98–111)
Creatinine, Ser: 0.73 mg/dL (ref 0.44–1.00)
GFR, Estimated: >60 mL/min (ref >60)
Glucose, Bld: 100 mg/dL — ABNORMAL HIGH (ref 70–99)
Potassium: 3.6 mmol/L (ref 3.5–5.1)
Sodium: 135 mmol/L (ref 135–145)

## 2020-09-13 LAB — SARS CORONAVIRUS 2 (TAT 6-24 HRS): SARS Coronavirus 2: POSITIVE — AB

## 2020-09-13 MED ORDER — PROCHLORPERAZINE EDISYLATE 10 MG/2ML IJ SOLN
10.0000 mg | Freq: Once | INTRAMUSCULAR | Status: AC
  Administered 2020-09-13: 10 mg via INTRAVENOUS
  Filled 2020-09-13: qty 2

## 2020-09-13 MED ORDER — KETOROLAC TROMETHAMINE 30 MG/ML IJ SOLN
30.0000 mg | Freq: Once | INTRAMUSCULAR | Status: AC
  Administered 2020-09-13: 30 mg via INTRAVENOUS
  Filled 2020-09-13: qty 1

## 2020-09-13 MED ORDER — NAPROXEN 375 MG PO TABS: 375.0000 mg | ORAL_TABLET | Freq: Two times a day (BID) | ORAL | 0 refills | Status: DC

## 2020-09-13 NOTE — Discharge Instructions (Addendum)
Take the medications as needed for headache.  Follow-up on your shunt as previously recommended.  The CT scan did not show any acute changes.  Your COVID test should be available in MyChart later this evening or tomorrow

## 2020-09-13 NOTE — ED Provider Notes (Signed)
[facility] EMERGENCY DEPARTMENT Provider Note   CSN: [id number] Arrival date & time: 09/13/20  6789     History Chief Complaint  Patient presents with   Migraine    Tracey Young is a 35 y.o. female.   Migraine   Patient presents ED for evaluation of headache.  Patient states she started having a headache couple days ago.  She tried taking some Tylenol without relief.  This morning she also had an episode of vomiting and thought she saw some blood in it.  Patient states she has had some body aches and thought she might of had a fever but she never measured one.  She did a home COVID test recently was negative.  She is not having any trouble with cough or congestion.  Patient denies any focal numbness or weakness.  She does not regularly have headaches.  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  Past Medical History:  Diagnosis Date   S/P VP shunt     There are no problems to display for this patient.   Past Surgical History:  Procedure Laterality Date   ABDOMINAL SURGERY  2020   scheduled fallopian tube removal but surgeon couldn't find her tubes   VENTRICULOPERITONEAL SHUNT       OB History   No obstetric history on file.     Family History  Problem Relation Age of Onset   Hypertension Mother    Cancer Mother    Breast cancer Mother    Hypertension Father    Breast cancer Paternal Aunt     Social History   Tobacco Use   Smoking status: Never   Smokeless tobacco: Never  Vaping Use   Vaping Use: Never used  Substance Use Topics   Alcohol use: Not Currently   Drug use: Not Currently    Home Medications Prior to Admission medications   Medication Sig Start Date End Date Taking? Authorizing Provider  naproxen (NAPROSYN) 375 MG tablet Take 1 tablet (375 mg total) by mouth 2 (two) times daily. 09/13/20  Yes Linwood Dibbles, MD    Allergies    Patient has no known allergies.  Review of Systems   Review of Systems  All other systems reviewed and are  negative.  Physical Exam Updated Vital Signs BP 111/75 (BP Location: Right Arm)   Pulse 92   Temp 98.7 F (37.1 C) (Oral)   Resp 20   Ht 1.676 m (5\' 6" )   Wt 53.5 kg   LMP 09/05/2020 (Approximate)   SpO2 100%   BMI 19.05 kg/m   Physical Exam Vitals and nursing note reviewed.  Constitutional:      General: She is not in acute distress.    Appearance: She is well-developed.  HENT:     Head: Normocephalic and atraumatic.     Right Ear: External ear normal.     Left Ear: External ear normal.  Eyes:     General: No scleral icterus.       Right eye: No discharge.        Left eye: No discharge.     Conjunctiva/sclera: Conjunctivae normal.  Neck:     Trachea: No tracheal deviation.  Cardiovascular:     Rate and Rhythm: Normal rate and regular rhythm.  Pulmonary:     Effort: Pulmonary effort is normal. No respiratory distress.     Breath sounds: Normal breath sounds. No stridor. No wheezing or rales.  Abdominal:     General: Bowel sounds are normal.  There is no distension.     Palpations: Abdomen is soft.     Tenderness: There is no abdominal tenderness. There is no guarding or rebound.  Musculoskeletal:        General: No tenderness or deformity.     Cervical back: Neck supple.  Skin:    General: Skin is warm and dry.     Findings: No rash.  Neurological:     General: No focal deficit present.     Mental Status: She is alert.     Cranial Nerves: No cranial nerve deficit (no facial droop, extraocular movements intact, no slurred speech).     Sensory: No sensory deficit.     Motor: No abnormal muscle tone or seizure activity.     Coordination: Coordination normal.  Psychiatric:        Mood and Affect: Mood normal.    ED Results / Procedures / Treatments   Labs (all labs ordered are listed, but only abnormal results are displayed) Labs Reviewed  CBC - Abnormal; Notable for the following components:      Result Value   WBC 2.9 (*)    Hemoglobin 10.9 (*)    HCT  34.2 (*)    Platelets 140 (*)    All other components within normal limits  BASIC METABOLIC PANEL - Abnormal; Notable for the following components:   Glucose, Bld 100 (*)    Calcium 8.7 (*)    All other components within normal limits  SARS CORONAVIRUS 2 (TAT 6-24 HRS)  PREGNANCY, URINE    EKG None  Radiology CT Head Wo Contrast  Result Date: 09/13/2020 CLINICAL DATA:  Headache.  Ventricular shunt. EXAM: CT HEAD WITHOUT CONTRAST TECHNIQUE: Contiguous axial images were obtained from the base of the skull through the vertex without intravenous contrast. COMPARISON:  Head CT 05/31/2020 FINDINGS: Brain: Ventriculostomy catheter from a RIGHT frontal approach terminates within the RIGHT anterior ventricle. No changed in position from comparison exam. No hydrocephalus. No acute intracranial hemorrhage. No focal mass lesion. No CT evidence of acute infarction. No midline shift or mass effect. Basilar cisterns are patent. Vascular: No hyperdense vessel or unexpected calcification. Skull: Normal. Negative for fracture or focal lesion. Sinuses/Orbits: Paranasal sinuses and mastoid air cells are clear. Orbits are clear. Other: None. IMPRESSION: 1. No acute intracranial findings. 2. No change in ventriculostomy catheter compared to prior. 3. No hydrocephalus Electronically Signed   By: Genevive Bi M.D.   On: 09/13/2020 12:01    Procedures Procedures   Medications Ordered in ED Medications  ketorolac (TORADOL) 30 MG/ML injection 30 mg (30 mg Intravenous Given 09/13/20 1218)  prochlorperazine (COMPAZINE) injection 10 mg (10 mg Intravenous Given 09/13/20 1219)    ED Course  I have reviewed the triage vital signs and the nursing notes.  Pertinent labs & imaging results that were available during my care of the patient were reviewed by me and considered in my medical decision making (see chart for details).  Clinical Course as of 09/13/20 1344  Mon Sep 13, 2020  1340 White blood cell count  decreased.  Hemoglobin decreased [JK]  1340 Elec panel unremarkable [JK]  1341 Patient just provided urine sample.  Pregnancy test was not sent.  Patient does not think she could be pregnant.  That would not change management at this point so we will discontinue that order [JK]  1342 CT scan without acute changes [JK]    Clinical Course User Index [JK] Linwood Dibbles, MD   MDM Rules/Calculators/A&P  Patient presented to the ED for evaluation of headaches.  Exam is reassuring.  No findings to suggest meningitis.  CT scan was performed because of patient's history of VP shunt.  CT does not show any acute changes.  Patient has mild anemia but previous record showed that her hemoglobin was 9.8 back in March 2020.  No signs of active bleeding.  Labs are reassuring.  Patient was treated with a migraine cocktail.  Her symptoms have improved.  Covid test was sent off but she otherwise is breathing easily and is not having any respiratory difficulty.  Patient can follow-up on that as an outpatient.   Final Clinical Impression(s) / ED Diagnoses Final diagnoses:  Migraine without status migrainosus, not intractable, unspecified migraine type    Rx / DC Orders ED Discharge Orders          Ordered    naproxen (NAPROSYN) 375 MG tablet  2 times daily        09/13/20 1343             Linwood Dibbles, MD 09/13/20 1346

## 2020-09-13 NOTE — ED Notes (Signed)
Pt reports migraine x2 days, has taken tylenol without any relief. Pain is primary behind eyes and at temples. No lymph swelling noted. Unable to sleep and vomited around 730 this morning and notices a small amount of bright red blood. 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 She has followed up with her doctor regarding getting it fixed, but they did not feel it was necessary. No issues with ICP since the device broke. Neuro WDL

## 2020-09-13 NOTE — ED Triage Notes (Signed)
Pt c/o migraine headache x 2 days, bodyaches, fever and vomiting that started this morning. Pt took Tylenol but then threw it up.

## 2020-09-14 ENCOUNTER — Telehealth: Payer: Self-pay | Admitting: Physician Assistant

## 2020-09-14 NOTE — Telephone Encounter (Signed)
Pt was seen in ED yesterday for headache.  It appears that she was discharged prior to her test results becoming available.  It was noted today that covid test was positive.  Pt was called and notified.  She was counseled on self-isolation and avoiding others.  She will be emailed a note for her employer.  All of her questions were answered.

## 2020-09-15 ENCOUNTER — Other Ambulatory Visit: Admit: 2020-09-15 | Discharge: 2020-09-15 | Primary: MD

## 2020-09-15 DIAGNOSIS — Z3482 Encounter for supervision of other normal pregnancy, second trimester: Secondary | ICD-10-CM

## 2020-09-15 LAB — GLUCOSE, 1 HOUR PERFORMABLE ONLY
Glucose Dose: 50
OB Glucose, GTT - 1 Hour: 82 mg/dL (ref 50–?)

## 2020-09-16 NOTE — Telephone Encounter (Addendum)
Pt advised of note below, verbalized understanding.      ----- Message from [name], FNP sent at 09/16/2020  1:08 AM PDT -----  Please let Rachel [name] know that she passed her 1 hour glucola.  Thank you

## 2020-09-21 ENCOUNTER — Ambulatory Visit: Admit: 2020-09-21 | Discharge: 2020-09-21 | Attending: MD | Primary: MD

## 2020-09-21 DIAGNOSIS — Z3483 Encounter for supervision of other normal pregnancy, third trimester: Secondary | ICD-10-CM

## 2020-09-21 LAB — URINALYSIS, MACROSCOPIC (DIP ONLY) (SLM POC)
Bilirubin, Urine: NEGATIVE
Blood, Urine: NEGATIVE
Glucose, Urine: NEGATIVE mg/dL
Ketones, Urine: NEGATIVE mg/dL
Leukocyte Esterase, Urine: NEGATIVE
Nitrite, Urine: NEGATIVE
Protein, Urine: NEGATIVE mg/dL
Specific Gravity, Urine: <=1.005 (ref 1.005–1.030)
Urobilinogen, Urine: 0.2 mg/dL (ref 0.0–2.0)
pH, UA: 5.5 (ref 5.0–7.5)

## 2020-09-21 NOTE — Progress Notes (Signed)
Pt presents for 27 week and 3 day OB visit. Pt denies any bleeding, LOF, contractions, abnormal discharge. Pt states good fetal movement.

## 2020-09-21 NOTE — Progress Notes (Signed)
Rachel [name] is a 35 year old [phone] at [redacted]w[redacted]d who presents for a return OB visit.  She has good fetal movement.  She denies any abnormal vaginal discharge, bleeding, or leakage of fluid.    O:  Prenatal Vitals     Enc. Date GA Prenatal Vitals Urine Albumin/Glucose Edema Prenatal Dilation/Effacement/Station Cervical Exam    05/19/20 [redacted]w[redacted]d 118/72 / 169 lb 3.2 oz (76.7 kg) Negative / Negative None / None  / 154      06/02/20 [redacted]w[redacted]d 100/68 / 169 lb (76.7 kg) Negative / Negative None / None  / 160      06/08/20 [redacted]w[redacted]d 102/68 / 173 lb (78.5 kg)         06/30/20 [redacted]w[redacted]d 102/68 / 173 lb (78.5 kg) Negative / Negative None / None  / 152      07/28/20 [redacted]w[redacted]d 108/62 / 174 lb 9.6 oz (79.2 kg) Negative / Negative None / None  / 155      08/04/20 [redacted]w[redacted]d 102/66 / 175 lb (79.4 kg)         08/18/20 [redacted]w[redacted]d 118/62 / 179 lb (81.2 kg) Negative / Negative None / None  / 129 / Present      08/27/20 [redacted]w[redacted]d Admission Dept: [facility]    09/21/20 [redacted]w[redacted]d 110/62 / 184 lb 6.4 oz (83.6 kg) Negative / Negative None / None 26 cm / 136 / Present         Number of Fetuses   1    Height   5' 5 (1.651 m)              A/P:  35 year old [id number] at [redacted]w[redacted]d, fetal well being is reassuring.    1. ?IUP -?preterm labor precautions and kick counts were reviewed  ????????????-?cell free DNA testing was normal  ????????????- it's a girl  ????????????-?pt is in nursing school  ????????????- husband is a Engineer, civil (consulting), currently traveling  ?  2. ?Anti-MTa antibodies  ????????????-?previously detected anti-MTa antibodies were not detected (05/19/20)   - will re-screen for anti-MTa antibodies  ?  3. ?History of depression  ????????????- pt stopped taking wellbutrin when she found out she was pregnant  ????????????- pt has good support system

## 2020-09-29 NOTE — Telephone Encounter (Signed)
Dr. Laurence Compton was going to check her titers but she does not see the order in My Chart.  Can he put those in? [facility]

## 2020-09-29 NOTE — Telephone Encounter (Signed)
After discussion with Dr. Laurence Compton, antibody screen order placed. If positive, will send for titer. Pt notified via mychart.

## 2020-10-05 ENCOUNTER — Other Ambulatory Visit: Admit: 2020-10-05 | Discharge: 2020-10-05 | Primary: MD

## 2020-10-05 DIAGNOSIS — R760 Raised antibody titer: Secondary | ICD-10-CM

## 2020-10-05 LAB — ANTIBODY SCREEN (HCLL): Antibody Screen: NEGATIVE

## 2020-10-05 NOTE — Telephone Encounter (Signed)
Pt called in and said that she had a negative antigen test today. She was wondering if Dr. Park Breed should still be monitoring her and if she should keep her next appt. She said she has had a negative antigen test the entire pregnancy.

## 2020-10-05 NOTE — Telephone Encounter (Signed)
Telephone call to patient, message left that I spoke with Dr Park Breed and he is in agreement that you dont need to be followed any extra for the antibody screen but we still do recommend keeping your growth ultrasound appointment next month at 32 weeks due to the short interval between pregnancies

## 2020-10-07 ENCOUNTER — Other Ambulatory Visit: Admit: 2020-10-08 | Primary: MD

## 2020-10-07 ENCOUNTER — Ambulatory Visit: Admit: 2020-10-07 | Discharge: 2020-10-07 | Attending: MD | Primary: MD

## 2020-10-07 DIAGNOSIS — Z3483 Encounter for supervision of other normal pregnancy, third trimester: Secondary | ICD-10-CM

## 2020-10-07 DIAGNOSIS — Z3689 Encounter for other specified antenatal screening: Secondary | ICD-10-CM

## 2020-10-07 LAB — URINALYSIS, MACROSCOPIC (DIP ONLY) (SLM POC)
Bilirubin, Urine: NEGATIVE
Blood, Urine: NEGATIVE
Glucose, Urine: NEGATIVE mg/dL
Ketones, Urine: NEGATIVE mg/dL
Nitrite, Urine: NEGATIVE
Protein, Urine: NEGATIVE mg/dL
Specific Gravity, Urine: <=1.005 (ref 1.005–1.030)
Urobilinogen, Urine: 0.2 mg/dL (ref 0.0–2.0)
pH, UA: 5 (ref 5.0–7.5)

## 2020-10-07 LAB — URINE CULTURE (SLM)

## 2020-10-07 NOTE — Progress Notes (Signed)
Rachel [name] is a 35 year old [phone] at [redacted]w[redacted]d who presents for a return OB visit.  She has good fetal movement.  She denies any abnormal vaginal discharge, bleeding, or leakage of fluid.    O:  Prenatal Vitals     Enc. Date GA Prenatal Vitals Urine Albumin/Glucose Edema Prenatal Dilation/Effacement/Station Cervical Exam    05/19/20 [redacted]w[redacted]d 118/72 / 169 lb 3.2 oz (76.7 kg) Negative / Negative None / None  / 154      06/02/20 [redacted]w[redacted]d 100/68 / 169 lb (76.7 kg) Negative / Negative None / None  / 160      06/08/20 [redacted]w[redacted]d 102/68 / 173 lb (78.5 kg)         06/30/20 [redacted]w[redacted]d 102/68 / 173 lb (78.5 kg) Negative / Negative None / None  / 152      07/28/20 [redacted]w[redacted]d 108/62 / 174 lb 9.6 oz (79.2 kg) Negative / Negative None / None  / 155      08/04/20 [redacted]w[redacted]d 102/66 / 175 lb (79.4 kg)         08/18/20 [redacted]w[redacted]d 118/62 / 179 lb (81.2 kg) Negative / Negative None / None  / 129 / Present      08/27/20 [redacted]w[redacted]d Admission Dept: [facility]    09/21/20 [redacted]w[redacted]d 110/62 / 184 lb 6.4 oz (83.6 kg) Negative / Negative None / None 26 cm / 136 / Present      10/07/20 [redacted]w[redacted]d 110/70 / 186 lb (84.4 kg) Negative / Negative None / None 29 cm / 138 / Present         Number of Fetuses   1    Height   5' 5 (1.651 m)              A/P:  35 year old G7P3033 at [redacted]w[redacted]d, fetal well being is reassuring.    1. ?IUP -?preterm labor?precautions and kick counts were reviewed  ????????????-?cell free DNA testing was normal  ????????????- it's a girl  ????????????-?pt is in nursing school  ????????????- husband is a Engineer, civil (consulting), currently traveling  ?  2. ?Anti-MTa antibodies  ????????????-?previously detected anti-MTa antibodies were not detected 05/19/20     or 10/05/20.              - no further workup needed  ?  3. ?History of depression  ????????????- pt stopped taking wellbutrin when she found out she was pregnant  ????????????- pt has good support system

## 2020-10-07 NOTE — Progress Notes (Signed)
Pt presents for 29 week and 5 day OB visit. Pt denies any bleeding, LOF, contractions, abnormal discharge. Pt states good fetal movement. Pt states she is feeling increased pressure to perineum. Pt declines TDAP at this time.

## 2020-10-21 ENCOUNTER — Ambulatory Visit: Admit: 2020-10-21 | Discharge: 2020-10-21 | Attending: FNP | Primary: MD

## 2020-10-21 ENCOUNTER — Other Ambulatory Visit: Admit: 2020-10-21 | Primary: MD

## 2020-10-21 DIAGNOSIS — Z3483 Encounter for supervision of other normal pregnancy, third trimester: Secondary | ICD-10-CM

## 2020-10-21 DIAGNOSIS — N898 Other specified noninflammatory disorders of vagina: Secondary | ICD-10-CM

## 2020-10-21 LAB — URINALYSIS, MACROSCOPIC (DIP ONLY) (SLM POC)
Bilirubin, Urine: NEGATIVE
Blood, Urine: NEGATIVE
Glucose, Urine: NEGATIVE mg/dL
Ketones, Urine: NEGATIVE mg/dL
Leukocyte Esterase, Urine: NEGATIVE
Nitrite, Urine: NEGATIVE
Protein, Urine: NEGATIVE mg/dL
Specific Gravity, Urine: <=1.005 (ref 1.005–1.030)
Urobilinogen, Urine: 0.2 mg/dL (ref 0.0–2.0)
pH, UA: 5.5 (ref 5.0–7.5)

## 2020-10-21 LAB — WET PREP, GENITAL
Clue Cells: NONE SEEN
Spermatozoa: NONE SEEN
Trichomonas: NONE SEEN

## 2020-10-21 NOTE — Progress Notes (Signed)
Rachel [name] is a 35 year old [phone] at [redacted]w[redacted]d who presents for a return OB visit.  She has good fetal movement.  She denies any abnormal vaginal discharge, bleeding, or leakage of fluid.  She reports a varicose vein that is swollen and painful on right labia.  ?  O:  Prenatal Vitals     Enc. Date GA Prenatal Vitals Urine Albumin/Glucose Edema Prenatal Dilation/Effacement/Station Cervical Exam    05/19/20 [redacted]w[redacted]d 118/72 / 169 lb 3.2 oz (76.7 kg) Negative / Negative None / None  / 154      06/02/20 [redacted]w[redacted]d 100/68 / 169 lb (76.7 kg) Negative / Negative None / None  / 160      06/08/20 [redacted]w[redacted]d 102/68 / 173 lb (78.5 kg)         06/30/20 [redacted]w[redacted]d 102/68 / 173 lb (78.5 kg) Negative / Negative None / None  / 152      07/28/20 [redacted]w[redacted]d 108/62 / 174 lb 9.6 oz (79.2 kg) Negative / Negative None / None  / 155      08/04/20 [redacted]w[redacted]d 102/66 / 175 lb (79.4 kg)         08/18/20 [redacted]w[redacted]d 118/62 / 179 lb (81.2 kg) Negative / Negative None / None  / 129 / Present      08/27/20 [redacted]w[redacted]d Admission Dept: [facility]    09/21/20 [redacted]w[redacted]d 110/62 / 184 lb 6.4 oz (83.6 kg) Negative / Negative None / None 26 cm / 136 / Present      10/07/20 [redacted]w[redacted]d 110/70 / 186 lb (84.4 kg) Negative / Negative None / None 29 cm / 138 / Present      10/21/20 [redacted]w[redacted]d 116/68 / 188 lb (85.3 kg) Negative / Negative None / None 31 cm / 138 / Present         Number of Fetuses   1    Height   5' 5 (1.651 m)              A/P:  35 year old G7P3033 at [redacted]w[redacted]d, fetal well being is reassuring.  ?  1. ?IUP -?preterm labor?precautions?and kick counts?were reviewed  ????????????-?cell free DNA testing was normal  ????????????- it's a girl  ????????????-?pt is in nursing school  ????????????- husband is a Engineer, civil (consulting), currently traveling   - declines Tdap   - discussed tucks pads, preparation H and ice packs for swollen vein on right labia  ?  2. ?Anti-MTa antibodies  ????????????-?previously detected anti-MTa antibodies were not detected 05/19/20                or 10/05/20.  ????????????- no further workup  needed  ?  3. ?History of depression  ????????????- pt stopped taking wellbutrin when she found out she was pregnant  ????????????- pt has good support system  [name], FNP

## 2020-10-21 NOTE — Progress Notes (Signed)
Pt here today for OB return at 31 week 5 days. Pt denies LOF, abn discharge, consistent contractions, and bleeding. Good fetal movement. Varicose veins swelling in vagina since 25 week but slowly worsening.

## 2020-10-27 ENCOUNTER — Ambulatory Visit: Admit: 2020-10-27 | Discharge: 2020-10-27 | Payer: BLUE CROSS/BLUE SHIELD | Primary: MD

## 2020-10-27 ENCOUNTER — Inpatient Hospital Stay: Admit: 2020-10-27 | Discharge: 2020-10-27 | Attending: Maternal & Fetal Medicine | Primary: MD

## 2020-10-27 ENCOUNTER — Institutional Professional Consult (permissible substitution): Admit: 2020-10-27 | Discharge: 2020-10-27 | Primary: MD

## 2020-10-27 DIAGNOSIS — Z3A32 32 weeks gestation of pregnancy: Secondary | ICD-10-CM

## 2020-10-27 DIAGNOSIS — O361939 Maternal care for other isoimmunization, third trimester, other fetus: Secondary | ICD-10-CM

## 2020-10-27 DIAGNOSIS — Z369 Encounter for antenatal screening, unspecified: Secondary | ICD-10-CM

## 2020-10-27 NOTE — Progress Notes (Signed)
Rachel [name] is a 35 y.o. female who presents today for MD Visit and [place].    BP: 114/66 / Pulse: 80 / SpO2: 97 % / Temp: 36.7 ?C (98 ?F)    Body mass index is 31.32 kg/m?Marland Kitchen

## 2020-11-03 ENCOUNTER — Ambulatory Visit: Admit: 2020-11-03 | Discharge: 2020-11-03 | Attending: MD | Primary: MD

## 2020-11-03 DIAGNOSIS — Z3483 Encounter for supervision of other normal pregnancy, third trimester: Secondary | ICD-10-CM

## 2020-11-03 LAB — URINALYSIS, MACROSCOPIC (DIP ONLY) (SLM POC)
Bilirubin, Urine: NEGATIVE
Blood, Urine: NEGATIVE
Glucose, Urine: NEGATIVE mg/dL
Ketones, Urine: NEGATIVE mg/dL
Nitrite, Urine: NEGATIVE
Protein, Urine: NEGATIVE mg/dL
Specific Gravity, Urine: <=1.005 (ref 1.005–1.030)
Urobilinogen, Urine: 0.2 mg/dL (ref 0.0–2.0)
pH, UA: 5 (ref 5.0–7.5)

## 2020-11-03 NOTE — Progress Notes (Signed)
Rachel [name] is a 35 year old [phone] at [redacted]w[redacted]d who presents for a return OB visit.  She has good fetal movement.  She denies any abnormal vaginal discharge, bleeding, or leakage of fluid.    O:    Prenatal Vitals     Enc. Date GA Prenatal Vitals Urine Albumin/Glucose Edema Prenatal Cervical Exam Dilation/Effacement/Station    05/19/20 [redacted]w[redacted]d 118/72 / 169 lb 3.2 oz (76.7 kg) Negative / Negative None / None  / 154      06/02/20 [redacted]w[redacted]d 100/68 / 169 lb (76.7 kg) Negative / Negative None / None  / 160      06/08/20 [redacted]w[redacted]d 102/68 / 173 lb (78.5 kg)         06/30/20 [redacted]w[redacted]d 102/68 / 173 lb (78.5 kg) Negative / Negative None / None  / 152      07/28/20 [redacted]w[redacted]d 108/62 / 174 lb 9.6 oz (79.2 kg) Negative / Negative None / None  / 155      08/04/20 [redacted]w[redacted]d 102/66 / 175 lb (79.4 kg)         08/18/20 [redacted]w[redacted]d 118/62 / 179 lb (81.2 kg) Negative / Negative None / None  / 129 / Present      08/27/20 [redacted]w[redacted]d Admission Dept: [facility]    09/21/20 [redacted]w[redacted]d 110/62 / 184 lb 6.4 oz (83.6 kg) Negative / Negative None / None 26 cm / 136 / Present      10/07/20 [redacted]w[redacted]d 110/70 / 186 lb (84.4 kg) Negative / Negative None / None 29 cm / 138 / Present      10/21/20 [redacted]w[redacted]d 116/68 / 188 lb (85.3 kg) Negative / Negative None / None 31 cm / 138 / Present      10/27/20 [redacted]w[redacted]d 114/66 / 188 lb 3.2 oz (85.4 kg)         11/03/20 [redacted]w[redacted]d 130/82 / 189 lb (85.7 kg) Negative / Negative None / None 33 cm / 134 / Present         Number of Fetuses   1    Height   5' 5 (1.651 m)              A/P:  35 year old G7P3033 at [redacted]w[redacted]d, fetal well being is reassuring.    1. ?IUP -?preterm labor?precautions?and kick counts?were reviewed  ????????????-?cell free DNA testing was normal  ????????????- it's a girl   - anatomy ultrasound was normal, consistent dates  ????????????-?pt is in nursing school  ????????????- husband is a Engineer, civil (consulting), currently traveling  ?  2. ?Anti-MTa antibodies  ????????????-?previously detected anti-MTa antibodies were not detected 05/19/20                or  10/05/20.  ????????????- no further workup needed  ?  3. ?History of depression  ????????????- pt stopped taking wellbutrin when she found out she was pregnant  ????????????- pt has good support system

## 2020-11-03 NOTE — Progress Notes (Signed)
Pt presents for 33 week and 4 day OB visit. Pt denies any bleeding, LOF, contractions, abnormal discharge. Pt states fetal movement has significantly decreased, pt feels she doesn't move every hour.

## 2020-11-14 NOTE — Progress Notes (Signed)
UA dipped pH 5.0, protein glucose and blood negative, ketones 80-160. MD aware.

## 2020-11-14 NOTE — Progress Notes (Signed)
Triage Note    Subjective    Chief Complaint: labial varicosities and concern for DVT    HPI  Rachel [name] is a 35 y.o. [id number] at [redacted]w[redacted]d with Estimated Date of Delivery: 12/18/20 based on LMP. She has been receiving her prenatal care with Dr. Laurence Compton. 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    Rachel [name] presents for evaluation of labial varicosities with increased discomfort, right upper posterior thigh pain, and discomfort in her right leg.  She had labial varicosities in her previous pregnancy in 2021 but they are more prominent and symptomatic in this pregnancy.  She has had periodic discomfort in the past few weeks as well as some isolated right upper leg pain at the base of her right buttock 2 weeks ago but her symptoms have been more pronounced in the past 2 days and have not responded to warm baths.    She also had an episode of shortness of breath when laying on her right side.  With the right leg discomfort and the shortness of breath she wants to be evaluated to make sure she does not have a DVT.    She is not feeling contractions.    On subsequent interview she reports that she believes her shortness of breath was due to anxiety around her symptoms as well as taking care of her children.        ROS  ? Positive for fetal movement,   ? Negative for vaginal bleeding, LOF, RUQ pain, vision changes, headache, vaginal discharge, hematuria or dysuria.    Obstetrical History  OB History   Gravida Para Term Preterm AB Living   7 3 3   3 3    SAB IAB Ectopic Multiple Live Births   3     0 3      # Outcome Date GA Lbr Len/2nd Weight Sex Delivery Anes PTL Lv   7 Current            6 Term 08/01/19 [redacted]w[redacted]d 05:30 / 00:22 2.75 kg (6 lb 1 oz) F Vag-Spont EPI N LIV   5 Term 07/09/15 [redacted]w[redacted]d  3.104 kg (6 lb 13.5 oz) M Vag-Spont EPI N LIV   4 Term 07/16/06 [redacted]w[redacted]d  3.572 kg (7 lb 14 oz) F Vag-Spont   LIV   3 SAB            2 SAB            1 SAB                Obstetric Comments   #1:  Pupps disease       Gynecological History  No history of abnormal pap smears, last pap 2020. No history of STIs.    Family History  No history of birth anomalies, coagulopathies, or fetal demise.    Non-Hospital Problem List as of 11/14/2020       Non-Hospital    Family history of cancer    PCOS (polycystic ovarian syndrome)    Irregular periods/menstrual cycles    LLQ pain    Multigravida    Overview     01/07/2015 [redacted]w[redacted]d  Normal NT ultrasound.  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  Prior child anemic at birth, but not transfused nor  did that child have jaundice.   Paternal testing isn't available.  Titers are uncertain to be helpful (very limited cases).  Will follow monthly MCA PSV.  Hildred Priest, MD, PhD   02/02/2015 [redacted]w[redacted]d MCA PSV = 1.08 MoM.  Reassess in 4 weeks. Hildred Priest, MD, PhD   04/21/2015 [redacted]w[redacted]d Normal growth.  MCA PSV = 1.07MoM.  Reassess in 4 weeks. Hildred Priest, MD, PhD   05/19/2015 [redacted]w[redacted]d Normla growth.  MCA PSV = 0.86MoM.  Reassess MCA PSV in 2 weeks.  Fetal growth in 4 weeks. Hildred Priest, MD, PhD   06/02/2015 [redacted]w[redacted]d MCA PSV = 0.94 MoM.  Reassess MCA PSV and growth in 2 weeks. Hildred Priest, MD, PhD   06/18/2015 [redacted]w[redacted]d Normal growth and MCA.  Given GA, recommend weekly BPPs. Mechele Collin, MD  07/01/2015 [redacted]w[redacted]d BPP = 8/8. MCA PSV =0.98 MoM.  Continue with weekly biophysical profiles. No further assessments of MCA Dopplers necessary.    ####G6 2020-21#####  02/12/2019 [redacted]w[redacted]d Single viable intrauterine pregnancy with biometry consistent with clinical dates.  Normal nuchal translucency and early anatomy evaluation.  We discussed monitoring strategy similar to her last pregnancy.  Namely, FAS at 20 weeks, then monthly growth and MCA evaluations to start.  Since there has been no meaningful change in her health status the full formal consultation  was not repeated. Hildred Priest, MD, PhD     04/09/2019 [redacted]w[redacted]d normal fetal anatomy survey.  Reassessment of fetal growth in 4 weeks and begin MCA PSV screening at that time. Hildred Priest, MD, PhD     05/07/2019  [redacted]w[redacted]d Fetal growth is appropriate. Normal limited fetal anatomy. MCA PSV is 1.05 MoM with is not consistent with emerging fetal anemia.  Reassessment in 4 weeks. Hildred Priest, MD, PhD     06/06/2019 [redacted]w[redacted]d Fetal growth is appropriate. Normal limited fetal anatomy. Normal AFI. MCA PSV 1.23 MoM. No evidence of fetal anemia.Follow up MCA assessment and BPP in 2 weeks recommended. Mechele Collin, MD   07/09/2019 [redacted]w[redacted]d Fetal growth is appropriate. Normal limited fetal anatomy. Normal AFI. Reassuring  BPP. MCA PSV is normal at 1.37 MoM, but the trend is increasing.  As such, I recommend a repeat BPP with MCA Doppler each week.  Furthermore, with isoimmunization, delivery at 37 weeks is recommended.  Hildred Priest, MD, PhD   07/15/2019 [redacted]w[redacted]d BPP 8/8.  Normal AFI. Reassuring fetal status. MCA PSV  = 1.01MoM Which is improved from last week and does not suggest the emergence of fetal anemia.  Ms. Luster is complaining today of worsening abdominal pain overnight.  She is unsure if this is contractions or simply exacerbation of pregnancy discomfort.  I have advised her to be evaluated on labor and delivery to exclude labor. Hildred Priest, MD, PhD   07/22/2019 [redacted]w[redacted]d BPP 8/8.  Normal AFI. Reassuring fetal status. MCA PSV does not reflect emerging fetal anemia.  Delivery at 37 weeks. Hildred Priest, MD, PhD          Genetic screening    Overview     First Trimester Screen:   Down Syndrome 1 in 5063  Trisomy 18/13  1 in > 10,000  Free Beta hCG 70  Papp-A 30  AFP 95  Results to [facility]  Letter to patient           Chest tightness or pressure    [redacted] weeks gestation of pregnancy    Chest pressure    1st degree AV  block    MFM VISITS 2022    Overview     06/08/2020 [redacted]w[redacted]d Normal nuchal translucency.  Short interval pregnancy.  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  Titers of uncertain utility.  Paternal testing not available.  Plan to reassess with monthly MCA PSV after the FAS. Weekly testing at 32 weeks. Hildred Priest, MD, PhD   08/04/2020 [redacted]w[redacted]d Normal fetal anatomy.  Normal fetal growth.  Since the antibody screen is now negative, the risk of fetal anemia is essentially zero.  Would recommend that you repeat the assessment of antibody screen at 28 to 30 weeks.  If still negative, then I think she is low risk for complications.  Under those circumstances she could continue on until her due date without extra surveillance unless another complication arises.  She does still have a short interval pregnancy and a growth assessment at 32 weeks is recommended for that indication. Hildred Priest, MD, PhD   10/27/2020 [redacted]w[redacted]d  Normal interval fetal growth in all parameters measured.   Follow up with MFM as needed.  Hildred Priest, MD, PhD          Genetic screening    Overview     Negative [employer]               Past medical history, surgical history, medication list, and allergies reviewed and updated as appropriate.    Social History  Patient denies tobacco, alcohol, THC, or illicit drug use. Lives with husband and 3 kids.    Objective    Vitals  BP: 121/64 / P: 81 / T: 36.7 ?C (98.1 ?F) / RR: 16 SpO2 96-99%    Physical Examination  General: alert, no acute distress  Skin: warm and dry, no rashes  HEENT: EOM grossly intact, mucus membranes moist  Pulm: Normal WOB on RA.  Cardiovascular: Warm and well perfused  Abdomen: nontender gravid abdomen  Lower Extremities: No edema bilaterally. Right and left calf: 40 cm circumference. Right and left thigh 51 cm circumference. No erythema, warmth, swelling, or palpable cord like vessels..  Posterior upper thigh TTP over hamstring tendon, just inferior to ischial tuberosity.  GU: varicosities of right labia and just inferior to right labia. Not engorged, no significant tenderness or palpable  thrombus.    [facility]  Bedside Ultrasound: Deferred  Cervix: deferred  Fetal Heart Tones: baseline 130, moderate variability, accelerations present, decelerations absent  Tocometer: No contractions.    Prenatal Labs  Blood Type  O, Rh Positive  Antibody  Negative  Last Hct 05/19/2020: 40.3  Last Hgb 05/19/2020: 14.0  Plt   05/19/2020: 191  Rub   16 (Immune)  RPR   Non-Reactive  HBsAg  Nonreactive  HIV   Negative  TSH   2.25  GC   Not Detected  Chlamydia  Not Detected    Cell free DNA  Normal  1 hr GTT  82 (Negative)    GBS  07/16/2019: Positive for Group B Strep; Group B Streptococci are susceptible to ampicillin, penicillin, and cefazolin, but may be erythromycin and /or clindamycin resistant.  Contact laboratory if erythromycin and/or clindamycin testing is necessary.  Urine Culture  10/07/2020: Mixed Skin / Genital Flora    Last Ultrasound:  Date: 10/27/20  GA: [redacted]w[redacted]d  Placenta pos:  anterior  EFW, %ile:      2088 g 62%ile    Labs  No results found for this or any previous visit (from the past 24  hour(s)).    Assessment / Plan    Alya Smaltz is a 35 y.o. female [phone] here at [redacted]w[redacted]d for right labial varicosities, and right posterior thigh pain. 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    Labial varicosities  Patient concern for DVT  Right leg pain  Patient with history of labial varicosities presented for right labial discomfort and right leg pain.  Exam reassuring against DVT, no swelling, erythema, pain, or ropey vessels.  Varicosities evident on exam but not engorged and not tender today.  Patient did have some shortness of breath last night that was positional.  She was maintained on pulse oximetry here in the [facility] and had no desaturations and no recurrent shortness of breath.  Normal heart rate throughout.  She did have some focal tenderness over proximal hamstring tendons just inferior to the ischial tuberosity.  Suspect  possible tendinopathy with potential mild inflammation of adjacent sciatic nerve contributing to right lower extremity symptoms.  While in the [facility] she was given a dose of acetaminophen which provided very mild relief.  -No evidence of DVT on exam.  -Counseled on conservative measures including rest, massage, stretching, ice/heat and acetaminophen.  -Return precautions provided.    Final diagnosis: Labial varicosities, right hamstring tendinopathy    Patient will be discharged home in stable condition with instructions to follow up with her provider, Dr. Laurence Compton on 11/18/20.    Return precautions given for vaginal bleeding, loss of fluid, or contractions that are getting more frequent or stronger in consistency.   All maternal questions answered and mom is aware that she can call the [facility] or her provider if she has further questions.     Dr. Jolyn Nap notified and agrees with the above plan.    Carleene Overlie, MD  11/14/2020, 5:27 PM

## 2020-11-14 NOTE — Progress Notes (Signed)
Discharge orders received, instructions reviewed with pt. Pt instructed to continue ice/heat therapy as tolerated, tylenol, also give massage technique to help release ligaments/fascia tension. Pt discharged home in stable condition.

## 2020-11-15 MED ORDER — glycerin-witch hazeL (TUCKS) pad 1 pad
12.5-50 | TOPICAL | Status: DC | PRN
Start: 2020-11-15 — End: 2020-11-14
  Administered 2020-11-15: 02:00:00 via TOPICAL

## 2020-11-15 MED ORDER — acetaminophen (TYLENOL) tablet 975 mg
325 | ORAL | Status: DC | PRN
Start: 2020-11-15 — End: 2020-11-14
  Administered 2020-11-15: 02:00:00 325 mg via ORAL

## 2020-11-15 MED ORDER — benzocaine-menthol-lanolin-aloe (DERMOPLAST) 20-0.5 % topical spray
20-0.5 | Freq: Four times a day (QID) | TOPICAL | Status: DC | PRN
Start: 2020-11-15 — End: 2020-11-14
  Administered 2020-11-15: 02:00:00 via TOPICAL

## 2020-11-18 ENCOUNTER — Other Ambulatory Visit: Admit: 2020-11-18 | Primary: MD

## 2020-11-18 ENCOUNTER — Ambulatory Visit: Admit: 2020-11-18 | Discharge: 2020-11-18 | Attending: MD | Primary: MD

## 2020-11-18 DIAGNOSIS — Z3685 Encounter for antenatal screening for Streptococcus B: Secondary | ICD-10-CM

## 2020-11-18 DIAGNOSIS — Z3483 Encounter for supervision of other normal pregnancy, third trimester: Secondary | ICD-10-CM

## 2020-11-18 LAB — GROUP B STREP CULTURE

## 2020-11-18 LAB — URINALYSIS, MACROSCOPIC (DIP ONLY) (SLM POC)
Bilirubin, Urine: NEGATIVE
Glucose, Urine: NEGATIVE mg/dL
Ketones, Urine: NEGATIVE mg/dL
Nitrite, Urine: NEGATIVE
Protein, Urine: NEGATIVE mg/dL
Specific Gravity, Urine: 1.015 (ref 1.005–1.030)
Urobilinogen, Urine: 0.2 mg/dL (ref 0.0–2.0)
pH, UA: 7 (ref 5.0–7.5)

## 2020-11-18 NOTE — Progress Notes (Addendum)
Rachel [name] is a 35 year old [phone] at [redacted]w[redacted]d who presents for a return OB visit.  She has good fetal movement.  She denies any abnormal vaginal discharge, bleeding, or leakage of fluid.    O:    Prenatal Vitals     Enc. Date GA Prenatal Vitals Urine Albumin/Glucose Edema Prenatal Cervical Exam Dilation/Effacement/Station    05/19/20 [redacted]w[redacted]d 118/72 / 169 lb 3.2 oz (76.7 kg) Negative / Negative None / None  / 154      06/02/20 [redacted]w[redacted]d 100/68 / 169 lb (76.7 kg) Negative / Negative None / None  / 160      06/08/20 [redacted]w[redacted]d 102/68 / 173 lb (78.5 kg)         06/30/20 [redacted]w[redacted]d 102/68 / 173 lb (78.5 kg) Negative / Negative None / None  / 152      07/28/20 [redacted]w[redacted]d 108/62 / 174 lb 9.6 oz (79.2 kg) Negative / Negative None / None  / 155      08/04/20 [redacted]w[redacted]d 102/66 / 175 lb (79.4 kg)         08/18/20 [redacted]w[redacted]d 118/62 / 179 lb (81.2 kg) Negative / Negative None / None  / 129 / Present      08/27/20 [redacted]w[redacted]d Admission Dept: [facility]    09/21/20 [redacted]w[redacted]d 110/62 / 184 lb 6.4 oz (83.6 kg) Negative / Negative None / None 26 cm / 136 / Present      10/07/20 [redacted]w[redacted]d 110/70 / 186 lb (84.4 kg) Negative / Negative None / None 29 cm / 138 / Present      10/21/20 [redacted]w[redacted]d 116/68 / 188 lb (85.3 kg) Negative / Negative None / None 31 cm / 138 / Present      10/27/20 [redacted]w[redacted]d 114/66 / 188 lb 3.2 oz (85.4 kg)         11/03/20 [redacted]w[redacted]d 130/82 / 189 lb (85.7 kg) Negative / Negative None / None 33 cm / 134 / Present      11/14/20 [redacted]w[redacted]d Admission Dept: [facility]    11/18/20 [redacted]w[redacted]d 110/68 / 191 lb 9.6 oz (86.9 kg) Negative / Negative None / None 35 cm / 132 / Present         Number of Fetuses   1    Height   5' 5 (1.651 m)              A/P:  35 year old G7P3033 at [redacted]w[redacted]d, fetal well being is reassuring.    1. ?IUP -?preterm labor?precautions?and kick counts?were reviewed  ????????????-?cell free DNA testing was normal  ????????????- it's a girl              - anatomy ultrasound was normal, consistent dates   - GBS culture was collected today  ????????????-?pt is in nursing  school  ????????????- husband is a Engineer, civil (consulting), currently traveling   - at patient request, she was set up for an IOL on 12/13/20 at 6 am  ?  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  ????????????-?no further workup needed  ?  3. ?History of depression  ????????????- pt stopped taking wellbutrin when she found out she was pregnant  ????????????- pt has good support system

## 2020-11-18 NOTE — Progress Notes (Signed)
Pt presents for 35 week and 5 day OB visit. Pt denies any bleeding, LOF, contractions, abnormal discharge. Pt states good fetal movement.

## 2020-11-25 ENCOUNTER — Ambulatory Visit: Admit: 2020-11-25 | Discharge: 2020-11-25 | Attending: MD | Primary: MD

## 2020-11-25 DIAGNOSIS — Z3483 Encounter for supervision of other normal pregnancy, third trimester: Secondary | ICD-10-CM

## 2020-11-25 LAB — URINALYSIS, MACROSCOPIC (DIP ONLY) (SLM POC)
Bilirubin, Urine: NEGATIVE
Blood, Urine: NEGATIVE
Glucose, Urine: NEGATIVE mg/dL
Ketones, Urine: NEGATIVE mg/dL
Leukocyte Esterase, Urine: NEGATIVE
Nitrite, Urine: NEGATIVE
Protein, Urine: NEGATIVE mg/dL
Specific Gravity, Urine: 1.015 (ref 1.005–1.030)
Urobilinogen, Urine: 1 mg/dL (ref 0.0–2.0)
pH, UA: 7 (ref 5.0–7.5)

## 2020-11-25 NOTE — Progress Notes (Signed)
Rachel [name] is a 35 year old [phone] at [redacted]w[redacted]d who presents for a return OB visit.  She has good fetal movement.  She denies any abnormal vaginal discharge, bleeding, or leakage of fluid.    O:    Prenatal Vitals     Enc. Date GA Prenatal Vitals Urine Albumin/Glucose Edema Prenatal Cervical Exam Dilation/Effacement/Station    05/19/20 [redacted]w[redacted]d 118/72 / 169 lb 3.2 oz (76.7 kg) Negative / Negative None / None  / 154      06/02/20 [redacted]w[redacted]d 100/68 / 169 lb (76.7 kg) Negative / Negative None / None  / 160      06/08/20 [redacted]w[redacted]d 102/68 / 173 lb (78.5 kg)         06/30/20 [redacted]w[redacted]d 102/68 / 173 lb (78.5 kg) Negative / Negative None / None  / 152      07/28/20 [redacted]w[redacted]d 108/62 / 174 lb 9.6 oz (79.2 kg) Negative / Negative None / None  / 155      08/04/20 [redacted]w[redacted]d 102/66 / 175 lb (79.4 kg)         08/18/20 [redacted]w[redacted]d 118/62 / 179 lb (81.2 kg) Negative / Negative None / None  / 129 / Present      08/27/20 [redacted]w[redacted]d Admission Dept: [facility]    09/21/20 [redacted]w[redacted]d 110/62 / 184 lb 6.4 oz (83.6 kg) Negative / Negative None / None 26 cm / 136 / Present      10/07/20 [redacted]w[redacted]d 110/70 / 186 lb (84.4 kg) Negative / Negative None / None 29 cm / 138 / Present      10/21/20 [redacted]w[redacted]d 116/68 / 188 lb (85.3 kg) Negative / Negative None / None 31 cm / 138 / Present      10/27/20 [redacted]w[redacted]d 114/66 / 188 lb 3.2 oz (85.4 kg)         11/03/20 [redacted]w[redacted]d 130/82 / 189 lb (85.7 kg) Negative / Negative None / None 33 cm / 134 / Present      11/14/20 [redacted]w[redacted]d Admission Dept: [facility]    11/18/20 [redacted]w[redacted]d 110/68 / 191 lb 9.6 oz (86.9 kg) Negative / Negative None / None 35 cm / 132 / Present      11/25/20 [redacted]w[redacted]d 122/68 / 191 lb (86.6 kg) Negative / Negative None / None 36 cm / 135 / Present         Number of Fetuses   1    Height   5' 5 (1.651 m)              A/P:  35 year old G7P3033 at [redacted]w[redacted]d, fetal well being is reassuring.    1. ?IUP -?labor?precautions?and kick counts?were reviewed  ????????????-?cell free DNA testing was normal  ????????????- it's a girl  ????????????- anatomy ultrasound was normal,  consistent dates              - GBS culture was negative  ????????????-?pt is in nursing school  ????????????- husband is a Engineer, civil (consulting), currently traveling              - at patient request, she was set up for an IOL on 12/13/20 at 6 am  ?  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  ????????????-?no further workup needed  ?  3. ?History of depression  ????????????- pt stopped taking wellbutrin when she found out she was pregnant  ????????????- pt has good support system

## 2020-11-25 NOTE — Progress Notes (Signed)
Pt presents for 36weeks5days OB visit. Pt denies any bleeding, LOF, contractions, abnormal discharge. Pt states good fetal movement.

## 2020-12-01 ENCOUNTER — Ambulatory Visit: Admit: 2020-12-01 | Discharge: 2020-12-01 | Attending: MD | Primary: MD

## 2020-12-01 DIAGNOSIS — Z3483 Encounter for supervision of other normal pregnancy, third trimester: Secondary | ICD-10-CM

## 2020-12-01 LAB — URINALYSIS, MACROSCOPIC (DIP ONLY) (SLM POC)
Bilirubin, Urine: NEGATIVE
Blood, Urine: NEGATIVE
Glucose, Urine: NEGATIVE mg/dL
Ketones, Urine: NEGATIVE mg/dL
Nitrite, Urine: NEGATIVE
Protein, Urine: NEGATIVE mg/dL
Specific Gravity, Urine: 1.01 (ref 1.005–1.030)
Urobilinogen, Urine: 0.2 mg/dL (ref 0.0–2.0)
pH, UA: 5.5 (ref 5.0–7.5)

## 2020-12-01 NOTE — Progress Notes (Signed)
Vieva Brummitt is a 35 year old [phone] at [redacted]w[redacted]d who presents for a return OB visit.  She has good fetal movement.  She denies any abnormal vaginal discharge, bleeding, or leakage of fluid.  She has occasional contractions which are mild.  She prefers not to have her cervix checked today.    O:    Prenatal Vitals     Enc. Date GA Prenatal Vitals Urine Albumin/Glucose Edema Prenatal Cervical Exam Dilation/Effacement/Station    05/19/20 [redacted]w[redacted]d 118/72 / 169 lb 3.2 oz (76.7 kg) Negative / Negative None / None  / 154      06/02/20 [redacted]w[redacted]d 100/68 / 169 lb (76.7 kg) Negative / Negative None / None  / 160      06/08/20 [redacted]w[redacted]d 102/68 / 173 lb (78.5 kg)         06/30/20 [redacted]w[redacted]d 102/68 / 173 lb (78.5 kg) Negative / Negative None / None  / 152      07/28/20 [redacted]w[redacted]d 108/62 / 174 lb 9.6 oz (79.2 kg) Negative / Negative None / None  / 155      08/04/20 [redacted]w[redacted]d 102/66 / 175 lb (79.4 kg)         08/18/20 [redacted]w[redacted]d 118/62 / 179 lb (81.2 kg) Negative / Negative None / None  / 129 / Present      08/27/20 [redacted]w[redacted]d Admission Dept: [facility]    09/21/20 [redacted]w[redacted]d 110/62 / 184 lb 6.4 oz (83.6 kg) Negative / Negative None / None 26 cm / 136 / Present      10/07/20 [redacted]w[redacted]d 110/70 / 186 lb (84.4 kg) Negative / Negative None / None 29 cm / 138 / Present      10/21/20 [redacted]w[redacted]d 116/68 / 188 lb (85.3 kg) Negative / Negative None / None 31 cm / 138 / Present      10/27/20 [redacted]w[redacted]d 114/66 / 188 lb 3.2 oz (85.4 kg)         11/03/20 [redacted]w[redacted]d 130/82 / 189 lb (85.7 kg) Negative / Negative None / None 33 cm / 134 / Present      11/14/20 [redacted]w[redacted]d Admission Dept: [facility]    11/18/20 [redacted]w[redacted]d 110/68 / 191 lb 9.6 oz (86.9 kg) Negative / Negative None / None 35 cm / 132 / Present      11/25/20 [redacted]w[redacted]d 122/68 / 191 lb (86.6 kg) Negative / Negative None / None 36 cm / 135 / Present      12/01/20 [redacted]w[redacted]d 128/76 / 192 lb 6.4 oz (87.3 kg) Negative / Negative None / None 37 cm / 140 / Present Vertex        Number of Fetuses   1    Height   5' 5 (1.651 m)              A/P:  35 year old G7P3033 at [redacted]w[redacted]d, fetal  well being is reassuring.    1. ?IUP -?labor?precautions?and kick counts?were reviewed  ????????????-?cell free DNA testing was normal  ????????????- it's a girl  ????????????- anatomy ultrasound was normal, consistent dates  ????????????- GBS culture was negative  ????????????-?pt is in nursing school  ????????????- husband is a Engineer, civil (consulting), currently traveling  ????????????- at patient request, she was set up for an IOL on 12/13/20 at 6 am  ?  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  ????????????-?no further workup needed  ?  3. ?History of depression  ????????????- pt stopped taking wellbutrin when she found out she was  pregnant  ????????????- pt has good support system

## 2020-12-01 NOTE — Progress Notes (Signed)
Pt presents for 37wks4days OB visit. Pt denies any bleeding, LOF, contractions, abnormal discharge. Pt states good fetal movement.

## 2020-12-02 ENCOUNTER — Encounter: Attending: MD | Primary: MD

## 2020-12-06 ENCOUNTER — Emergency Department: Admit: 2020-12-06 | Primary: MD

## 2020-12-06 ENCOUNTER — Emergency Department: Admit: 2020-12-07 | Primary: MD

## 2020-12-06 ENCOUNTER — Inpatient Hospital Stay
Admit: 2020-12-06 | Discharge: 2020-12-07 | Disposition: A | Discharge status: Home / Self Care | Attending: Student in an Organized Health Care Education/Training Program

## 2020-12-06 DIAGNOSIS — O26893 Other specified pregnancy related conditions, third trimester: Secondary | ICD-10-CM

## 2020-12-06 LAB — CBC WITH AUTO DIFFERENTIAL
Basophils %: 0 % (ref 0–2)
Basophils, Absolute: 0 10*3/??L (ref 0.0–0.1)
Eosinophils %: 0 % (ref 0–5)
Eosinophils, Absolute: 0 10*3/??L (ref 0.0–0.4)
HCT: 33.4 % — ABNORMAL LOW (ref 35.0–48.0)
Hemoglobin: 11 g/dL — ABNORMAL LOW (ref 11.7–16.5)
Lymphocytes %: 17 % — ABNORMAL LOW (ref 20–40)
Lymphocytes, Absolute: 1.7 10*3/??L (ref 1.0–4.0)
MCH: 25.1 pg — ABNORMAL LOW (ref 28.3–33.3)
MCHC: 32.8 g/dL (ref 32.5–36.0)
MCV: 76.5 fL — ABNORMAL LOW (ref 81.0–100.0)
MPV: 8.1 fL (ref 6.9–10.0)
Monocytes %: 8 % (ref 2–12)
Monocytes, Absolute: 0.8 10*3/??L (ref 0.2–1.0)
Neutrophils %: 75 % — ABNORMAL HIGH (ref 50–74)
Neutrophils, Absolute: 7.5 10*3/??L — ABNORMAL HIGH (ref 2.0–7.4)
Platelet Count: 141 10*3/??L — ABNORMAL LOW (ref 150–405)
RBC: 4.37 10*6/??L (ref 3.80–5.60)
RDW: 15.7 % (ref 11.7–16.1)
WBC: 10 10*3/??L (ref 4.5–11.0)

## 2020-12-06 LAB — D-DIMER, QUANTITATIVE: D Dimer, Quantitative: 2075 ng/mL — ABNORMAL HIGH (ref <230)

## 2020-12-06 LAB — URINALYSIS WITH MICROSCOPIC, CULTURE IF INDICATED
Bacteria, Urine: NONE SEEN /HPF
Bilirubin, Urine: NEGATIVE
Blood, Urine: NEGATIVE
Glucose, Urine: NEGATIVE mg/dL
Ketones, Urine: NEGATIVE mg/dL
Leukocyte Esterase, Urine: NEGATIVE
Nitrite, Urine: NEGATIVE
Protein, Urine: NEGATIVE mg/dL
RBC, Urine: 1 /HPF (ref 0–4)
Renal/Transitional Epithelial, Urine: <1 /HPF (ref 0–2)
Specific Gravity, Urine: 1.01 (ref 1.005–1.030)
Squamous Epithelial, UA: 7 /HPF — AB (ref 0–4)
Urobilinogen, Urine: 0.2 mg/dL (ref 0.0–2.0)
WBC, Urine: <1 /HPF (ref 0–4)
pH, UA: 5.5 (ref 5.0–7.5)

## 2020-12-06 LAB — COMPREHENSIVE METABOLIC PANEL
ALT - Alanine Aminotransferase: 7 IU/L (ref 5–33)
AST - Aspartate Aminotransferase: 14 IU/L (ref 5–32)
Albumin/Globulin Ratio: 1.2 (ref 1.2–2.2)
Albumin: 3.5 g/dL (ref 3.5–5.0)
Alkaline Phosphatase: 143 IU/L — ABNORMAL HIGH (ref 35–105)
Anion Gap: 12.7 mmol/L (ref 9.0–18.0)
BUN / Creatinine Ratio: 13.6 (ref 12.0–20.0)
BUN: 6 mg/dL — ABNORMAL LOW (ref 8–20)
Bilirubin Total: 0.3 mg/dL (ref 0.10–1.70)
CO2 - Carbon Dioxide: 21 mmol/L (ref 17–27)
Calcium: 8.8 mg/dL (ref 8.6–10.6)
Chloride: 101.3 mmol/L (ref 101.0–111.0)
Creatinine: 0.44 mg/dL — ABNORMAL LOW (ref 0.60–1.30)
Glomerular Filtration Rate Estimate (Female): >90 mL/min/{1.73_m2} (ref >=60)
Glucose: 96 mg/dL (ref 74–106)
Osmolality Calculation: 267.6 mOsm/kg — ABNORMAL LOW (ref 275.0–300.0)
Potassium: 3.56 mmol/L (ref 3.50–5.10)
Protein Total: 6.4 g/dL (ref 6.4–8.2)
Sodium: 135 mmol/L (ref 135–145)

## 2020-12-06 LAB — PRO B-TYPE NATRIURETIC PEPTIDE (SLM): Pro B-Type Natriuretic Peptide: 12 pg/mL (ref 0–450)

## 2020-12-06 LAB — TSH: TSH - Thyroid Stimulating Hormone: 2.64 ??IU/mL (ref 0.34–5.60)

## 2020-12-06 LAB — T4, FREE: T4, Free: 0.89 ng/dL (ref 0.58–1.64)

## 2020-12-06 LAB — TROPONIN T: Troponin T: <0.01 ng/mL (ref 0.00–0.01)

## 2020-12-06 MED ORDER — lactated ringers bolus 1,000 mL
Freq: Once | INTRAVENOUS | Status: AC
Start: 2020-12-06 — End: 2020-12-06
  Administered 2020-12-06 (×2): via INTRAVENOUS

## 2020-12-06 MED ORDER — lactated ringers bolus 500 mL
Freq: Once | INTRAVENOUS | Status: AC
Start: 2020-12-06 — End: 2020-12-06
  Administered 2020-12-07 (×2): via INTRAVENOUS
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
## 2020-12-06 NOTE — ED Notes (Signed)
Pt expressed that she feels like she is having contractions for over an hour. Md was made aware and [facility] called for fetal monitoring.

## 2020-12-06 NOTE — ED Notes (Signed)
Pt taken to CT.

## 2020-12-06 NOTE — Progress Notes (Signed)
Spoke with Selena Batten, RN caring for pt in ER. Informed her that I spoke with Dr. Laurence Compton in regards to [facility] for pt. We will do a NST and as long as it is reactive we will discontinue monitoring. She voiced understanding. Informed her to call us if anything changed and we would be happy to come back down. She voiced understanding.

## 2020-12-06 NOTE — ED Triage Notes (Signed)
Pt voices she has been having episodes of sob and tachycardia with movement rate of 150 bpm at home this AM with just rising from sitting on couch     Pt is G7 P3  38 1/2 weeks currently    Pt denies d/c denies CTX

## 2020-12-06 NOTE — ED Notes (Signed)
FB in room setting pt up on monitor.

## 2020-12-06 NOTE — Telephone Encounter (Signed)
Patient calls and states that she has been having some episodes of shortness of breath lately but she just got up to walk outside and became short of breath and having some chest pain and noted that her heart rate on her watch was 150.     She states it is coming down.  Instructed to go to the birthing center for evaluation.  Patient verbalizes understanding.  Patient is called back after talking to birthing center staff and instructed to go to the ER first to get cleared before going to the birthing center.  Patient verbalizes understanding.

## 2020-12-06 NOTE — Progress Notes (Signed)
Spoke with Dr. Laurence Compton in regards to [facility] for pt, Dr. Laurence Compton states that a reactive NST is acceptable for the pt with her GA and reason visit. Informed him that the [facility] has been reactive thus far and will keep him updated if need. He voiced understanding.

## 2020-12-06 NOTE — ED Notes (Signed)
Pt refused Covid swab at this time

## 2020-12-06 NOTE — ED Notes (Signed)
Pt ambulated with steady gait. Pt with no complaints of dizziness or palpitations.

## 2020-12-06 NOTE — Discharge Instructions (Signed)
You have been seen here today for chest pain, palpitations, and difficulty breathing.  We did rule out pulmonary embolism/blood clot to your lungs as well as pneumonia, etc.  However please follow-up with your primary care physician make an appointment to be seen as soon as possible preferably within the next 48 hours for close outpatient follow-up.  Stay well-hydrated.  Please also follow-up with your OB/GYN as needed, and especially regarding your concern for contractions as they come up.  As we discussed, please return immediately to the emergency department should your symptoms change, worsen, you have worsening chest pain or difficulty breathing, palpitations that worsen or do not stop, lightheadedness or near syncope, etc. for further evaluation and/or treatment.

## 2020-12-07 ENCOUNTER — Inpatient Hospital Stay: Admit: 2020-12-07 | Discharge: 2020-12-28 | Attending: MD | Primary: MD

## 2020-12-07 DIAGNOSIS — O903 Peripartum cardiomyopathy: Secondary | ICD-10-CM

## 2020-12-07 MED ORDER — acetaminophen (TYLENOL) tablet 650 mg
325 | Freq: Once | ORAL | Status: DC
Start: 2020-12-07 — End: 2020-12-07

## 2020-12-07 MED ORDER — magnesium sulfate in water IVPB premix 2 g
2 | Freq: Once | INTRAVENOUS | Status: AC
Start: 2020-12-07 — End: 2020-12-06
  Administered 2020-12-07: 03:00:00 2 g via INTRAVENOUS
  Administered 2020-12-07: 04:00:00 2504 g via INTRAVENOUS

## 2020-12-07 MED ORDER — iopamidoL (ISOVUE-370) 76 % injection 100 mL
370 | Freq: Once | INTRAVENOUS | Status: AC
Start: 2020-12-07 — End: 2020-12-06
  Administered 2020-12-07: 04:00:00 370 mL via INTRAVENOUS

## 2020-12-07 NOTE — Telephone Encounter (Signed)
Message from the answering service. Pt called 12/06/20 at 7:30PM.  Message reads: I am in the ER and have questions on a test they want to do.    *Sent to Dr Laurence Compton and staff as an Lorain Childes.

## 2020-12-07 NOTE — Telephone Encounter (Signed)
Pt called clinic this morning stating that she was in ED yesterday. Pt stated that they refused to do an Echo test on her inpatient and they told her she had to get it done outpatient. Pt was wondering if we could schedule that for her? Spoke with Dr.Barton and let him know what pt was requesting. After speaking too Dr.Barton, I let pt know that she would have to have her PCP schedule the Echo since the visit was pertained more to her not issues with pregnancy. Pt verbalized understanding and stated she would call her PCP.

## 2020-12-09 ENCOUNTER — Ambulatory Visit: Admit: 2020-12-09 | Discharge: 2020-12-09 | Primary: MD

## 2020-12-09 ENCOUNTER — Ambulatory Visit: Admit: 2020-12-09 | Discharge: 2020-12-09 | Attending: MD | Primary: MD

## 2020-12-09 ENCOUNTER — Inpatient Hospital Stay: Admit: 2020-12-09 | Discharge: 2020-12-09 | Disposition: A | Discharge status: Home / Self Care | Attending: MD

## 2020-12-09 DIAGNOSIS — Z3483 Encounter for supervision of other normal pregnancy, third trimester: Secondary | ICD-10-CM

## 2020-12-09 DIAGNOSIS — O26893 Other specified pregnancy related conditions, third trimester: Secondary | ICD-10-CM

## 2020-12-09 LAB — TROPONIN T: Troponin T: <0.01 ng/mL (ref 0.00–0.01)

## 2020-12-09 LAB — CBC WITH AUTO DIFFERENTIAL
Basophils %: 1 % (ref 0–2)
Basophils, Absolute: 0.1 10*3/??L (ref 0.0–0.1)
Eosinophils %: 1 % (ref 0–5)
Eosinophils, Absolute: 0.1 10*3/??L (ref 0.0–0.4)
HCT: 34.6 % — ABNORMAL LOW (ref 35.0–48.0)
Hemoglobin: 11.3 g/dL — ABNORMAL LOW (ref 11.7–16.5)
Lymphocytes %: 19 % — ABNORMAL LOW (ref 20–40)
Lymphocytes, Absolute: 2.2 10*3/??L (ref 1.0–4.0)
MCH: 25.1 pg — ABNORMAL LOW (ref 28.3–33.3)
MCHC: 32.5 g/dL (ref 32.5–36.0)
MCV: 77.1 fL — ABNORMAL LOW (ref 81.0–100.0)
MPV: 7.9 fL (ref 6.9–10.0)
Monocytes %: 8 % (ref 2–12)
Monocytes, Absolute: 0.9 10*3/??L (ref 0.2–1.0)
Neutrophils %: 73 % (ref 50–74)
Neutrophils, Absolute: 8.5 10*3/??L — ABNORMAL HIGH (ref 2.0–7.4)
Platelet Count: 140 10*3/??L — ABNORMAL LOW (ref 150–405)
RBC: 4.49 10*6/??L (ref 3.80–5.60)
RDW: 15.8 % (ref 11.7–16.1)
WBC: 11.6 10*3/??L — ABNORMAL HIGH (ref 4.5–11.0)

## 2020-12-09 LAB — COMPREHENSIVE METABOLIC PANEL
ALT - Alanine Aminotransferase: 8 IU/L (ref 5–33)
AST - Aspartate Aminotransferase: 15 IU/L (ref 5–32)
Albumin/Globulin Ratio: 1.2 (ref 1.2–2.2)
Albumin: 3.5 g/dL (ref 3.5–5.0)
Alkaline Phosphatase: 157 IU/L — ABNORMAL HIGH (ref 35–105)
Anion Gap: 10.4 mmol/L (ref 9.0–18.0)
BUN / Creatinine Ratio: 18.2 (ref 12.0–20.0)
BUN: 8 mg/dL (ref 8–20)
Bilirubin Total: 0.2 mg/dL (ref 0.10–1.70)
CO2 - Carbon Dioxide: 23 mmol/L (ref 17–27)
Calcium: 9.2 mg/dL (ref 8.6–10.6)
Chloride: 100.6 mmol/L — ABNORMAL LOW (ref 101.0–111.0)
Creatinine: 0.44 mg/dL — ABNORMAL LOW (ref 0.60–1.30)
Glomerular Filtration Rate Estimate (Female): >90 mL/min/{1.73_m2} (ref >=60)
Glucose: 90 mg/dL (ref 74–106)
Osmolality Calculation: 266.1 mOsm/kg — ABNORMAL LOW (ref 275.0–300.0)
Potassium: 3.65 mmol/L (ref 3.50–5.10)
Protein Total: 6.5 g/dL (ref 6.4–8.2)
Sodium: 134 mmol/L — ABNORMAL LOW (ref 135–145)

## 2020-12-09 LAB — PRO B-TYPE NATRIURETIC PEPTIDE (SLM): Pro B-Type Natriuretic Peptide: 8 pg/mL (ref 0–450)

## 2020-12-09 LAB — URINALYSIS, MACROSCOPIC (DIP ONLY) (SLM POC)
Bilirubin, Urine: NEGATIVE
Blood, Urine: NEGATIVE
Glucose, Urine: NEGATIVE mg/dL
Ketones, Urine: NEGATIVE mg/dL
Leukocyte Esterase, Urine: NEGATIVE
Nitrite, Urine: NEGATIVE
Protein, Urine: NEGATIVE mg/dL
Specific Gravity, Urine: 1.015 (ref 1.005–1.030)
Urobilinogen, Urine: 0.2 mg/dL (ref 0.0–2.0)
pH, UA: 6 (ref 5.0–7.5)

## 2020-12-09 LAB — TSH: TSH - Thyroid Stimulating Hormone: 8.45 ??IU/mL — ABNORMAL HIGH (ref 0.34–5.60)

## 2020-12-09 LAB — T4, FREE: T4, Free: 0.96 ng/dL (ref 0.58–1.64)

## 2020-12-09 LAB — T3, FREE: T3, Free: 2.7 pg/mL (ref 2.5–3.9)

## 2020-12-09 LAB — CKMB: CKMB: 1.1 ng/mL (ref 0.6–6.3)

## 2020-12-09 LAB — MAGNESIUM: Magnesium: 1.6 mg/dL (ref 1.3–2.5)

## 2020-12-09 MED ORDER — metoprolol tartrate (LOPRESSOR) tablet 25 mg
25 | Freq: Once | ORAL | Status: DC
Start: 2020-12-09 — End: 2020-12-09

## 2020-12-09 MED ORDER — metoprolol tartrate (LOPRESSOR) 25 MG tablet
25 | ORAL_TABLET | Freq: Two times a day (BID) | ORAL | 0 refills | 90.00000 days | Status: DC
Start: 2020-12-09 — End: 2020-12-21

## 2020-12-09 MED ORDER — metoprolol tartrate (LOPRESSOR) tablet 12.5 mg
25 | Freq: Once | ORAL | Status: AC
Start: 2020-12-09 — End: 2020-12-09
  Administered 2020-12-09: 09:00:00 25 mg via ORAL

## 2020-12-09 NOTE — Progress Notes (Signed)
Pt presents for 38wks5days OB visit. Pt denies any bleeding, LOF, contractions, abnormal discharge. Pt states good fetal movement.
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
## 2020-12-09 NOTE — ED Triage Notes (Signed)
Pt presents via POV for CP, tachycardia. Starting at 2100 pt became tachycardic at rest, diaphoretic, and dizzy while laying down, with chest discomfort. Pt was in ED Wednesday with runs of vtach. Pt [redacted] weeks pregnant.

## 2020-12-09 NOTE — ED Notes (Signed)
Discharge instructions reviewed, prescriptions given with instructions. Pt. Ambulatory upon discharge, left via personal vehicle by self. No further questions or concerns upon discharge.

## 2020-12-09 NOTE — ED Notes (Signed)
Bed: 510-01  Expected date:   Expected time:   Means of arrival:   Comments:  J.C.

## 2020-12-10 ENCOUNTER — Ambulatory Visit: Admit: 2020-12-10 | Discharge: 2020-12-11 | Primary: MD

## 2020-12-10 DIAGNOSIS — I493 Ventricular premature depolarization: Secondary | ICD-10-CM

## 2020-12-10 NOTE — Progress Notes (Signed)
Rachel [name] is a 35 year old [phone] at [redacted]w[redacted]d who presents for a return OB visit.  She has good fetal movement.  She denies any abnormal vaginal discharge, bleeding, or leakage of fluid.  She has occasional, mild contractions.  She was seen in the ER last night due to palpitations and chest discomfort from time to time.      O:    Prenatal Vitals     Enc. Date GA Prenatal Vitals Urine Albumin/Glucose Edema Prenatal Cervical Exam Dilation/Effacement/Station    05/19/20 [redacted]w[redacted]d 118/72 / 169 lb 3.2 oz (76.7 kg) Negative / Negative None / None  / 154      06/02/20 [redacted]w[redacted]d 100/68 / 169 lb (76.7 kg) Negative / Negative None / None  / 160      06/08/20 [redacted]w[redacted]d 102/68 / 173 lb (78.5 kg)         06/30/20 [redacted]w[redacted]d 102/68 / 173 lb (78.5 kg) Negative / Negative None / None  / 152      07/28/20 [redacted]w[redacted]d 108/62 / 174 lb 9.6 oz (79.2 kg) Negative / Negative None / None  / 155      08/04/20 [redacted]w[redacted]d 102/66 / 175 lb (79.4 kg)         08/18/20 [redacted]w[redacted]d 118/62 / 179 lb (81.2 kg) Negative / Negative None / None  / 129 / Present      08/27/20 [redacted]w[redacted]d Admission Dept: [facility]    09/21/20 [redacted]w[redacted]d 110/62 / 184 lb 6.4 oz (83.6 kg) Negative / Negative None / None 26 cm / 136 / Present      10/07/20 [redacted]w[redacted]d 110/70 / 186 lb (84.4 kg) Negative / Negative None / None 29 cm / 138 / Present      10/21/20 [redacted]w[redacted]d 116/68 / 188 lb (85.3 kg) Negative / Negative None / None 31 cm / 138 / Present      10/27/20 [redacted]w[redacted]d 114/66 / 188 lb 3.2 oz (85.4 kg)         11/03/20 [redacted]w[redacted]d 130/82 / 189 lb (85.7 kg) Negative / Negative None / None 33 cm / 134 / Present      11/14/20 [redacted]w[redacted]d Admission Dept: [facility]    11/18/20 [redacted]w[redacted]d 110/68 / 191 lb 9.6 oz (86.9 kg) Negative / Negative None / None 35 cm / 132 / Present      11/25/20 [redacted]w[redacted]d 122/68 / 191 lb (86.6 kg) Negative / Negative None / None 36 cm / 135 / Present      12/01/20 [redacted]w[redacted]d 128/76 / 192 lb 6.4 oz (87.3 kg) Negative / Negative None / None 37 cm / 140 / Present Vertex     12/09/20 [redacted]w[redacted]d 130/84 / 192 lb 6.4 oz (87.3 kg) Negative / Negative  None / None 37 cm / 142 / Present  1 / 50 / -3       Number of Fetuses   1    Height   5' 5 (1.651 m)              A/P:  35 year old [id number] at [redacted]w[redacted]d, fetal well being is reassuring.    1. ?IUP -?labor?precautions?and kick counts?were reviewed  ????????????-?cell free DNA testing was normal  ????????????- it's a girl  ????????????- anatomy ultrasound was normal, consistent dates  ????????????- GBS culture was?negative  ????????????-?pt is in nursing school  ????????????- husband is a Engineer, civil (consulting), currently traveling  ????????????- at patient request, she was set up for an IOL on 12/13/20 at 6 am  2.  Palpitations and Occasional Chest Discomfort   - pt was seen in the ER   - while her EKG was normal sinus rhythm at 83 bpm, she did      have frequent premature ventricular contractions.     - was discharged home with prescription for metoprolol   - pt has a Holter Monitor   - I called the [facility] to see if I could move up her IOL to     tomorrow, but due to staffing issues they could not accommodate      the request.  She is set up for IOL on 12/13/20.  ?  3. ?Anti-MTa antibodies  ????????????-?previously detected anti-MTa antibodies were not detected 05/19/20  ??????????????or 10/05/20.  ????????????-?no further workup needed  ?  4. ?History of depression  ????????????- pt stopped taking wellbutrin when she found out she was pregnant  ????????????- pt has good support system

## 2020-12-12 LAB — TISSUE EXAM

## 2020-12-13 ENCOUNTER — Ambulatory Visit: Admit: 2020-12-13 | Primary: MD

## 2020-12-13 ENCOUNTER — Other Ambulatory Visit: Admit: 2020-12-15 | Primary: MD

## 2020-12-13 ENCOUNTER — Other Ambulatory Visit: Admit: 2020-12-14 | Primary: MD

## 2020-12-13 ENCOUNTER — Other Ambulatory Visit: Primary: MD

## 2020-12-13 ENCOUNTER — Inpatient Hospital Stay
Admit: 2020-12-13 | Discharge: 2020-12-15 | Disposition: A | Discharge status: Home / Self Care | Source: Ambulatory Visit | Attending: MD | Admitting: MD

## 2020-12-13 DIAGNOSIS — Z302 Encounter for sterilization: Secondary | ICD-10-CM

## 2020-12-13 DIAGNOSIS — O321XX0 Maternal care for breech presentation, not applicable or unspecified: Secondary | ICD-10-CM

## 2020-12-13 LAB — CBC WITHOUT DIFFERENTIAL
HCT: 33.7 % — ABNORMAL LOW (ref 35.0–48.0)
Hemoglobin: 11 g/dL — ABNORMAL LOW (ref 11.7–16.5)
MCH: 25 pg — ABNORMAL LOW (ref 28.3–33.3)
MCHC: 32.8 g/dL (ref 32.5–36.0)
MCV: 76.3 fL — ABNORMAL LOW (ref 81.0–100.0)
MPV: 8.3 fL (ref 6.9–10.0)
Platelet Count: 124 10*3/??L — ABNORMAL LOW (ref 150–405)
RBC: 4.41 10*6/??L (ref 3.80–5.60)
RDW: 16.2 % — ABNORMAL HIGH (ref 11.7–16.1)
WBC: 9.7 10*3/??L (ref 4.5–11.0)

## 2020-12-13 LAB — VDRL/RPR: VDRL / RPR: NONREACTIVE

## 2020-12-13 LAB — SARS-COV-2 (COVID-19), SCREENING/ASYMPTOMATIC UNEXPOSED: SARS-CoV-2 (COVID-19) by NAAT: NEGATIVE

## 2020-12-13 LAB — ANTIBODY SCREEN (HCLL): Antibody Screen: NEGATIVE

## 2020-12-13 LAB — ABO/RH (HCLL): Rh (D): POSITIVE

## 2020-12-13 MED ORDER — methylergonovine (METHERGINE) injection 200 mcg
0.2 | Freq: Once | INTRAMUSCULAR | Status: DC | PRN
Start: 2020-12-13 — End: 2020-12-15

## 2020-12-13 MED ORDER — fentaNYL (PF) (SUBLIMAZE) 50 mcg/mL injection
50 | INTRAMUSCULAR | Status: AC
Start: 2020-12-13 — End: 2020-12-13

## 2020-12-13 MED ORDER — Lactated Ringers infusion
INTRAVENOUS | Status: DC
Start: 2020-12-13 — End: 2020-12-15
  Administered 2020-12-13 – 2020-12-14 (×5): via INTRAVENOUS
  Administered 2020-12-14

## 2020-12-13 MED ORDER — morphine (PF) (DURAMORPH) 1 mg/mL injection
1 | INTRAMUSCULAR | Status: AC
Start: 2020-12-13 — End: ?

## 2020-12-13 MED ORDER — famotidine (PF) (PEPCID) 20 mg/2 mL injection
20 | INTRAVENOUS | Status: AC
Start: 2020-12-13 — End: 2020-12-13
  Administered 2020-12-13: 23:00:00 20 mg/2 mL

## 2020-12-13 MED ORDER — bupivacaine (PF) (MARCAINE) 0.25 % (2.5 mg/mL) injection
0.25 | INTRAMUSCULAR | Status: DC | PRN
Start: 2020-12-13 — End: 2020-12-13
  Administered 2020-12-13 (×2): 0.25 % (2.5 mg/mL) via EPIDURAL

## 2020-12-13 MED ORDER — metoprolol tartrate (LOPRESSOR) tablet 12.5 mg
25 | Freq: Two times a day (BID) | ORAL | Status: DC
Start: 2020-12-13 — End: 2020-12-13

## 2020-12-13 MED ORDER — fentaNYL with bupivacaine (PF) 2 mcg/mL- 0.1 % epidural
2 | INTRAMUSCULAR | Status: AC
Start: 2020-12-13 — End: 2020-12-13
  Administered 2020-12-13: 22:00:00 2 mcg/mL- 0.1 %
  Administered 2020-12-13: 2-0.1 mcg/mL- 0.1 %

## 2020-12-13 MED ORDER — carboprost (HEMABATE) injection 250 mcg
250 | INTRAMUSCULAR | Status: DC | PRN
Start: 2020-12-13 — End: 2020-12-15

## 2020-12-13 MED ORDER — morphine (PF) (DURAMORPH) 1 mg/mL injection
1 | INTRAMUSCULAR | Status: DC | PRN
Start: 2020-12-13 — End: 2020-12-13
  Administered 2020-12-13: 23:00:00 1 mg/mL via INTRAVENOUS

## 2020-12-13 MED ORDER — ceFAZolin (ANCEF) 2 gram/50 mL in D5W IVPB Premix
2 | INTRAVENOUS | Status: AC
Start: 2020-12-13 — End: 2020-12-13
  Administered 2020-12-13: 250 gram/50 mL
  Administered 2020-12-13: 23:00:00 2 gram/50 mL

## 2020-12-13 MED ORDER — sodium citrate-citric acid (BICITRA) 500-334 mg/5 mL oral solution
500-334 | ORAL | Status: AC
Start: 2020-12-13 — End: 2020-12-13
  Administered 2020-12-13: 23:00:00 500-334 mg/5 mL

## 2020-12-13 MED ORDER — fentaNYL (PF) (SUBLIMAZE) injection
50 | INTRAMUSCULAR | Status: DC | PRN
Start: 2020-12-13 — End: 2020-12-13
  Administered 2020-12-13: 23:00:00 50 mcg/mL via INTRAVENOUS

## 2020-12-13 MED ORDER — methylergonovine (METHERGINE) tablet 200 mcg
0.2 | Freq: Four times a day (QID) | ORAL | Status: AC | PRN
Start: 2020-12-13 — End: 2020-12-14

## 2020-12-13 MED ORDER — lidocaine (PF) (XYLOCAINE) 20 mg/mL (2 %) injection
20 | INTRAMUSCULAR | Status: AC
Start: 2020-12-13 — End: ?

## 2020-12-13 MED ORDER — ePHEDrine sulfate 50 mg/mL injection
50 | INTRAVENOUS | Status: DC
Start: 2020-12-13 — End: 2020-12-13

## 2020-12-13 MED ORDER — fentaNYL (PF) (SUBLIMAZE) 50 mcg/mL injection
50 | INTRAMUSCULAR | Status: AC
Start: 2020-12-13 — End: ?

## 2020-12-13 MED ORDER — oxytocin (PITOCIN) 30 unit/500 mL in 0.9 % NaCl premix infusion
30 | INTRAVENOUS | Status: DC | PRN
Start: 2020-12-13 — End: 2020-12-15

## 2020-12-13 MED ORDER — miSOPROStol (CYTOTEC) tablet 800 mcg
200 | Freq: Once | ORAL | Status: DC | PRN
Start: 2020-12-13 — End: 2020-12-15

## 2020-12-13 MED ORDER — phenylephrine (NEO-SYNEPHRINE) injection
10 | INTRAMUSCULAR | Status: DC | PRN
Start: 2020-12-13 — End: 2020-12-13
  Administered 2020-12-13 – 2020-12-14 (×2): 10 mg/mL via INTRAVENOUS

## 2020-12-13 MED ORDER — oxytocin (PITOCIN) 30 unit/500 mL in 0.9 % NaCl premix infusion
30 | INTRAVENOUS | Status: DC | PRN
Start: 2020-12-13 — End: 2020-12-15
  Administered 2020-12-13: 15:00:00 30 m[IU]/min via INTRAVENOUS
  Administered 2020-12-13 (×5): 30500 m[IU]/min via INTRAVENOUS

## 2020-12-13 MED ORDER — lidocaine 20 mg/mL (2 %) injection
20 | INTRAMUSCULAR | Status: DC | PRN
Start: 2020-12-13 — End: 2020-12-13
  Administered 2020-12-13 (×3): 20 mg/mL (2 %) via INTRAVENOUS

## 2020-12-13 MED ORDER — oxytocin (PITOCIN) injection
10 | INTRAMUSCULAR | Status: DC | PRN
Start: 2020-12-13 — End: 2020-12-13
  Administered 2020-12-13 (×2): 10 unit/mL via INTRAVENOUS

## 2020-12-13 MED ORDER — ceFAZolin (ANCEF) injection
1 | INTRAMUSCULAR | Status: DC | PRN
Start: 2020-12-13 — End: 2020-12-13
  Administered 2020-12-13: 23:00:00 1 gram via INTRAVENOUS

## 2020-12-13 MED ORDER — fentaNYL (PF) (SUBLIMAZE) injection
50 | INTRAMUSCULAR | Status: DC | PRN
Start: 2020-12-13 — End: 2020-12-13
  Administered 2020-12-13 (×2): 50 mcg/mL via INTRAVENOUS

## 2020-12-13 MED FILL — FENTANYL-BUPIVACAINE-NACL (PF) 2 MCG/ML-0.1 % EPIDURAL PREMIX (CNR AVAILABLE): 2 2 mcg/mL- 0.1 % | INTRAMUSCULAR | Qty: 100

## 2020-12-13 NOTE — Progress Notes (Signed)
Labor Progress Note    Subjective    Sakshi rates her discomfort with contractions as mild. Discomfort w/ exam.    Objective    Vitals  BP: 110/65 / P: 84 / T: 36.4 ?C (97.5 ?F) / RR: 16    Fetal Heart Tones: Baseline: 130 bpm, Variability: moderate, Accelerations: present and Decelerations: Absent. Category 1.    Tocometer: Contractions every 1-2 minutes, on 5 mU/min pitocin.    Cervix: 1cm / 30% / -3 at 1230. Making minimal progress.    Assessment/Plan    Jamal Haskin is a 35 y.o. [phone], Rh Positive and GBS 11/18/2020: No Group B Strep 48 hours, at [redacted]w[redacted]d here  for IOL. Pregnancy complicated by?Hx PVC (on metoprolol), Hx ant-MTa Ab (not detected on repeat x2), Hx depression (no longer on Wellbutrin).  ?  #IOL  - Augmentation:?Pitocin  - Membranes: AROM 1230. Clear fluid.  - Pain:?Controlled; Epidural?possibly?desired  - GBS Status:?Negative  - Blood Type:?O+. Will need fetal blood type.  - Maternal well-being reassuring  - Fetal well-being reassuring. Continuous fetal monitoring.  - Continue routine antepartum care  - Anticipate NSVD  ?  #PVC  Bigeminy/trigeminy & PVC noted on tele, consistent w/ prior EKG.  - Continue home metoprolol 12.5mg  BID  - Tele  ?  #Recent abnormal TSH  May re-check post-partum.  ?  Birth plan:  ? She?may?want an epidural  ? They plan to?Breast?feed  ? Expecting a baby?girl - Myleigh. Pediatrician: Marisue Humble  ? Plans for all routine postnatal care, including erythromycin eye ointment, Vitamin K injection. Defer?Hep B vaccination.  ? Postpartum contraception plan:?TBD    Anabel Halon, MD  12/13/2020, 12:32 PM

## 2020-12-13 NOTE — Op Note (Signed)
DATE OF SURGERY: 12/13/2020  PATIENT'S AGE: 35  SURGEON: Nile Dear. Laurence Compton, MD  ASSISTANT:  Anabel Halon, MD.  ANESTHESIOLOGIST:  Idelle Jo, MD.  ANESTHESIA:  Regional.    PREOPERATIVE DIAGNOSES:  A 35 year old G7, P3-0-3-3 at 39 weeks and 2 days  with baby in the breech presentation, undesired fertility.    POSTOPERATIVE DIAGNOSES:  A 35 year old G7, P4, 0-3-4 status post primary   C-section.    OPERATION:  Primary low transverse cesarean section with a double-layer   uterine closure and bilateral tubal ligation.    ESTIMATED BLOOD LOSS:  800 mL    COMPLICATIONS:  None.    SPECIMENS:  Placenta.    PROCEDURE:  The patient was evaluated in her labor room.  Upon pelvic   exam, the presenting part did not feel like the baby's head.  The resident  had previously done an ultrasound and thought the baby was in vertex   presentation.  Due to the exam findings, I did a bedside ultrasound and   the baby was in a complete breech presentation.  As a result, the decision  was made to perform a C-section.  After the surgical consent was signed,   and the risks and benefits of the procedure were reviewed in detail, the   patient was taken to the operating room where a timeout procedure was   done.  After achievement of adequate anesthesia, the patient was prepped   and draped in the normal sterile fashion in the dorsal supine position   with a leftward tilt.  A Pfannenstiel skin incision was made with the   scalpel and carried through to the underlying layer of fascia with the   Bovie.  The fascia was incised in the midline with the scalpel.  The   fascial incision was extended laterally with the Mayo scissors.  The   superior aspect of the fascial incision was grasped with 2 Kocher clamps,   elevated, and the underlying rectus muscle dissected off bluntly and then   sharply with the Mayo scissors.  The inferior aspect of the fascial   incision was grasped with 2 Kocher clamps, elevated, and the underlying   rectus  muscle dissected off bluntly and then sharply with the Mayo   scissors.  The rectus muscle was separated in the midline.  The peritoneum  was entered bluntly.  The peritoneal incision was extended superiorly and   inferiorly.  The bladder blade was inserted, providing good visualization   of the vesicouterine peritoneum which was elevated with the [place] pickup  and entered sharply with the Metzenbaum scissors.  The incision was   extended laterally and the bladder flap was created digitally.  The   bladder blade was reinserted.  A horizontal incision was made in the lower  uterine segment with the scalpel.  Incision was extended laterally with   the bandage scissors.  The baby was noted to be in breech presentation.    The buttocks was delivered through the hysterotomy incision.  The right   leg was delivered in a swooping manner.  The left leg was then delivered   in a swooping manner.  A moist towel was placed around the torso as the   baby was delivered through the hysterotomy incision.  The right arm was   then delivered in a swooping manner followed by the left arm in a similar   manner.  The baby's head was then delivered atraumatically.  The nose and   mouth  were bulb suctioned.  The baby was noted to cry immediately after   delivery.  Delayed umbilical cord clamping was allowed for 1 minute.  The   umbilical cord was then clamped twice and cut and the baby was handed off   to the waiting nursery team.  A cord segment was obtained; cord blood was   obtained.  The placenta was delivered manually.  The uterus was   exteriorized and cleared of all clots and debris.  The hysterotomy   incision was then closed with 0 Vicryl suture in a running and locking   fashion.  A second layer of the same suture was used to obtain excellent   hemostasis.    Attention was then turned to the left fallopian tube, which was grasped in  the mid position with a Babcock clamp and elevated.  A window was made in   the mesosalpinx  using the Bovie.  A 4 cm segment of the mid left fallopian  tube was sutured off with plain gut suture.  The Metzenbaum scissors were   then used to remove the 4 cm segment of fallopian tube.  Excellent   hemostasis was noted.    Attention was then turned to the right fallopian tube, which was elevated   in the mid position with a Babcock clamp.  A window was made in the   mesosalpinx using the Bovie.  A 4 cm segment of the mid right fallopian   tube was sutured off using plain gut suture.  A 4 cm segment of the right   fallopian tube was then removed using the Metzenbaum scissors and sent to   pathology.  Excellent hemostasis was noted.  The uterus was returned to   the pelvis.  The gutters were cleared of all clots and debris.  Pelvis was  irrigated and suctioned out.  The rectus muscle and peritoneum were   reapproximated with 1 chromic suture in a running fashion.  Excellent   hemostasis was noted.  The fascia was reapproximated with 0 Vicryl suture   in a running fashion.  The skin was reapproximated with 4-0 Monocryl   suture in a subcutaneous fashion.  Sponge, needle and instrument counts   were correct x2.  The patient tolerated the procedure well and was taken   to the recovery room in stable condition.    Nile Dear. Laurence Compton, MD  D: 12/15/2020 06:57  T: 12/15/2020 08:43  J/R: [id number]  [facility] / Cherlynn Polo

## 2020-12-13 NOTE — Anesthesia Post-Procedure Evaluation (Signed)
Patient Name: Rachel [name]  Procedures performed: * No procedures listed *    Last Vitals:   Vitals Value Taken Time   BP 90/51 12/13/20 1828   Pulse 75 12/13/20 1828   Resp 16 12/13/20 1828   SpO2 98 % 12/13/20 1828   Temp 36 ?C 12/13/20 1828       Planned Anesthesia Type: general  Final Anesthesia Type: general  Patients Current Location: PACU  Level of Consciousness: responds appropriately and awake  Post Procedure Pain:adequate analgesia  Airway: Patent  Respiratory Status: nasal cannula  Cardio Status: hemodynamically stable  Hydration: adequately hydrated  PONV? NO  no anesthesia complication,               The patient was able to participate in the post op evaluation, but has not recovered from regional anesthetic and is expected to recover within 48 hours    Comments:      Idelle Jo, MD  6:57 PM

## 2020-12-13 NOTE — Progress Notes (Signed)
Labor Progress Note    Subjective    Rachel [name] rates her discomfort with contractions as moderate, she would like to get an epidural.    Objective    Vitals  BP: 110/65 / P: 84 / T: 36.4 ?C (97.5 ?F) / RR: 16    Fetal Heart Tones: Baseline: 120s bpm, Variability: moderate, Accelerations: present and Decelerations: Variable: moderate. Category 2 FHT.      Tocometer: Contractions every 2 minutes, on pitocin.    Cervix: 4 cm / 50% / -2, FSE was placed    Assessment/Plan    Rachel [name] is a 35 y.o. [phone] at [redacted]w[redacted]d undergoing an IOL.     1. Fetal well being is reassuring, s/p FSE placement  2. Continue pitocin  3. Anesthesia was called for an epidural  4. Anticipate SVD.    Conni Elliot, MD  12/13/2020, 2:27 PM

## 2020-12-13 NOTE — Anesthesia Pre-Procedure Evaluation (Addendum)
Anesthesia Evaluation     Patient summary reviewed and Nursing notes reviewed      Airway   Mallampati: I  TM distance: < 6.5 cm  Neck ROM: full  Dental    (+) age appropriate    Pulmonary - normal exam    breath sounds clear to auscultation  (+) asthma,   Cardiovascular   Exercise tolerance: good (4-7 METS)  (+) dysrhythmias,     ECG reviewed  Rhythm: regular  Rate: normal  ROS comment: pvcs frequent    Neuro/Psych    (+) headaches,     GI/Hepatic/Renal    (+) GERD,     Endo/Other - negative ROS    Musculoskeletal - negative ROS                   Anesthesia Plan    ASA 2 - emergent     epidural           Anesthetic plan, risks and benefits discussed with patient.  Consenting person understands and agrees to proceed. PARQ. anesthesia consent form used.

## 2020-12-13 NOTE — H&P (Signed)
Maternal H&P    Subjective    Chief Complaint: IOL    HPI  Rachel [name] is a 35 y.o. [id number] at [redacted]w[redacted]d with Estimated Date of Delivery: 12/18/20 based on LMP. She has been receiving her prenatal care with Dr. Laurence Compton. 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    She presents for IOL.    ROS  ? Positive for fetal movement  ? Negative for contractions, vaginal bleeding, LOF, RUQ pain, vision changes, headache, vaginal discharge, hematuria or dysuria    Obstetrical History  OB History   Gravida Para Term Preterm AB Living   7 3 3   3 3    SAB IAB Ectopic Multiple Live Births   3     0 3      # Outcome Date GA Lbr Len/2nd Weight Sex Delivery Anes PTL Lv   7 Current            6 Term 08/01/19 [redacted]w[redacted]d 05:30 / 00:22 2.75 kg (6 lb 1 oz) F Vag-Spont EPI N LIV   5 Term 07/09/15 [redacted]w[redacted]d  3.104 kg (6 lb 13.5 oz) M Vag-Spont EPI N LIV   4 Term 07/16/06 [redacted]w[redacted]d  3.572 kg (7 lb 14 oz) F Vag-Spont   LIV   3 SAB            2 SAB            1 SAB               Obstetric Comments   #1:  Pupps disease       Gynecological History  No history of STIs.    Family History  No history of birth anomalies, coagulopathies, or fetal demise.    Non-Hospital Problem List as of 12/13/2020       Non-Hospital    Family history of cancer    PCOS (polycystic ovarian syndrome)    Irregular periods/menstrual cycles    LLQ pain    Multigravida    Overview     01/07/2015 [redacted]w[redacted]d  Normal NT ultrasound.  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  Prior child anemic at birth, but not transfused nor did that child have jaundice.   Paternal testing isn't available.  Titers are uncertain to be helpful (very limited cases).  Will follow monthly MCA PSV.  Hildred Priest, MD, PhD   02/02/2015 [redacted]w[redacted]d MCA PSV = 1.08 MoM.  Reassess in 4 weeks.  Hildred Priest, MD, PhD   04/21/2015 [redacted]w[redacted]d Normal growth.  MCA PSV = 1.07MoM.  Reassess in 4 weeks. Hildred Priest, MD, PhD   05/19/2015 [redacted]w[redacted]d Normla growth.  MCA PSV = 0.86MoM.  Reassess MCA PSV in 2 weeks.  Fetal growth in 4 weeks. Hildred Priest, MD, PhD   06/02/2015 [redacted]w[redacted]d MCA PSV = 0.94 MoM.  Reassess MCA PSV and growth in 2 weeks. Hildred Priest, MD, PhD   06/18/2015 [redacted]w[redacted]d Normal growth and MCA.  Given GA, recommend weekly BPPs. Mechele Collin, MD  07/01/2015 [redacted]w[redacted]d BPP = 8/8. MCA PSV =0.98 MoM.  Continue with weekly biophysical profiles. No further assessments of MCA Dopplers necessary.    ####G6 2020-21#####  02/12/2019 [redacted]w[redacted]d Single viable intrauterine pregnancy with biometry consistent  with clinical dates.  Normal nuchal translucency and early anatomy evaluation.  We discussed monitoring strategy similar to her last pregnancy.  Namely, FAS at 20 weeks, then monthly growth and MCA evaluations to start.  Since there has been no meaningful change in her health status the full formal consultation was not repeated. Hildred Priest, MD, PhD     04/09/2019 [redacted]w[redacted]d normal fetal anatomy survey.  Reassessment of fetal growth in 4 weeks and begin MCA PSV screening at that time. Hildred Priest, MD, PhD     05/07/2019  [redacted]w[redacted]d Fetal growth is appropriate. Normal limited fetal anatomy. MCA PSV is 1.05 MoM with is not consistent with emerging fetal anemia.  Reassessment in 4 weeks. Hildred Priest, MD, PhD     06/06/2019 [redacted]w[redacted]d Fetal growth is appropriate. Normal limited fetal anatomy. Normal AFI. MCA PSV 1.23 MoM. No evidence of fetal anemia.Follow up MCA assessment and BPP in 2 weeks recommended. Mechele Collin, MD   07/09/2019 [redacted]w[redacted]d Fetal growth is appropriate. Normal limited fetal anatomy. Normal AFI. Reassuring  BPP. MCA PSV is normal at 1.37 MoM, but the trend is increasing.  As such, I recommend a repeat BPP with MCA Doppler each week.  Furthermore, with isoimmunization, delivery at 37 weeks is recommended.  Hildred Priest, MD, PhD    07/15/2019 [redacted]w[redacted]d BPP 8/8.  Normal AFI. Reassuring fetal status. MCA PSV  = 1.01MoM Which is improved from last week and does not suggest the emergence of fetal anemia.  Ms. Pol is complaining today of worsening abdominal pain overnight.  She is unsure if this is contractions or simply exacerbation of pregnancy discomfort.  I have advised her to be evaluated on labor and delivery to exclude labor. Hildred Priest, MD, PhD   07/22/2019 [redacted]w[redacted]d BPP 8/8.  Normal AFI. Reassuring fetal status. MCA PSV does not reflect emerging fetal anemia.  Delivery at 37 weeks. Hildred Priest, MD, PhD          Genetic screening    Overview     First Trimester Screen:   Down Syndrome 1 in 5063  Trisomy 18/13  1 in > 10,000  Free Beta hCG 70  Papp-A 30  AFP 95  Results to [facility]  Letter to patient           Chest tightness or pressure    [redacted] weeks gestation of pregnancy    Chest pressure    1st degree AV block    MFM VISITS 2022    Overview     06/08/2020 [redacted]w[redacted]d Normal nuchal translucency.  Short interval pregnancy.  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  Titers of uncertain utility.  Paternal testing not available.  Plan to reassess with monthly MCA PSV after the FAS. Weekly testing at 32 weeks. Hildred Priest, MD, PhD   08/04/2020 [redacted]w[redacted]d Normal fetal anatomy.  Normal fetal growth.  Since the antibody screen is now negative, the risk of fetal anemia is essentially zero.  Would recommend that you repeat the assessment of antibody screen at 28 to 30 weeks.  If still negative, then I think she is low risk for complications.  Under those circumstances she could continue on until her due date without extra surveillance unless another complication arises.  She does still have a short interval pregnancy and a growth assessment at 32 weeks is recommended for that indication. Hildred Priest, MD, PhD   10/27/2020 [redacted]w[redacted]d  Normal interval fetal growth in all parameters measured.   Follow  up with MFM as needed.  Hildred Priest, MD, PhD           Genetic screening    Overview     Negative [employer]               Past medical history, surgical history, medication list, and allergies reviewed and updated as appropriate.    Social History  Patient denies tobacco, alcohol, THC, or illicit drug use.     Objective    Vitals:  BP: (!) 122/59 / P: 83 / T: 36.2 ?C (97.2 ?F) / RR:      Physical Examination  General: alert, no acute distress  Skin: warm and dry, no rashes  HEENT: pupils equal and equally reactive to light, mucus membranes moist  Pulm: clear to auscultation bilaterally, no wheezes or crackles  Cardiovascular: regular rate and rhythm, no murmurs  Abdomen: nontender gravid abdomen  Lower Extremities: no edema bilaterally    [facility] Investigations  Bedside Ultrasound: Vertex  Cervix: 1 @ pre-natal appointment last week; Deferred on admission  Fetal Heart Tones: baseline 120, moderate variability, accelerations present, decelerations absent  Tocometer: no contractions, none felt by mom    Prenatal Labs  Blood Type  O, Rh Positive  Antibody  Negative  Last Hct 12/13/2020: 33.7  Last Hgb 12/13/2020: 11.0  Plt   12/13/2020: 124  Rub   16 (Immune)  RPR   Non-Reactive   HBsAg  Nonreactive  HIV   Negative  TSH   8.45  GC   Not Detected  Chlamydia  Not Detected    Cell free DNA testing was normal  1 hr GTT  82 (Negative)    GBS  11/18/2020: No Group B Strep 48 hours  Urine Culture  10/07/2020: Mixed Skin / Genital Flora    Last Ultrasound:  Date: 10/27/20  GA: [redacted]w[redacted]d  Placenta pos:  anterior   EFW, %ile:      2088g, 62%    Labs  Recent Results (from the past 24 hour(s))   CBC Without Differential -Next Routine    Collection Time: 12/13/20  7:17 AM   Result Value Ref Range    WBC 9.7 4.5 - 11.0 10*3/?L    RBC 4.41 3.80 - 5.60 10*6/?L    Hemoglobin 11.0 (L) 11.7 - 16.5 g/dL    HCT 91.4 (L) 78.2 - 48.0 %    MCV 76.3 (L) 81.0 - 100.0 fL    MCH 25.0 (L) 28.3 - 33.3 pg    MCHC 32.8 32.5 - 36.0 g/dL    RDW 95.6 (H) 21.3 - 16.1 %    Platelet Count 124 (L) 150 -  405 10*3/?L    MPV 8.3 6.9 - 10.0 fL   SARS-CoV-2 (COVID-19), Screening/Asymptomatic Unexposed -STAT Nasopharynx Swab    Collection Time: 12/13/20  7:28 AM    Specimen: Nasopharynx; Swab   Result Value Ref Range    SARS-CoV-2 (COVID-19) by NAAT Negative Negative    COVID-19 Resulting Lab       [facility], [address], [place], [place] [address]       Assessment / Plan    Rachel [name] is a 35 y.o. female [phone] here at [redacted]w[redacted]d for IOL. 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    #IOL  - Augmentation: Pitocin. Anticipate AROM.  - Membranes: Intact  - Pain: Controlled; Epidural possibly desired  -  GBS Status: Negative  - Blood Type: O+. Will need fetal blood type.  - Maternal well-being reassuring  - Fetal well-being reassuring. Continuous fetal monitoring.  - Continue routine antepartum care  - Anticipate NSVD    #PVC  - Continue home metoprolol 12.5mg  BID  - Tele    #Recent abnormal TSH  May re-check post-partum.    Birth plan:  ? She may want an epidural  ? They plan to Breast feed  ? Expecting a baby girl.  ? Plans for all routine postnatal care, including erythromycin eye ointment, Vitamin K injection. Defer Hep B vaccination.    Postpartum contraception plan: TBD.    Dr. Laurence Compton notified and agrees with the above plan.    Anabel Halon, MD  12/13/2020, 8:04 AM

## 2020-12-13 NOTE — Anesthesia Post-Procedure Evaluation (Signed)
Patient Name: Rachel [name]  Procedures performed: Procedure(s):  CESAREAN SECTION    Last Vitals:   Vitals Value Taken Time   BP 90/53 12/13/20 1800   Pulse 69 12/13/20 1800   Resp 16 12/13/20 1800   SpO2 98 % 12/13/20 1800   Temp 35.8 ?C 12/13/20 1740       Planned Anesthesia Type: epidural  Final Anesthesia Type: epidural  Patients Current Location: PACU  Level of Consciousness: responds appropriately and awake  Post Procedure Pain:adequate analgesia  Airway: Patent  Respiratory Status: nasal cannula  Cardio Status: hemodynamically stable  Hydration: adequately hydrated  PONV? NO  no anesthesia complication,               The patient was able to participate in the post op evaluation    Comments:      Idelle Jo, MD  6:52 PM

## 2020-12-13 NOTE — Progress Notes (Signed)
Labor Progress Note    Subjective    Rachel [name] rates her discomfort with contractions as mild. Wanting to walk around.    Objective    Vitals  BP: 124/69 / P: 79 / T: 36.2 ?C (97.2 ?F) / RR: 16    Fetal Heart Tones: Baseline: 130 bpm, Variability: moderate, Accelerations: present and Decelerations: Absent. Category 1.    Tocometer: Contractions every 2 minutes, on 24mU/min pitocin.    Cervix: 1cm / 50% / -1 at 1045. Making minimal progress.    Assessment/Plan    Rachel [name] is a 35 y.o. [phone], Rh Positive and GBS 11/18/2020: No Group B Strep 48 hours, at [redacted]w[redacted]d here for IOL. 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  ?  #IOL  - Augmentation: Pitocin. Anticipate AROM.  - Membranes: Intact  - Pain: Controlled; Epidural possibly desired  - GBS Status: Negative  - Blood Type: O+. Will need fetal blood type.  - Maternal well-being reassuring  - Fetal well-being reassuring. Continuous fetal monitoring.  - Continue routine antepartum care  - Anticipate NSVD  ?  #PVC  Bigeminy/trigeminy & PVC noted on tele, consistent w/ prior EKG.  - Continue home metoprolol 12.5mg  BID  - Tele  ?  #Recent abnormal TSH  May re-check post-partum.  ?  Birth plan:  ? She may want an epidural  ? They plan to Breast feed  ? Expecting a baby girl - Rachel [name]. Pediatrician: Marisue Humble  ? Plans for all routine postnatal care, including erythromycin eye ointment, Vitamin K injection. Defer Hep B vaccination.  ? Postpartum contraception plan: TBD  ?   Anabel Halon, MD  12/13/2020, 10:49 AM

## 2020-12-13 NOTE — Anesthesia Pre-Procedure Evaluation (Signed)
Anesthesia Evaluation     Patient summary reviewed and Nursing notes reviewed      Airway   Mallampati: II  TM distance: > 8 cm  Neck ROM: full  Dental    (+) age appropriate    Pulmonary - normal exam    breath sounds clear to auscultation  (+) asthma,   Cardiovascular   Exercise tolerance: good (4-7 METS)  (+) dysrhythmias,     ECG reviewed  Rhythm: regular  Rate: normal    Neuro/Psych    (+) headaches,     GI/Hepatic/Renal    (+) GERD,     Endo/Other - negative ROS    Musculoskeletal - negative ROS                   Anesthesia Plan    ASA 2     general     intravenous induction         Anesthetic plan, risks and benefits discussed with patient.  Consenting person understands and agrees to proceed. anesthesia consent form used. PARQ.

## 2020-12-14 LAB — CBC WITH AUTO DIFFERENTIAL
Basophils %: 0 % (ref 0–2)
Basophils %: 0 % (ref 0–2)
Basophils, Absolute: 0 10*3/??L (ref 0.0–0.1)
Basophils, Absolute: 0 10*3/??L (ref 0.0–0.1)
Eosinophils %: 0 % (ref 0–5)
Eosinophils %: 0 % (ref 0–5)
Eosinophils, Absolute: 0 10*3/??L (ref 0.0–0.4)
Eosinophils, Absolute: 0 10*3/??L (ref 0.0–0.4)
HCT: 34 % — ABNORMAL LOW (ref 35.0–48.0)
HCT: 29 % — ABNORMAL LOW (ref 35.0–48.0)
Hemoglobin: 10.8 g/dL — ABNORMAL LOW (ref 11.7–16.5)
Hemoglobin: 9.4 g/dL — ABNORMAL LOW (ref 11.7–16.5)
Lymphocytes %: 5 % — ABNORMAL LOW (ref 20–40)
Lymphocytes %: 7 % — ABNORMAL LOW (ref 20–40)
Lymphocytes, Absolute: 0.9 10*3/??L — ABNORMAL LOW (ref 1.0–4.0)
Lymphocytes, Absolute: 1.3 10*3/??L (ref 1.0–4.0)
MCH: 24.6 pg — ABNORMAL LOW (ref 28.3–33.3)
MCH: 24.9 pg — ABNORMAL LOW (ref 28.3–33.3)
MCHC: 31.7 g/dL — ABNORMAL LOW (ref 32.5–36.0)
MCHC: 32.4 g/dL — ABNORMAL LOW (ref 32.5–36.0)
MCV: 77.6 fL — ABNORMAL LOW (ref 81.0–100.0)
MCV: 76.8 fL — ABNORMAL LOW (ref 81.0–100.0)
MPV: 8.5 fL (ref 6.9–10.0)
MPV: 8.3 fL (ref 6.9–10.0)
Monocytes %: 4 % (ref 2–12)
Monocytes %: 7 % (ref 2–12)
Monocytes, Absolute: 0.7 10*3/??L (ref 0.2–1.0)
Monocytes, Absolute: 1.2 10*3/??L — ABNORMAL HIGH (ref 0.2–1.0)
Neutrophils %: 91 % — ABNORMAL HIGH (ref 50–74)
Neutrophils %: 86 % — ABNORMAL HIGH (ref 50–74)
Neutrophils, Absolute: 15.2 10*3/??L — ABNORMAL HIGH (ref 2.0–7.4)
Neutrophils, Absolute: 15.4 10*3/??L — ABNORMAL HIGH (ref 2.0–7.4)
Platelet Count: 129 10*3/??L — ABNORMAL LOW (ref 150–405)
Platelet Count: 127 10*3/??L — ABNORMAL LOW (ref 150–405)
RBC: 4.39 10*6/??L (ref 3.80–5.60)
RBC: 3.78 10*6/??L — ABNORMAL LOW (ref 3.80–5.60)
RDW: 16.2 % — ABNORMAL HIGH (ref 11.7–16.1)
RDW: 16.1 % (ref 11.7–16.1)
WBC: 16.8 10*3/??L — ABNORMAL HIGH (ref 4.5–11.0)
WBC: 17.9 10*3/??L — ABNORMAL HIGH (ref 4.5–11.0)

## 2020-12-14 LAB — COMPREHENSIVE METABOLIC PANEL
ALT - Alanine Aminotransferase: 8 IU/L (ref 5–33)
AST - Aspartate Aminotransferase: 27 IU/L (ref 5–32)
Albumin/Globulin Ratio: 1.3 (ref 1.2–2.2)
Albumin: 3.3 g/dL — ABNORMAL LOW (ref 3.5–5.0)
Alkaline Phosphatase: 141 IU/L — ABNORMAL HIGH (ref 35–105)
Anion Gap: 11.6 mmol/L (ref 9.0–18.0)
BUN / Creatinine Ratio: 14.9 (ref 12.0–20.0)
BUN: 7 mg/dL — ABNORMAL LOW (ref 8–20)
Bilirubin Total: 0.4 mg/dL (ref 0.10–1.70)
CO2 - Carbon Dioxide: 22 mmol/L (ref 17–27)
Calcium: 8.4 mg/dL — ABNORMAL LOW (ref 8.6–10.6)
Chloride: 97.4 mmol/L — ABNORMAL LOW (ref 101.0–111.0)
Creatinine: 0.47 mg/dL — ABNORMAL LOW (ref 0.60–1.30)
Glomerular Filtration Rate Estimate (Female): >90 mL/min/{1.73_m2} (ref >=60)
Glucose: 131 mg/dL — ABNORMAL HIGH (ref 74–106)
Osmolality Calculation: 262.4 mOsm/kg — ABNORMAL LOW (ref 275.0–300.0)
Potassium: 3.71 mmol/L (ref 3.50–5.10)
Protein Total: 5.9 g/dL — ABNORMAL LOW (ref 6.4–8.2)
Sodium: 131 mmol/L — ABNORMAL LOW (ref 135–145)

## 2020-12-14 LAB — MAGNESIUM: Magnesium: 1.6 mg/dL (ref 1.3–2.5)

## 2020-12-14 LAB — T4, FREE: T4, Free: 0.88 ng/dL (ref 0.58–1.64)

## 2020-12-14 LAB — T3, FREE: T3, Free: 2.3 pg/mL — ABNORMAL LOW (ref 2.5–3.9)

## 2020-12-14 LAB — TSH: TSH - Thyroid Stimulating Hormone: 2.58 ??IU/mL (ref 0.34–5.60)

## 2020-12-14 MED ORDER — HYDROcodone-acetaminophen (NORCO) 5-325 mg per tablet 1 tablet
5-325 | ORAL | Status: DC | PRN
Start: 2020-12-14 — End: 2020-12-14
  Administered 2020-12-14: 08:00:00 5-325 via ORAL

## 2020-12-14 MED ORDER — ondansetron (ZOFRAN) injection 4 mg
4 | Freq: Once | INTRAMUSCULAR | Status: DC | PRN
Start: 2020-12-14 — End: 2020-12-15

## 2020-12-14 MED ORDER — naloxone (NARCAN) injection 0.04 mg
0.4 | INTRAMUSCULAR | Status: DC | PRN
Start: 2020-12-14 — End: 2020-12-15

## 2020-12-14 MED ORDER — ketorolac (TORADOL) 15 mg/mL injection
15 | INTRAMUSCULAR | Status: DC | PRN
Start: 2020-12-14 — End: 2020-12-13
  Administered 2020-12-14: 15 mg/mL via INTRAVENOUS

## 2020-12-14 MED ORDER — glycerin-witch hazeL (TUCKS) pad 1 pad
12.5-50 | TOPICAL | Status: DC | PRN
Start: 2020-12-14 — End: 2020-12-15

## 2020-12-14 MED ORDER — naloxone (NARCAN) 4 mg in NaCl 0.9 % 500 mL (8 mcg/mL) infusion
1 | INTRAMUSCULAR | Status: DC | PRN
Start: 2020-12-14 — End: 2020-12-15

## 2020-12-14 MED ORDER — bisacodyl (DULCOLAX) EC tablet 5-10 mg
5 | Freq: Every day | ORAL | Status: DC | PRN
Start: 2020-12-14 — End: 2020-12-15

## 2020-12-14 MED ORDER — meperidine (PF) (DEMEROL) injection 12.5 mg
25 | INTRAMUSCULAR | Status: DC | PRN
Start: 2020-12-14 — End: 2020-12-15

## 2020-12-14 MED ORDER — oxytocin (PITOCIN) 30 unit/500 mL in 0.9 % NaCl premix infusion
30 | INTRAVENOUS | Status: AC
Start: 2020-12-14 — End: 2020-12-14

## 2020-12-14 MED ORDER — glycopyrrolate (ROBINUL) syringe 0.2 mg
1 | INTRAVENOUS | Status: DC | PRN
Start: 2020-12-14 — End: 2020-12-15

## 2020-12-14 MED ORDER — ketorolac (TORADOL) 15 mg/mL injection 15 mg
15 | Freq: Four times a day (QID) | INTRAMUSCULAR | Status: AC
Start: 2020-12-14 — End: 2020-12-14
  Administered 2020-12-14 (×3): 15 mg via INTRAVENOUS

## 2020-12-14 MED ORDER — dexAMETHasone (PF) (DECADRON) 10 mg/mL injection
10 | INTRAMUSCULAR | Status: AC
Start: 2020-12-14 — End: ?

## 2020-12-14 MED ORDER — docusate sodium (COLACE) capsule 100 mg
100 | Freq: Two times a day (BID) | ORAL | Status: DC
Start: 2020-12-14 — End: 2020-12-15
  Administered 2020-12-14 – 2020-12-15 (×4): 100 mg via ORAL

## 2020-12-14 MED ORDER — polyethylene glycol (MIRALAX) packet 17 g
17 | Freq: Every day | ORAL | Status: DC | PRN
Start: 2020-12-14 — End: 2020-12-15
  Administered 2020-12-15 (×2): 17 g via ORAL

## 2020-12-14 MED ORDER — naloxone (NARCAN) syringe 0.1 mg
1 | INTRAMUSCULAR | Status: DC | PRN
Start: 2020-12-14 — End: 2020-12-15

## 2020-12-14 MED ORDER — ondansetron (ZOFRAN) injection 4 mg
4 | Freq: Four times a day (QID) | INTRAMUSCULAR | Status: DC | PRN
Start: 2020-12-14 — End: 2020-12-15

## 2020-12-14 MED ORDER — oxyCODONE-acetaminophen (PERCOCET) 5-325 mg per tablet 1-2 tablet
5-325 | ORAL | Status: DC | PRN
Start: 2020-12-14 — End: 2020-12-15
  Administered 2020-12-14 – 2020-12-15 (×7): 5-325 via ORAL

## 2020-12-14 MED ORDER — fentaNYL (PF) (SUBLIMAZE) injection 25-50 mcg
50 | INTRAMUSCULAR | Status: DC | PRN
Start: 2020-12-14 — End: 2020-12-15

## 2020-12-14 MED ORDER — ondansetron (ZOFRAN) injection
4 | INTRAMUSCULAR | Status: DC | PRN
Start: 2020-12-14 — End: 2020-12-13
  Administered 2020-12-14: 4 mg/2 mL via INTRAVENOUS

## 2020-12-14 MED ORDER — ibuprofen (ADVIL) tablet 800 mg
800 | Freq: Three times a day (TID) | ORAL | Status: DC
Start: 2020-12-14 — End: 2020-12-15
  Administered 2020-12-15 (×2): 800 mg via ORAL

## 2020-12-14 MED ORDER — dexAMETHasone (DECADRON) injection
10 | INTRAMUSCULAR | Status: DC | PRN
Start: 2020-12-14 — End: 2020-12-13
  Administered 2020-12-14: 10 mg/mL via INTRAVENOUS

## 2020-12-14 MED ORDER — simethicone (MYLICON) chewable tablet 80 mg
80 | Freq: Four times a day (QID) | ORAL | Status: DC | PRN
Start: 2020-12-14 — End: 2020-12-15
  Administered 2020-12-14: 20:00:00 80 mg via ORAL

## 2020-12-14 MED ORDER — glycopyrrolate (PF) in water (ROBINUL) 0.4 mg/2 mL (0.2 mg/mL) syringe
0.4 | INTRAVENOUS | Status: AC
Start: 2020-12-14 — End: ?

## 2020-12-14 MED ORDER — metoprolol tartrate (LOPRESSOR) tablet 12.5 mg
25 | Freq: Two times a day (BID) | ORAL | Status: DC
Start: 2020-12-14 — End: 2020-12-15
  Administered 2020-12-14 – 2020-12-15 (×4): 25 mg via ORAL

## 2020-12-14 MED ORDER — labetaloL (NORMODYNE) injection 5 mg
5 | INTRAVENOUS | Status: DC | PRN
Start: 2020-12-14 — End: 2020-12-15

## 2020-12-14 MED ORDER — oxytocin (PITOCIN) 10 unit/mL injection
10 | INTRAMUSCULAR | Status: AC
Start: 2020-12-14 — End: ?

## 2020-12-14 MED ORDER — ondansetron (ZOFRAN) 4 mg/2 mL injection
4 | INTRAMUSCULAR | Status: AC
Start: 2020-12-14 — End: ?

## 2020-12-14 MED ORDER — ketorolac (TORADOL) 15 mg/mL injection
15 | INTRAMUSCULAR | Status: AC
Start: 2020-12-14 — End: ?

## 2020-12-14 MED ORDER — phenylephrine HCl in 0.9% NaCl (NEO-SYNEPHRINE) 1 mg/10 mL (100 mcg/mL) injection syringe
1 | INTRAVENOUS | Status: AC
Start: 2020-12-14 — End: ?

## 2020-12-14 MED ORDER — modified lanolin cream
100 | TOPICAL | Status: DC | PRN
Start: 2020-12-14 — End: 2020-12-15
  Administered 2020-12-14 (×2): 100 % via TOPICAL

## 2020-12-14 MED ORDER — glycopyrrolate (PF) in water (ROBINUL) 1 mg/5 mL (0.2 mg/mL) syringe
1 | INTRAVENOUS | Status: DC | PRN
Start: 2020-12-14 — End: 2020-12-13
  Administered 2020-12-13: 23:00:00 1 mg/5 mL (0.2 mg/mL) via INTRAVENOUS

## 2020-12-14 MED ORDER — naloxone (NARCAN) injection 0.4 mg
0.4 | INTRAMUSCULAR | Status: DC | PRN
Start: 2020-12-14 — End: 2020-12-15

## 2020-12-14 MED ORDER — diphenhydrAMINE (BENADRYL) injection 12.5-25 mg
50 | INTRAMUSCULAR | Status: DC | PRN
Start: 2020-12-14 — End: 2020-12-15

## 2020-12-14 MED ORDER — benzocaine-menthol-lanolin-aloe (DERMOPLAST) 20-0.5 % topical spray
20-0.5 | Freq: Four times a day (QID) | TOPICAL | Status: DC | PRN
Start: 2020-12-14 — End: 2020-12-15

## 2020-12-14 NOTE — Plan of Care (Signed)
Discussed day shift cesarean section postpartum care, including vital signs, assessments, fundal checks, medications, ambulation and peri care.  Discussed principles of breastfeeding.  Patient demonstrates positive interaction with newborn, attentive to newborn needs. Continue to educate as needed.        Problem: Pain  Goal: Patient will achieve activity goals and show improvement in function, mood, and coping  Outcome: Progressing  Goal: Pain level within patient's desired comfort-function goal  Outcome: Progressing  Goal: Patient will verbalize understanding and be an active participant in the pain management care plan  Outcome: Progressing  Goal: Patient will be able to identify strategies to reduce anxiety and improve coping  Outcome: Progressing     Problem: Knowledge Deficit  Goal: Patient/family/caregiver demonstrates understanding of disease process, treatment plan, medications, and discharge instructions  Description: Complete learning assessment and assess knowledge base.  Outcome: Progressing     Problem: C-Section Postpartum Care  Goal: Patient vital signs are stable  Outcome: Progressing  Goal: Dressing intact until removed with any drainage marked  Outcome: Progressing  Goal: Fundus firm at midline  Outcome: Progressing  Goal: Moderate rubra without clots, no purulent discharge, no foul smelling lochia  Outcome: Progressing  Goal: Urine output is 30 mL/hour or more  Description: Urinary catheter is draining yellow urine 30 mL/hour or more.  Outcome: Progressing  Goal: Patient is able to void/empty bladder after catheter is removed  Description: Assess bladder and bladder function.  Outcome: Progressing     Problem: Breast Feeding  Goal: Breasts are soft with nipple integrity intact  Outcome: Progressing  Goal: Effective breast feeding established  Description: Mother is able to demonstrate proper infant positioning and alignment to promote adequate latching and breastfeeding.   Outcome: Progressing      Problem: Parenting/Coping  Goal: Patient/family/caregiver demonstrates positive interactions and parenting skills with baby  Description: Parent/caregiver provides routine infant care, is engaged and aware of infant cues/behaviors and stimulates cognitive, emotional and social development. Parent/caregiver empathizes and interacts positively with child and demonstrates positive self esteem.   Outcome: Progressing     Problem: Infection  Goal: Signs and symptoms of infections are decreased or avoided  Description: Assess and monitor patient for signs and symptoms of infection such as redness, warmth, discharge, and increased body temperature. Monitor and report abnormal lab values (ex-CBC and diff, serum protein, serum albumin, and cultures).  Wash hands properly before and after each patient care activity. Utilize standard precautions and use personal protective equipment (PPE) as indicated. Ensure aseptic care of all intravenous lines and invasive tubes/drains. Obtain immunization and exposure to communicable diseases history. Collaborate with interdisciplinary team and initiate plan and interventions as ordered.  Outcome: Progressing

## 2020-12-14 NOTE — Lactation Note (Signed)
This note was copied from a baby's chart.  Inpatient Lactation Consultation    Date: 12/14/2020   Patient:  Rachel [name]  [name], Rachel [name]    Birth Weight: 3.147 kg (6 lb 15 oz)  Today's Weight:        Goal: Mom wants to breast feed.     Mom Breastfeeding Experience: This is mom's fourth baby and desires to breast feed this time.     Breast Growth During Pregnancy: Mom reports that her breasts increased at least one cup size during her pregnancy. Copious amounts of colostrum easily expressed from the L breast by mom and LC.     Breastfeeding Assessment: Assisted with a feed at this time. Mom was attempting to latch infant in the R football but infant was too sleepy and uninterested. Mom reports she fed all night long. Attempted waking techniques but infant remained too sleepy. Disc hand expression and spoon feeding and mom agreed. LC demonstrated and mom was able to return demonstrate. Infant was fed an unmeasured amount of hand expressed colostrum but LC feels it was a successful feed and infant is appearing content and falling asleep. Mom reports multiple stool diapers and that she could hear infant swallowing with feeds last night. Education provided at this time, and mom has multiple pump options at home. Parents denied any further needs at this time.     Percent Weight Loss: no reweigh, infant is voiding and stooling.     Plan: Feed on demand but at least Q 2-3 hours, hand express and spoon feed if infant too sleepy to latch and feed. Call lactation PRN.       Patient Education:    Mother was instructed in: Apply EBM and/or lanolin to sore or damaged nipples, Pump use: frequency, duration, rationale, Hand expression: technique, frequency, duration, rationale, Safe storage and handling of EBM, Hunger cues, Cluster feeding, Tracking I/O on sheet, Expected newborn weight gain/loss, Normal feeding patterns., Colostrum easily expressed bilaterally., Infant Nutrition Program information for ongoing  support following discharge., Infant feeding positions., Feed every 2-3 hours for 15-20 min each side with free access to the breasts., Skin to skin., Breast size and nipple shape., Counting wet and dirty diapers., Methods to wake a sleeping baby., Signs of an effective latch and Nutritive Sucking vs. Non-Nutritive Sucking    Written information given on: Lactation Consultant Business Card and Post Partum and Newborn Care Book.        Wilber Bihari   Infant Nutrition Program

## 2020-12-14 NOTE — Progress Notes (Signed)
Cesarean Postpartum Progress Note    Subjective    Rachel [name] is a 34 y.o., now [id number] who is 1 day  postop from PLTCS at [redacted]w[redacted]d.     Overnight: No acute events.    This AM: No acute concerns. Breast and bottle well. She is wondering if ok to shower with incision once home. Norco helping take edge off of pain.     Post-op Checklist  . Pain: well controlled with ibuprofen and Norco.  . Lochia: less than a normal period for her; no clots larger than a quarter.  . Incision concerns: none  . Appetite: improving  . Foley: in place; Spontaneous void not yet  . Ambulation: walking x1 w/ assistance w/o light headedness  . Flatus: yes; Bowel movement: no    Objective    Vitals  BP: (!) 98/55 / P: 69 / T: 36.1 ?C (97 ?F) / RR: 16  I/O last 3 completed shifts:  In: 1000 [I.V.:1000]  Out: 950 [Urine:250; Blood:700]    Physical Exam  General: alert, no acute distress  Skin: warm and dry, no rashes appreciated  HEENT: mucus membranes moist  Pulm: clear to auscultation bilaterally, no wheezes or crackles  Cardiovascular: regular rate and rhythm, no murmurs  Abdomen: bowel sounds normal, appropriately tender to palpation over uterus, fundus at 1cm below  Incision: dressing intact w/o bleed through. Site is clean, dry, intact without purulence or erythema  Lower Extremities: trace edema bilaterally    Labs  Results from last 7 days   Lab Units 12/13/20  2054 12/13/20  0717 12/09/20  0047   WBC 10*3/?L 16.8* 9.7 11.6*   HGB g/dL 56.2* 13.0* 86.5*   MCV fL 77.6* 76.3* 77.1*   NEUTROS PCT % 91*  --  73   PLATELETS 10*3/?L 129* 124* 140*     Results from last 7 days   Lab Units 12/13/20  2055 12/09/20  0047   AST IU/L 27 15   ALT IU/L 8 8   ALK PHOS IU/L 141* 157*   PROTEIN TOTAL g/dL 5.9* 6.5   ALBUMIN g/dL 3.3* 3.5   A/G RATIO  1.3 1.2       Assessment/Plan    Rachel [name] is a 35 y.o. now [id number] who is 1 day  postpartum s/p Procedure(s):  CESAREAN SECTION at [redacted]w[redacted]d, doing well postoperatively.     1. Routine Post-Cesearean Care.  Continue lactation support, incision and nipple care, current pain control, bowel regimen.S/p tubal ligation for contraception.        - AM CBC  2. PVC: Bigeminy/trigeminy & PVC noted on tele, consistent w/ prior EKG.        - Continue home metoprolol 12.5mg  BID  3. Recent abnormal TSH: Thyroid hormone labs wnl post-partum. May re-check post-partum    Anticipate discharge 10/5 (C-section).    Anabel Halon, MD  12/14/2020, 5:22 AM

## 2020-12-14 NOTE — Progress Notes (Signed)
Pod #1 did well through night pain control with ketorolac with lower doseage of ms04 she requested    Rmorton,MD

## 2020-12-15 LAB — PLACENTA DISCARD

## 2020-12-15 MED ORDER — ibuprofen (ADVIL) 800 MG tablet
800 | ORAL_TABLET | Freq: Three times a day (TID) | ORAL | 1 refills | Status: AC
Start: 2020-12-15 — End: 2020-12-25

## 2020-12-15 MED ORDER — docusate sodium (COLACE) 100 MG capsule
100 | ORAL_CAPSULE | Freq: Two times a day (BID) | ORAL | 0.00 refills | 30.00000 days | Status: DC
Start: 2020-12-15 — End: 2021-06-29

## 2020-12-15 MED ORDER — oxyCODONE-acetaminophen (PERCOCET) 5-325 mg per tablet
5-325 | ORAL_TABLET | ORAL | 0 refills | 28.00000 days | Status: AC | PRN
Start: 2020-12-15 — End: 2020-12-22

## 2020-12-15 MED ORDER — oxyCODONE (ROXICODONE) tablet 5 mg
5 | ORAL | Status: DC | PRN
Start: 2020-12-15 — End: 2020-12-15
  Administered 2020-12-15 (×2): 5 mg via ORAL

## 2020-12-15 NOTE — Progress Notes (Signed)
EKG Complete handed to RN [employer]

## 2020-12-15 NOTE — Lactation Note (Signed)
This note was copied from a baby's chart.  Inpatient Lactation Consultation    Date: 12/15/2020   Patient:  Rachel [name]  [name], Rachel [name]    Birth Weight: 3.147 kg (6 lb 15 oz)  Today's Weight: 2.94 kg (6 lb 7.7 oz)    Breastfeeding Assessment: Rounded on couplet. Mom reports infant fed well last night and continues to feed well this morning. Mom states infant is latching well and she has no concerns. Encouraged mom that she is feeding her infant well. Disc ways to reach lactation even when discharged. Mom states understanding.     Percent Weight Loss: -7%, voiding and stooling.     Plan: Continue to feed on demand but at least Q 2-3 hours. Call lactation PRN.       Patient Education:    Mother was instructed in: Normal feeding patterns., Infant Nutrition Program information for ongoing support following discharge., Feed every 2-3 hours for 15-20 min each side with free access to the breasts. and Counting wet and dirty diapers.      Wilber Bihari   Infant Nutrition Program

## 2020-12-15 NOTE — Progress Notes (Signed)
VAGINAL DELIVERY ROUNDING NOTE  Maternal Child Health    Pt. Name/Age/DOB: Rachel [name]     35 y.o.   Jun 22, 1985       Medical Record Number:   [id number]  CSN: [id number]  Date of admission:  12/13/2020  Primary Care Physician:  Veleta Miners  Admitting Physician:  Conni Elliot MD   Room: 2807/2807-01    Subjective     Summary Statement:  Rachel [name] is a 35 y.o., now [id number] on  Hospital day Hospital Day: 3 and post-op day 2 Days Post-Op from cesarean section at [redacted]w[redacted]d for breech with decels.     This Morning:  She is recovering well. Her pain is well controlled with percocet, roxycodone. She denies chest pain, shortness of breath, and calf pain. She describes her lochia as moderate, about the same as a normal period for her. No clots larger than a quarter. Her incision is intact and causing the patient no problems. She is tolerating a regular diet and ambulating well. She has passed flatus, and she has had a bowel movement. She has no concerns at this time. She is breast feeding, and baby is latching well. Feeding every 2-3hrs.    Objective     Vitals:   BP: (96-100)/(49-53) 100/49  Pulse:  [65-75] 68  Resp:  [16] 16  SpO2:  [97 %-100 %] 97 %  Temp:  [36.3 ?C (97.3 ?F)-36.6 ?C (97.9 ?F)] 36.6 ?C (97.9 ?F)    Intake/Output Summary (Last 24 hours) at 12/15/2020 0704  Last data filed at 12/14/2020 1515  Gross per 24 hour   Intake --   Output 1625 ml   Net -1625 ml       Physical Examination:  General: alert, appears stated age and cooperative  Skin: normal  Lungs: clear to auscultation bilaterally  Heart: RRR  Abdomen: soft, appropriate tenderness; bowel sounds normal; no masses, no organomegaly  Fundus: firm  Incision: dressing intact  Extremities: no calf pain, trace edema    Inpatient Labs:  No results found for this or any previous visit (from the past 24 hour(s)).    Assessment / Plan     Deniz Eskridge Ortego?is a 34 y.o.?female?[id number] here at [redacted]w[redacted]d for IOL. Pregnancy complicated by?Hx PVC (on metoprolol),  Hx ant-MTa Ab (not detected on repeat x2), Hx depression (no longer on Wellbutrin).  ?  #PVC  - Continue home metoprolol 12.5mg  BID  - Tele  ?  #Recent abnormal TSH  May re-check post-partum.  ?  Diagnoses  ? Term pregnancy at [redacted]w[redacted]d s/p PLTCS      Patient Active Problem List   Diagnosis SNOMED CT(R)   . Family history of cancer FAMILY HISTORY OF CANCER   . PCOS (polycystic ovarian syndrome) POLYCYSTIC OVARY SYNDROME   . Irregular periods/menstrual cycles IRREGULAR PERIODS   . LLQ pain LEFT LOWER QUADRANT PAIN   . Multigravida MULTIGRAVIDA   . 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 Genetic screening PATIENT ENCOUNTER STATUS   . Chest tightness or pressure CHEST DISCOMFORT   . [redacted] weeks gestation of pregnancy GESTATION PERIOD, 34 WEEKS   . Chest pressure CHEST DISCOMFORT   . 1st degree AV block FIRST DEGREE ATRIOVENTRICULAR BLOCK   . MFM VISITS 2022 PREGNANCY   . Genetic screening PATIENT ENCOUNTER STATUS       1. Recovering well  2. Continue lactation support  3. Anticipate discharge today    Owens Loffler,  MD  12/15/2020, 7:04 AM

## 2020-12-15 NOTE — Discharge Summary (Signed)
Discharge Summary - Cesarean Delivery Postpartum    Admit date: 12/13/2020  5:39 AM  Discharge date: 12/15/2020, 2 days  s/p PLTCS  Discharge attending: No att. providers found    Subjective    Labor course notable for C-section due to breech with decels. Postpartum course- needed percocet instead of norco. Her vital signs have been stable and afebrile. Patient is tolerating a regular diet and was ambulating and voiding well. She has been passing flatus. She has had a BM. Breast feeding well.     Maternal  Name: Rachel [name]  Age: 35 y.o.  G&P s/p delivery: [id number]  GBS Status: No Group B Strep 48 hours  Blood Type: O Positive  Rubella 16 (Immune)  RPR Non-Reactive  GC Not Detected  Chlamydia Not Detected    Pregnancy Details  Complications: Hx PVC (on metoprolol), Hx ant-MTa Ab (not detected on repeat x2), Hx depression (no longer on Wellbutrin).  Social concerns: None    Providers  Delivering Physician: Conni Elliot   Maternal PCP: Veleta Miners, MD  Newborn PCP: Marisue Humble Newborn  Name: Rachel [name]  Gender: female   DOB: 12/13/2018  at 4:40 PM   Gestational Age: [redacted]w[redacted]d  Birth Weight: 3.147 kg (6 lb 15 oz)   Birth Length: 48.3 cm (1' 7) (Filed from Delivery Summary)   Apgars (1 and 5 minutes): 9 , 9   Blood Type: A  Positive   Direct Coombs: Positive     Delivery Details  Delivery Indication:?Breech?with?decels  Delivery Method:?C-Section, Low Transverse?  Anesthesia:??Epidural, Spinal  Complications:???Nuchal x1  Resuscitation:???Warm & stim  Maternal Antibiotics:?2 grams Ancef (C-section)     Review of Systems  ? Positive for minimal and improving lochia  ? Very minimal incisional bleeding and purulence, headache, vision changes, chest pain, shortness of breath, RUQ pain    Objective    Vitals  BP: (!) 105/54 / P: 74 / T: 36.6 ?C (97.9 ?F) / RR: 16    Physical Exam (Performed by Dr. Milagros Reap on day of discharge)  General: alert, no acute distress  Skin: warm and dry, no rashes appreciated  HEENT: mucus  membranes moist  Pulm: clear to auscultation bilaterally, no wheezes or crackles  Cardiovascular: regular rate and rhythm, no murmur  Abdomen: bowel sounds normal, appropriately tender to palpation over uterus, fundus right below umbilicus, incision clean/dry/intact- some red discharge, well approximated, well-healing  Lower Extremities: Trace edema bilaterally    Labs  Results from last 7 days   Lab Units 12/14/20  0556 12/13/20  2054 12/13/20  0717 12/09/20  0047   WBC 10*3/?L 17.9* 16.8* 9.7 11.6*   HGB g/dL 9.4* 45.4* 09.8* 11.9*   MCV fL 76.8* 77.6* 76.3* 77.1*   NEUTROS PCT % 86* 91*  --  73   PLATELETS 10*3/?L 127* 129* 124* 140*     Results from last 7 days   Lab Units 12/13/20  2055 12/09/20  0047   AST IU/L 27 15   ALT IU/L 8 8   ALK PHOS IU/L 141* 157*   PROTEIN TOTAL g/dL 5.9* 6.5   ALBUMIN g/dL 3.3* 3.5   A/G RATIO  1.3 1.2     EKG: Sinus rhythm, intermittent ventricular bigeminy    Assessment/Plan    [name] is a 36 y.o. female [phone] here at [redacted]w[redacted]d for IOL. 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  ?  #PVC  #Ventricular  bigeminy  Patient asymptomatic other than occasional fluttering in chest. EKG prior to discharge with occasional ventricular bigeminy without other abnormalities on EKG or telemetry.   - Continue home metoprolol 12.5mg  BID  - Patient encouraged to f/u with outpatient PCP at next available.  ?  #Recent abnormal TSH  TSH normal on admission prior to delivery.    Diagnoses  ? Term pregnancy at [redacted]w[redacted]d s/p PLTCS   ? Ventricular bigeminy and PVCs on metoprolol    Discharge Disposition: Home with infant on Hospital Day: 3 at 2 days  s/p cesarean delivery.    Discharge Status: Well, stable    Discharge Meds     Patient's Discharge Medication List      TAKE these medications      Indications of Use   docusate sodium 100 MG capsule  Quantity: 30 capsule  Commonly known as: COLACE  Take 1  capsule by mouth 2 times daily.      ibuprofen 800 MG tablet  Quantity: 30 tablet  Commonly known as: ADVIL  Take 1 tablet by mouth every 8 hours for 10 days.      metoprolol tartrate 25 MG tablet  Quantity: 10 tablet  Commonly known as: LOPRESSOR  Take 0.5 tablets by mouth 2 times daily for 20 doses.      omeprazole DR 20 MG capsule  Quantity: 30 capsule  Commonly known as: PriLOSEC  Take 1 capsule by mouth daily.      oxyCODONE-acetaminophen 5-325 mg per tablet  Quantity: 30 tablet  Commonly known as: PERCOCET  Take 1-2 tablets by mouth every 4 hours as needed for up to 7 days.      prenatal vit-iron fum-folic ac 27 mg iron- 0.8 mg Tab tablet  Take 1 tablet by mouth daily.         STOP taking these medications    ASCORBIC ACID-COLLAGEN ORAL     buPROPion 75 MG tablet  Commonly known as: WELLBUTRIN     CUSTOM MED     herbal drugs Cap     metFORMIN 500 MG tablet  Commonly known as: GLUCOPHAGE     MULTIVITAMIN ORAL     norethindrone-ethinyl estradiol 1 mg-20 mcg (21)/75 mg (7) per tablet  Commonly known as: JUNEL FE 1/20            Follow-up  ? Follow up with PCP in 1 week for PVCs and ventricular bigeminy  ? Follow up with Dr. Laurence Compton in 2 weeks for incision check, and 6 weeks for postpartum visit.  ? Contraception plans: TBD    Patient was given written postoperative instructions in regards to her activity level and limitations.    Sammuel Cooper, MD  12/15/2020, 5:27 PM

## 2020-12-16 ENCOUNTER — Encounter: Attending: MD | Primary: MD

## 2020-12-20 NOTE — Telephone Encounter (Signed)
Lactation Service Telephone Consult    12/20/2020     Date of Birth: 12/13/2018    Feeding method (breast/bottle): breast    Feeding amount/frequency: Q 1.5-2 hours, latching well per mom. One breast for 20 mins. Mom reports infant falls asleep and sucks non nutritively. Disc waking techniques.     Voiding and Stooling: Good output, stooling a lot with a rash. Disc diaper rash remedies.     Breast health/milk status: milk is in.     Follow up appointment: Weight check today. Mom reports sore nipples, disc that being normal as long as no damage and she denied nipple trauma. Disc using lanolin/EBM to help soothe. Mom denied any further needs at this time.     Plan: contact Infant Nutrition Program staff as needed for follow-up.    Hector Venne

## 2020-12-21 MED ORDER — metoprolol tartrate (LOPRESSOR) 25 MG tablet
25 | ORAL_TABLET | Freq: Two times a day (BID) | ORAL | 0 refills | 90.00000 days | Status: DC
Start: 2020-12-21 — End: 2020-12-22

## 2020-12-22 ENCOUNTER — Ambulatory Visit: Admit: 2020-12-22 | Discharge: 2020-12-22 | Attending: MD | Primary: MD

## 2020-12-22 DIAGNOSIS — Z09 Encounter for follow-up examination after completed treatment for conditions other than malignant neoplasm: Secondary | ICD-10-CM

## 2020-12-22 MED ORDER — metoprolol tartrate (LOPRESSOR) 25 MG tablet
25 | ORAL_TABLET | Freq: Two times a day (BID) | ORAL | 0 refills | 90.00000 days | Status: DC
Start: 2020-12-22 — End: 2020-12-28

## 2020-12-22 NOTE — Progress Notes (Unsigned)
RX was sent in this morning by Dr.Barton for #20 tabs only. Patient is requesting a full month supply be sent in by PCP.     Rachel [name] [phone]

## 2020-12-22 NOTE — Progress Notes (Signed)
Pt here for one week post c-section. Pt denies fever, chills. Pt states small amount of vaginal bleeding. Pt reports pain well managed.

## 2020-12-22 NOTE — Progress Notes (Signed)
Rachel [name] is a 35 y.o. female, DOB Sep 19, 1985, who presents today for her postoperative exam.      History of Present Illness  HPI   Rachel [name] presents for her 1 week postoperative incision check.  On 12/13/20, she had a primary c-section at [redacted]w[redacted]d due to the baby being in breech presentation.  She also had a tubal ligation.  She denies fevers, chills, abnormal vaginal discharge, or bleeding.  Her postoperative pain has steadily improved a little each day.  She still has occasional runs of PVCs.  She just ran out of her metoprolol.  She left a message for her PCP, and wanted to know if she could get a prescription to hold her over until she hears back from her PCP.         Review of Systems     Constitutional:  Denies fevers or chills  Neurologic:  Denies headaches or neuropathy  CV:  Denies chest pain or palpitations  PULM:  Denies shortness of breath or cough  GI:  Denies diarrhea or constipation  GU:  Denies dysuria or hematuria  Ext:  Denies any musculoskeletal issues    Vitals:    12/22/20 0838   BP: 124/70   Pulse: 68   Temp: 36.7 ?C (98.1 ?F)   SpO2: 97%    Body mass index is 29.79 kg/m?Marland Kitchen  Physical Exam    General:  NAD  CV:  RRR  Abdomen:  Soft, non-tender, non-distended  Incision:  Clean, dry, and intact with no erythema or drainage  Ext:  No calf pain or swelling  Pelvic:  Deferred    ASSESSMENT & PLAN:       ICD-10-CM    1. Postoperative examination  Z09    2. PVC (premature ventricular contraction)  I49.3 metoprolol tartrate (LOPRESSOR) 25 MG tablet     1.  Postoperative Exam - Rachel [name] is recovering well from her c-section.  Her incision is healing well.  Basic postoperative instructions were reviewed in detail.  She denies any symptoms of postpartum depression, and has a good support system.      2.  Premature Ventricular Contraction (PVC) - I gave Rachel [name] a refill of her metoprolol (20 tablets) to hold her over until she can be evaluated by her PCP.

## 2020-12-28 MED ORDER — metoprolol tartrate (LOPRESSOR) 25 MG tablet
25 | ORAL_TABLET | Freq: Two times a day (BID) | ORAL | 3 refills | 90.00000 days | Status: DC
Start: 2020-12-28 — End: 2020-12-30

## 2020-12-30 MED ORDER — verapamil ER (CALAN-SR) 120 MG CR tablet
120 | ORAL_TABLET | Freq: Every morning | ORAL | 3 refills | 30.00000 days | Status: DC
Start: 2020-12-30 — End: 2021-02-07

## 2020-12-30 NOTE — Addendum Note (Signed)
Addended by: Veleta Miners on: 12/30/2020 02:51 PM     Modules accepted: Orders

## 2020-12-30 NOTE — Telephone Encounter (Signed)
Incoming call from patient stated that she was seen in the ER recently and was referred to cardiology and was inform that appt may take up to one year for patient to be seen unless she is admitted to the hospital. Patient is c/o chest pain and heaviness on chest, patient is experiencing bad anxiety and that medication metoprolol was not effective after dose was increase and patient is currently monitor her heart rate and it has been in the 50's. Patient want to see if she can see the cardiology sooner and if medication need to be switch because she does not feel medication is not working. I verbalized understanding and stated that message will sent to Dr. Marcos Eke and will f/u with patient. Patient states understanding and have no questions.     Orlie Dakin, LPN

## 2020-12-30 NOTE — Telephone Encounter (Signed)
Patient was advised of message below and verbalized understanding. Patient said that [facility] will not see her for at least a year. She asked for referral to go to [facility]. She is starting to feel very anxious about this. ASAP referral sent.      Karn Cassis, LPN

## 2020-12-30 NOTE — Addendum Note (Signed)
Addended by: Karn Cassis on: 12/30/2020 03:14 PM     Modules accepted: Orders

## 2020-12-30 NOTE — Telephone Encounter (Signed)
I switched her to a calcium channel blocker instead. Please stop metoprolol when you pick up the verapamil. It's once a day. Thanks.

## 2021-01-01 DIAGNOSIS — R002 Palpitations: Secondary | ICD-10-CM

## 2021-01-01 NOTE — ED Notes (Signed)
Dr Ether Griffins at the bedside. This RN assisting primary RN in care.

## 2021-01-01 NOTE — ED Notes (Signed)
Bed: 512-01  Expected date: 01/01/21  Expected time:   Means of arrival: Car  Comments:

## 2021-01-01 NOTE — ED Triage Notes (Signed)
Pt presents with c/o burning chest pain that is left sided and radiates into back, pt voices feeling as if heart is palpating/ racing
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
## 2021-01-01 NOTE — Discharge Instructions (Addendum)
Monitor your symptoms.  Continue to take verapamil as prescribed.  Call Dr. Trey Paula office on Monday.  He has assured me that he will call the front desk staff and make sure he gets seen in the next week or 2.  In the meantime assess how verapamil is working.  I would also recommend scheduling a follow-up appointment with Dr. Jayme Cloud for later next week.  If you develop severe or worsening chest pain severe shortness of breath syncopal episode or any other emergent needs return emergency department immediately for reevaluation.

## 2021-01-02 ENCOUNTER — Inpatient Hospital Stay: Admit: 2021-01-02 | Discharge: 2021-01-02 | Disposition: A | Discharge status: Home / Self Care | Attending: DO

## 2021-01-02 LAB — CBC WITH AUTO DIFFERENTIAL
Basophils %: 1 % (ref 0–2)
Basophils, Absolute: 0 10*3/??L (ref 0.0–0.1)
Eosinophils %: 2 % (ref 0–5)
Eosinophils, Absolute: 0.1 10*3/??L (ref 0.0–0.4)
HCT: 39.1 % (ref 35.0–48.0)
Hemoglobin: 12.6 g/dL (ref 11.7–16.5)
Lymphocytes %: 30 % (ref 20–40)
Lymphocytes, Absolute: 1.8 10*3/??L (ref 1.0–4.0)
MCH: 24.8 pg — ABNORMAL LOW (ref 28.3–33.3)
MCHC: 32.1 g/dL — ABNORMAL LOW (ref 32.5–36.0)
MCV: 77.3 fL — ABNORMAL LOW (ref 81.0–100.0)
MPV: 7.9 fL (ref 6.9–10.0)
Monocytes %: 10 % (ref 2–12)
Monocytes, Absolute: 0.6 10*3/??L (ref 0.2–1.0)
Neutrophils %: 58 % (ref 50–74)
Neutrophils, Absolute: 3.5 10*3/??L (ref 2.0–7.4)
Platelet Count: 229 10*3/??L (ref 150–405)
RBC: 5.06 10*6/??L (ref 3.80–5.60)
RDW: 17.4 % — ABNORMAL HIGH (ref 11.7–16.1)
WBC: 6.1 10*3/??L (ref 4.5–11.0)

## 2021-01-02 LAB — BASIC METABOLIC PANEL
Anion Gap: 6.5 mmol/L — ABNORMAL LOW (ref 9.0–18.0)
BUN / Creatinine Ratio: 25 — ABNORMAL HIGH (ref 12.0–20.0)
BUN: 14 mg/dL (ref 8–20)
CO2 - Carbon Dioxide: 26 mmol/L (ref 17–27)
Calcium: 9.1 mg/dL (ref 8.6–10.6)
Chloride: 101.5 mmol/L (ref 101.0–111.0)
Creatinine: 0.56 mg/dL — ABNORMAL LOW (ref 0.60–1.30)
Glomerular Filtration Rate Estimate (Female): >90 mL/min/{1.73_m2} (ref >=60)
Glucose: 93 mg/dL (ref 74–106)
Osmolality Calculation: 268.4 mOsm/kg — ABNORMAL LOW (ref 275.0–300.0)
Potassium: 3.84 mmol/L (ref 3.50–5.10)
Sodium: 134 mmol/L — ABNORMAL LOW (ref 135–145)

## 2021-01-02 LAB — MAGNESIUM: Magnesium: 2 mg/dL (ref 1.3–2.5)

## 2021-01-02 LAB — ED INFORMATION EXCHANGE

## 2021-01-02 LAB — TSH: TSH - Thyroid Stimulating Hormone: 2.38 ??IU/mL (ref 0.34–5.60)

## 2021-01-02 LAB — TROPONIN T: Troponin T: <0.01 ng/mL (ref 0.00–0.01)

## 2021-01-03 NOTE — Telephone Encounter (Signed)
3  Emergency Room Follow up Care Coordination Phone Call    YNW:GNFA Marcos Eke, MD    Date of ED visit:01/01/21  Reason for ED visit: chest pain      Telephone call to patient to follow up recent Emergency Room visit. Spoke with Shanda Bumps.    Overall, how are you (or the patient) doing now?   Patient states that she is doing much better.     Were there any changes or new medications added during Emergency Room visit or when you were discharged?  No    Do you have all of your medications? Have you started taking them?    Patient states that she has appt coming up with Cardiology 01/18/21. I asked her to please call us if she has any concerns.     Any other questions or concerns?       Follow up appointment, (patient reminded to bring discharge paperwork and all medication bottles/packages to appointment):   Future Appointments   Date Time Provider Department Center   01/18/2021 10:15 AM Josiah Lobo, FNP [facility]   01/25/2021  1:00 PM Conni Elliot, MD [facility]          Assessment/Plan (any follow up labs, tests or referrals?):       Karn Cassis, LPN

## 2021-01-17 NOTE — Telephone Encounter (Signed)
Talked to Medical assistant about appointment for the patient being a hospital follow up and should be a new patient.  Talked with Dr. Marcene Brawn and he advised he spoke with one of her providers and we needed to get her in to be seen.  Called patient and scheduled as follows.    Appointments which have been scheduled for you    Jan 25, 2021 10:45 AM  NEW PATIENT with Joslyn Hy, MD  [facility]) [address]  [place] [address]  [phone]      Jan 25, 2021  1:00 PM  Post Partum 6 Week with Conni Elliot, MD  [facility]) [address]  [place] [address]  [phone]

## 2021-01-18 ENCOUNTER — Encounter: Attending: FNP | Primary: MD

## 2021-01-20 NOTE — Addendum Note (Signed)
Addended by: Margretta Sidle on: 01/20/2021 07:17 AM     Modules accepted: Orders

## 2021-01-25 ENCOUNTER — Encounter: Attending: MD | Primary: MD

## 2021-01-25 ENCOUNTER — Ambulatory Visit
Admit: 2021-01-25 | Discharge: 2021-01-25 | Attending: Student in an Organized Health Care Education/Training Program | Primary: MD

## 2021-01-25 DIAGNOSIS — I440 Atrioventricular block, first degree: Secondary | ICD-10-CM

## 2021-01-25 NOTE — Progress Notes (Signed)
Outpatient Cardiology Evaluation      Patient: Rachel [name]   DOB: April 22, 1985        Reason for Cardiology Exam:   Chief Complaint   Patient presents with   . Cardiac Management     Pt reports having frequent PVC's. Presents as a constant chest pain. After eliminating caffeine and other negative factors, pt is till experiencing sx's including dizzy spells.      This is a 35 year old female with no significant cardiac history who presents to the cardiology clinic for palpitations.  Patient states that this has been an ongoing issue for her since the past 3 months.  She has been to the emergency room multiple times with the same complaint.  She notes that she was pregnant with her fourth child, and started having palpitations towards the end of her pregnancy.  She has undergone extensive work-up including CT imaging that ruled out PE, 2D echocardiogram which was negative for structural heart disease and valvular pathology.  She has had Holter monitor and multiple EKGs which have confirmed PVCs, bigeminy, pricing of a total of 1% of all beats.  Patient was initially started on metoprolol, however did not note any difference and was switched to verapamil.  Currently she is on 120 mg of extended release verapamil.  She has been taking this medication at night and notices episodes of dizziness and tremors immediately after.  These effects seem to wear off in an hour or two.   She has cut down on her caffeine intake, denies energy drinks or recreational drug use.  She denies any significant family history of premature coronary artery disease or sudden cardiac death.  She has been compliant with her medications and doctors visits.    Medications:   Current Outpatient Medications on File Prior to Visit   Medication Sig Dispense Refill   . buPROPion (WELLBUTRIN) 75 MG tablet Take 1 tablet by mouth 2 times daily.     Marland Kitchen docusate sodium (COLACE) 100 MG capsule Take 1 capsule by mouth 2 times daily. 30 capsule    . metFORMIN  (GLUCOPHAGE) 500 MG tablet Take 1 tablet by mouth 3 times daily.     . prenatal vit-iron fum-folic ac 27 mg iron- 0.8 mg Tab tablet Take 1 tablet by mouth once daily.     . verapamil ER (CALAN-SR) 120 MG CR tablet Take 1 tablet by mouth every morning. 90 tablet 3   . omeprazole DR (PRILOSEC) 20 MG capsule Take 1 capsule by mouth daily. (Patient not taking: Reported on 12/22/2020) 30 capsule 3   . [DISCONTINUED] norethindrone-ethinyl estradiol (JUNEL FE 1/20) 1 mg-20 mcg (21)/75 mg (7) per tablet Take 1 tablet by mouth daily. (Patient not taking: No sig reported) 84 tablet 3     No current facility-administered medications on file prior to visit.       Allergies/Intolerances:   Allergies   Allergen Reactions   . Adhesive Hives     Paper tape   . Latex Hives and Rash   . Sulfa (Sulfonamide Antibiotics) Hives and Rash   . Morphine Other (See Comments)     Headache   . Norco [Hydrocodone-Acetaminophen] Other (See Comments)     Nightmares        General: Negative for fever and fatigue/weakness.    Cardiovascular: Positive for palpitations.Negative for chest pain at rest, chest pain with exercise, peripheral edema, PND, orthopnea, shortness of breath and dyspnea on exertion.    Neuro: Positive for dizziness.Negative for  vertigo.    Skin: Negative for rashes.    Eyes: Negative for blurred vision and eye irritation.    ENT: Negative for sore throat.    Genitourinary: Negative for incontinence and dysuria.    Respiratory: Negative for cough.    Psychiatric: Negative for depression.    Endocrine: Negative for cold intolerance and heat intolerance.    Musculoskeletal: Negative for back pain and joint pain.    Gastrointestinal: Negative for nausea and vomiting.    Blood/Lymph: Negative for easy bruising.      Physical Exam  Constitutional:       Appearance: Normal appearance.   HENT:      Head: Normocephalic and atraumatic.      Nose: Nose normal.      Mouth/Throat:      Mouth: Mucous membranes are moist.   Eyes:      Pupils:  Pupils are equal, round, and reactive to light.   Cardiovascular:      Rate and Rhythm: Normal rate and regular rhythm.   Pulmonary:      Effort: Pulmonary effort is normal.      Breath sounds: Normal breath sounds.   Abdominal:      General: Abdomen is flat.      Palpations: Abdomen is soft.   Musculoskeletal:         General: Normal range of motion.      Cervical back: Normal range of motion.   Skin:     General: Skin is warm.   Neurological:      General: No focal deficit present.      Mental Status: She is alert.   Psychiatric:         Mood and Affect: Mood normal.               Assessment     1.  Palpitations  2.  Frequent PVCs  3.  First-degree AV block    Plan      1.  Patient has had multiple episodes of palpitations, and she continues to be symptomatic with the same.  She has been on 120 mg of verapamil extended release with some benefit.  2.  We discussed treatment options including initiation of flecainide, and referral to electrophysiology for ablation of PVCs.  3.  However the patient wants to try out rate control agent first, this seems to be a reasonable option.  4.  Advised the patient to continue with her current medication and take note of her symptoms.  5.  Low threshold to switch to Bystolic 5 mg twice daily, if current therapy fails to resolve her symptoms.  6.  Patient was advised to present to the cardiology clinic with worsening of her symptoms, she will follow-up with [place] in 10 days  7.  Recent EKGs, echocardiogram, CTA, bloodwork were reviewed and results were discussed at length with the patient.  8.  Lifestyle modifications including increasing activity levels, avoiding caffeinated beverages, avoiding recreational drug use were discussed at length.

## 2021-01-25 NOTE — Progress Notes (Signed)
Patient presenting today for Cardiac Management (Pt reports having frequent PVC's. Presents as a constant chest pain. After eliminating caffeine and other negative factors, pt is till experiencing sx's including dizzy spells.)  .     Vitals:    01/25/21 1113   BP: 108/74   BP Location: Left arm   Patient Position: Sitting   Pulse: 74   Resp: 16   SpO2: 98%   Weight: 165 lb (74.8 kg)   Height: 5' 5 (1.651 m)       HOME OXYGEN:  Assistive Devices Used at Home  Home Oxygen: No    Alcohol Use: Not on file        reports no history of drug use.     Tobacco Use: Medium Risk   . Smoking Tobacco Use: Former   . Smokeless Tobacco Use: Never   . Passive Exposure: Not on file       EXERCISE:  Do you currently exercise?: Yes  On average, how many days per week do you engage in moderate to strenuous exercise (like a brisk walk)?: 4 days  On average, how many minutes do you engage in exercise at this level?: 30 min  Type of Exercise: Treadmill       EKG Done  EKG ordered: Yes  EKG given to: Dr. Allena Katz  EKG performed by: Deatra James, CMA  Electrocardiogram was performed with this visit. Please see separate EKG document in ECG tab for interpretation.

## 2021-02-01 ENCOUNTER — Encounter: Attending: MD | Primary: MD

## 2021-02-02 ENCOUNTER — Other Ambulatory Visit: Primary: MD

## 2021-02-02 ENCOUNTER — Ambulatory Visit: Admit: 2021-02-02 | Discharge: 2021-02-02 | Attending: MD | Primary: MD

## 2021-02-02 DIAGNOSIS — Z124 Encounter for screening for malignant neoplasm of cervix: Secondary | ICD-10-CM

## 2021-02-02 LAB — SLM CP PAP
Final Gynecologic Interpretation: NEGATIVE
Molecular Results: NEGATIVE

## 2021-02-02 MED ORDER — metFORMIN (GLUCOPHAGE) 500 MG tablet
500 | ORAL_TABLET | Freq: Two times a day (BID) | ORAL | 3 refills | 30.00000 days | Status: DC
Start: 2021-02-02 — End: 2021-06-29

## 2021-02-02 NOTE — Progress Notes (Signed)
Pt is here for 6 week postpartum visit, delivered on 12/13/20. Pt states she is doing well emotionally, has a good support system. Pt states no vaginal bleeding and does not believes she has had a period since delivery yet. Pt states she is not breastfeeding. Pt states no need for birth control, pt had tubal ligation.     PHQ-9 Total: 2 (02/02/2021  9:03 AM)

## 2021-02-02 NOTE — Progress Notes (Signed)
Rachel [name] is a 35 y.o. female, DOB 1985-05-31, who presents today for her postpartum exam.      History of Present Illness  HPI   Rachel [name] presents for her 6 week postpartum exam.  On 12/13/20, she had a c-section at [redacted]w[redacted]d due to the baby being breech.  She also has a tubal ligation.  She denies fevers, chills, abnormal vaginal discharge, or bleeding.  She has almost no postoperative pain.  She is bottle feeding, because she was placed on cardiac medication to help with her heart rate.  She started to struggle with postpartum depression, and restarted her wellbutrin which has helped.  She would like to get a pap smear done today.         Review of Systems     Constitutional: ?Denies fevers or chills  Neurologic: ?Denies headaches or neuropathy  CV: ?Denies chest pain or palpitations  PULM: ?Denies shortness of breath or cough  GI: ?Denies diarrhea or constipation  GU: ?Denies dysuria or hematuria  Ext: ?Denies any musculoskeletal issues    Vitals:    02/02/21 0854   BP: 120/78   Pulse: 75   Temp: 36.7 ?C (98 ?F)   SpO2: 96%    Body mass index is 29.82 kg/m?Marland Kitchen  Physical Exam    General: ?NAD  CV: ?RRR  Abdomen: ?Soft, nondistended?nontender  Incision:  Clean, dry, and intact with no erythema or drainage  Ext: ?No calf pain or swelling  External genitalia: ?Normal appearance, no lesions  Urethral meatus: ?Normal size and location, no lesions  Urethra: ?No masses or tenderness  Vagina: ?No abnormal discharge  Cervix: ?No discharge or lesion, no cervical motion tenderness  Uterus: ?Normal size, mobile, no tenderness  Adnexa: ?Normal size, no tenderness    ASSESSMENT & PLAN:       ICD-10-CM    1. Postpartum exam  Z39.2         1.  Postpartum Exam - Rachel [name] has healed and recovered well from her c-section and tubal ligation.  I will notify her of the pap smear results.  I sent in a refill prescription of her metformin for her PCOS.  She will RTC as needed.

## 2021-02-07 MED ORDER — nebivoloL (BYSTOLIC) 5 MG tablet
5 | ORAL_TABLET | Freq: Every day | ORAL | 1 refills | 90.00000 days | Status: DC
Start: 2021-02-07 — End: 2021-06-29

## 2021-02-07 NOTE — Telephone Encounter (Signed)
Per Dr. Allena Katz, the patient needs to stop Verapamil and start Bystolic.  She is to monitor BP/HR and notify us if there are any problems.  She will keep her appt as scheduled to f/u on this.  Rx sent.  The patient verbalized an understanding to the instructions and plan of care.  Cheral Bay, RN     Future Appointments   Date Time Provider Department Center   02/24/2021 12:45 PM Joslyn Hy, MD [facility]       Current Outpatient Medications:   .  buPROPion (WELLBUTRIN) 75 MG tablet, Take 1 tablet by mouth 2 times daily., Disp: , Rfl:   .  docusate sodium (COLACE) 100 MG capsule, Take 1 capsule by mouth 2 times daily., Disp: 30 capsule, Rfl:   .  metFORMIN (GLUCOPHAGE) 500 MG tablet, Take 1 tablet by mouth 2 times daily with meals., Disp: 60 tablet, Rfl: 3  .  nebivoloL (BYSTOLIC) 5 MG tablet, Take 1 tablet by mouth once daily., Disp: 30 tablet, Rfl: 1  .  omeprazole DR (PRILOSEC) 20 MG capsule, Take 1 capsule by mouth daily. (Patient not taking: Reported on 12/22/2020), Disp: 30 capsule, Rfl: 3  .  prenatal vit-iron fum-folic ac 27 mg iron- 0.8 mg Tab tablet, Take 1 tablet by mouth once daily., Disp: , Rfl:

## 2021-02-07 NOTE — Telephone Encounter (Signed)
Patient reports bigeminy and possibly afib with current therapy; 1/2 tab Verapamil BID.  This does cause some chest pains.  Her HR was in the 50's and irregular.  She does use a watch that has EKG capability and it did say afib at that time.  She is still getting HA's with this.  She inquires if maybe she needs to be switched to Bystolic or Flecainide as previously discussed.  To Joslyn Hy MD for review.  Cheral Bay, RN     Future Appointments   Date Time Provider Department Center   02/24/2021 12:45 PM Joslyn Hy, MD [facility]

## 2021-02-22 LAB — LIPID PANEL W/ REFLEX DIRECT LDL
Chol/HDL Ratio: 3 (ref 0.0–5.0)
Cholesterol, HDL: 50 mg/dL — ABNORMAL LOW (ref >=60)
Cholesterol: 149 mg/dL (ref <=200)
LDL Calculated: 87 mg/dL (ref 0–129)
Triglyceride: 60 mg/dL (ref <=150.0)
VLDL Cholesterol Calculation: 12 mg/dL (ref 7.0–32.0)

## 2021-02-22 LAB — QUANTIFERON - TB GOLD PLUS
Mitgoen minus Nil Result: 9.94 IU/mL
Nil Result: 0.06 IU/mL
QuantiFERON-Tb Gold Plus Result: NEGATIVE
TB1 Ag minus Nil Result: 0 IU/mL
TB2 Ag minus Nil Result: -0.02 IU/mL

## 2021-02-22 LAB — HEPATITIS B SURFACE ANTIBODY (SLM): Hepatitis B Surface Antibody Interpretation: POSITIVE — AB

## 2021-02-22 LAB — GLYCO-HEMOGLOBIN A1C: Glycohemoglobin (A1c): 5 % (ref <=5.6)

## 2021-02-23 NOTE — Telephone Encounter (Signed)
Called and left a voicemail message regarding upcoming appointment. If patient calls back please confirm appointment 02/24/21 at 12:45pm

## 2021-02-24 ENCOUNTER — Ambulatory Visit
Admit: 2021-02-24 | Discharge: 2021-02-24 | Attending: Student in an Organized Health Care Education/Training Program | Primary: MD

## 2021-02-24 DIAGNOSIS — I493 Ventricular premature depolarization: Secondary | ICD-10-CM

## 2021-02-24 NOTE — Progress Notes (Signed)
Outpatient Cardiology Evaluation      Patient: Rachel [name]   DOB: Nov 09, 1985        Reason for Cardiology Exam:   Chief Complaint   Patient presents with   . Cardiac Management   . Dizziness      This is a 35 year old female with no significant cardiac history who presents to the cardiology clinic for a follow-up visit for palpitations.  Patient was seen and evaluated 1 month ago for palpitations.  Patient states that she had been symptomatic since the past 3 months.  She has been to the emergency room multiple times with the same complaint.  She notes that she was pregnant with her fourth child, and started having palpitations towards the end of her pregnancy.  She has undergone extensive work-up including CT imaging that ruled out PE, 2D echocardiogram which was negative for structural heart disease and valvular pathology.  She has had Holter monitor and multiple EKGs which have confirmed PVCs, bigeminy, pricing of a total of 1% of all beats.  Patient was initially started on metoprolol, however did not note any difference and was switched to verapamil.  She had been taking this medication at night and notices episodes of dizziness and tremors immediately after.  We had switched her from verapamil to Bystolic 5 mg twice daily.  Since initiation of her Bystolic the patient had episodes of dizziness and attributed that to low blood pressure.  She stopped the Bystolic about 10 days ago.  She has had no recurrence of her symptoms since then.  She denies dizziness, palpitations, lightheadedness, presyncope, episodes of loss of consciousness.  Currently she is not on any medications for rate control and has been asymptomatic.  She has cut down on her caffeine intake, denies energy drinks or recreational drug use.  She denies any significant family history of premature coronary artery disease or sudden cardiac death.  She has been compliant with her medications and doctors visits.    Medications:   Current  Outpatient Medications on File Prior to Visit   Medication Sig Dispense Refill   . buPROPion (WELLBUTRIN) 75 MG tablet Take 1 tablet by mouth 2 times daily.     Marland Kitchen docusate sodium (COLACE) 100 MG capsule Take 1 capsule by mouth 2 times daily. 30 capsule    . metFORMIN (GLUCOPHAGE) 500 MG tablet Take 1 tablet by mouth 2 times daily with meals. 60 tablet 3   . omeprazole DR (PRILOSEC) 20 MG capsule Take 1 capsule by mouth daily. 30 capsule 3   . prenatal vit-iron fum-folic ac 27 mg iron- 0.8 mg Tab tablet Take 1 tablet by mouth once daily.     . nebivoloL (BYSTOLIC) 5 MG tablet Take 1 tablet by mouth once daily. (Patient not taking: Reported on 02/24/2021) 30 tablet 1   . [DISCONTINUED] norethindrone-ethinyl estradiol (JUNEL FE 1/20) 1 mg-20 mcg (21)/75 mg (7) per tablet Take 1 tablet by mouth daily. (Patient not taking: No sig reported) 84 tablet 3     No current facility-administered medications on file prior to visit.       Allergies/Intolerances:   Allergies   Allergen Reactions   . Adhesive Hives     Paper tape   . Latex Hives and Rash   . Sulfa (Sulfonamide Antibiotics) Hives and Rash   . Morphine Other (See Comments)     Headache   . Norco [Hydrocodone-Acetaminophen] Other (See Comments)     Nightmares  General: Negative for fever and fatigue/weakness.    Cardiovascular: Negative for chest pain at rest, chest pain with exercise, palpitations, peripheral edema, PND, orthopnea, shortness of breath and dyspnea on exertion.    Neuro: Negative for vertigo and dizziness.    Skin: Negative for rashes.    Eyes: Negative for blurred vision and eye irritation.    ENT: Negative for sore throat.    Genitourinary: Negative for incontinence and dysuria.    Respiratory: Negative for cough.    Psychiatric: Negative for depression.    Endocrine: Negative for cold intolerance and heat intolerance.    Musculoskeletal: Negative for back pain and joint pain.    Gastrointestinal: Negative for nausea and  vomiting.    Blood/Lymph: Negative for easy bruising.      Physical Exam  Constitutional:       Appearance: Normal appearance.   HENT:      Head: Normocephalic and atraumatic.      Nose: Nose normal.      Mouth/Throat:      Mouth: Mucous membranes are moist.   Eyes:      Pupils: Pupils are equal, round, and reactive to light.   Cardiovascular:      Rate and Rhythm: Normal rate and regular rhythm.   Pulmonary:      Effort: Pulmonary effort is normal.      Breath sounds: Normal breath sounds.   Abdominal:      General: Abdomen is flat.      Palpations: Abdomen is soft.   Musculoskeletal:         General: Normal range of motion.      Cervical back: Normal range of motion.   Skin:     General: Skin is warm.   Neurological:      General: No focal deficit present.      Mental Status: She is alert.   Psychiatric:         Mood and Affect: Mood normal.               Assessment     1.  Palpitations,now resolved  2.  Frequent PVCs  3.  First-degree AV block    Plan      1.  Patient has been on multiple occasions for rate control including metoprolol, verapamil, bystolic.  However currently she is off all rate control agents and has remained asymptomatic.  It would be reasonable to follow-up in 4 to 6 weeks for recurrence of symptoms  2.  We discussed treatment options including initiation of flecainide, and referral to electrophysiology for ablation of PVCs, if needed in the future.  3.  Patient was advised to present to the cardiology clinic with worsening of her symptoms   4.  Recent EKGs, echocardiogram, CTA, bloodwork were reviewed and results were discussed at length with the patient.  5.  Lifestyle modifications including increasing activity levels, avoiding caffeinated beverages, were discussed at length.

## 2021-02-24 NOTE — Progress Notes (Signed)
Patient presenting today for Cardiac Management  .     Vitals:    02/24/21 1245   BP: 108/62   BP Location: Left arm   Patient Position: Sitting   Pulse: 73   Resp: 18   SpO2: 98%   Weight: 180 lb 3.2 oz (81.7 kg)   Height: 5' 5 (1.651 m)       HOME OXYGEN:  Assistive Devices Used at Home  Home Oxygen: No    Alcohol Use: Not At Risk   . Frequency of Alcohol Consumption: Never   . Average Number of Drinks: Patient does not drink   . Frequency of Binge Drinking: Never        reports no history of drug use.     Tobacco Use: Medium Risk   . Smoking Tobacco Use: Former   . Smokeless Tobacco Use: Never   . Passive Exposure: Not on file       EXERCISE:  Do you currently exercise?: Yes  On average, how many days per week do you engage in moderate to strenuous exercise (like a brisk walk)?: 3 days  On average, how many minutes do you engage in exercise at this level?: 30 min  Type of Exercise: Treadmill     Weight is up 1 pound.

## 2021-02-25 NOTE — Telephone Encounter (Addendum)
Pt advised of note below, verbalized understanding.      ----- Message from Conni Elliot, MD sent at 02/25/2021  8:53 AM PST -----  Please notify patient of her normal pap smear results.

## 2021-04-12 MED ORDER — buPROPion (WELLBUTRIN) 75 mg tablet
75 | ORAL_TABLET | Freq: Two times a day (BID) | ORAL | 3 refills | 90.00000 days | Status: DC
Start: 2021-04-12 — End: 2021-12-20

## 2021-04-12 NOTE — Telephone Encounter (Signed)
Requested Prescriptions     Pending Prescriptions Disp Refills   . buPROPion (WELLBUTRIN) 75 mg tablet 90 tablet 3     Sig: Take 1 tablet by mouth 2 times daily.     Last filled:  03/17/2020 #180 +3 refills    Last OV:  03/17/2020 Veleta Miners, MD    Future Appointments   Date Time Provider Department Center   05/12/2021  1:15 PM Joslyn Hy, MD [facility]      Medication is active on list and no changes have been made.    Medication Pended to [facility].    BP Readings from Last 3 Encounters:   02/24/21 108/62   02/02/21 120/78   01/25/21 108/74     Pulse Readings from Last 3 Encounters:   02/24/21 73   02/02/21 75   01/25/21 74     Lab Results   Component Value Date    NA 134 (L) 01/01/2021    K 3.84 01/01/2021    CL 101.5 01/01/2021    CO2 26 01/01/2021    ANIONGAP 6.5 (L) 01/01/2021    CALCIUM 9.1 01/01/2021    BUN 14 01/01/2021    CREATININES 0.56 (L) 01/01/2021    LABGLOM >90 01/01/2021    BCR 25.0 (H) 01/01/2021    GLU 93 01/01/2021

## 2021-05-12 ENCOUNTER — Encounter: Attending: Student in an Organized Health Care Education/Training Program | Primary: MD

## 2021-06-01 NOTE — Telephone Encounter (Signed)
called to inform Janney Dr Allena Katz will be out of office and she is rescheduled with Laban Emperor FNP for the following day 06/08/2021 for 8:45am. -AP

## 2021-06-07 ENCOUNTER — Encounter: Attending: Student in an Organized Health Care Education/Training Program | Primary: MD

## 2021-06-08 ENCOUNTER — Encounter: Attending: FNP | Primary: MD

## 2021-06-29 ENCOUNTER — Ambulatory Visit: Admit: 2021-06-29 | Discharge: 2021-06-29 | Attending: FNP | Primary: MD

## 2021-06-29 DIAGNOSIS — R002 Palpitations: Secondary | ICD-10-CM

## 2021-06-29 NOTE — Progress Notes (Signed)
Patient presenting today for Cardiac Management (No cardiac complaints at presentation. Patient states she has had some episodes of PVCs, but not a lot, she feels like they are getting less and less. Patient is here for 2 months follow up.)  .     Vitals:    06/29/21 1547   BP: 110/70   BP Location: Left arm   Patient Position: Sitting   Pulse: 72   Resp: 16   SpO2: 98%   Weight: 177 lb (80.3 kg)   Height: 5' 5 (1.651 m)       HOME OXYGEN:  Assistive Devices Used at Home  Home Oxygen: No    Alcohol Use: Not At Risk   . Frequency of Alcohol Consumption: Never   . Average Number of Drinks: Patient does not drink   . Frequency of Binge Drinking: Never        reports no history of drug use.     Tobacco Use: Medium Risk   . Smoking Tobacco Use: Former   . Smokeless Tobacco Use: Never   . Passive Exposure: Not on file       EXERCISE:  Do you currently exercise?: Yes  On average, how many days per week do you engage in moderate to strenuous exercise (like a brisk walk)?: 4 days  Type of Exercise: Other (Cardio)     Weight is down 3 pounds from last cardiology visit.
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
## 2021-07-05 NOTE — Telephone Encounter (Signed)
-----   Message from [place]. Nolt sent at 07/05/2021 11:04 AM PDT -----  Regarding: Hormone imbalance  Contact: [phone]  Hey Dr. Yates Decamp been having issues since having baby. I have only had like 3 periods. I can feel that my hormones are all off. With hormones being off comes mood swings and just feeling blah. I have no motivation either. I would say I feel completely different after this delivery then I have ever and this is the first time my periods have been so messed up. I was wondering if having part of my tubes removed could have caused this hormone shift and if so, there is anything that can be done about it?     Rachel [name]

## 2021-07-07 ENCOUNTER — Ambulatory Visit: Payer: Medicaid Other | Admitting: Physician Assistant

## 2021-07-08 MED ORDER — norgestimate-ethinyl estradioL (ORTHO-CYCLEN) 0.25-35 mg-mcg per tablet
0.25-0.035 | ORAL_TABLET | Freq: Every day | ORAL | 2 refills | 84.00000 days | Status: DC
Start: 2021-07-08 — End: 2021-12-20

## 2021-07-08 NOTE — Telephone Encounter (Signed)
I called to check in with Rachel [name] who is struggling with abnormal uterine bleeding and mood shifts.  I sent her in a prescription for orthocyclen to try and get her bleeding back into a regular cycle.

## 2021-08-31 NOTE — Telephone Encounter (Signed)
-----   Message from [place]. Hudock sent at 08/31/2021  1:43 PM PDT -----  Regarding: Pain  Contact: [phone]  Hey Dr. Laurence Compton!  I had a question. I have noticed that I have this pulling/sharp pain that wraps around my left side into my lower back. It starts in my pelvic area. Could it be scar tissue related due to my section?? If so, is there a way to fix it?? It's pretty uncomfortable.     Rachel [name]

## 2021-08-31 NOTE — Telephone Encounter (Signed)
-----   Message from Lashannon M. Herling sent at 08/31/2021  1:43 PM PDT -----  Regarding: Pain  Contact: [phone]  Hey Dr. Barton!  I had a question. I have noticed that I have this pulling/sharp pain that wraps around my left side into my lower back. It starts in my pelvic area. Could it be scar tissue related due to my section?? If so, is there a way to fix it?? It's pretty uncomfortable.     Rachel [name]

## 2021-09-01 NOTE — Telephone Encounter (Signed)
I returned [name]'s phone call and answered all her questions about the pulling sensation.  I informed her that it could be scar tissue.  I offered to order a pelvic ultrasound and/or set her up with physical therapy, but she declined at this time.  Precautions were reviewed as to when she should call the clinic.

## 2021-10-19 ENCOUNTER — Encounter: Attending: PA | Primary: MD

## 2021-11-18 NOTE — Telephone Encounter (Signed)
-----   Message from Rachel [name] sent at 11/18/2021  9:53 AM PDT -----  Regarding: Pain  Contact: [phone]  Hey Dr. Laurence Compton    I have continued to have horrible LLQ/Flank pain that goes into my back that started a few months after my section. I really wonder if it's scar tissue or my ovary is stuck to something. This is what happen last time my ovary was adhered to my intestines. I don't know how to fix this but stretching, massages, and over the counter medication including lidocaine patches aren't working. I can take 800 of ibuprofen and a gram of Tylenol with no relief. This pain get worst during my period and ovulation as well.  Any way we can do an ultrasound or something??

## 2021-11-22 NOTE — Telephone Encounter (Signed)
I talked to [place] today, and placed an order for a pelvic ultrasound.  I also placed a referral for pelvic floor physical therapy due to the chronic pelvic pain.

## 2021-11-30 ENCOUNTER — Inpatient Hospital Stay: Admit: 2021-11-30 | Discharge: 2021-12-23 | Attending: MD | Primary: MD

## 2021-11-30 DIAGNOSIS — R102 Pelvic and perineal pain: Secondary | ICD-10-CM

## 2021-12-02 NOTE — Progress Notes (Unsigned)
TC to patient to schedule appointment per patient and provider request no answer. Will try again later.

## 2021-12-02 NOTE — Telephone Encounter (Signed)
Regarding: PPD  Contact: [phone]  ----- Message from Veleta Miners, MD sent at 12/02/2021  9:44 AM PDT -----  Please schedule next avail, ok to use blocked spot or same day spot. Thanks.     ----- Message sent from Veleta Miners, MD to Geralynn Ochs at 12/02/2021  9:43 AM -----   Thanks for the message. I will ask my staff to give you a call to get something scheduled next available.       ----- Message -----       From:Anneka De Blanch       Sent:11/29/2021  5:41 PM PDT         KG:MWNU Marcos Eke, MD    Subject:PPD    Hey Dr.Gonzales      So I was taking Wellbutrin for a while but it just stopped working so I quit taking it and I seem to do fine most days but other days are so hard. I'm overwhelmed, tired to no end and just tired mentally and physically. No I am not having thought of hurting myself or others, but I think I need something. I feel like when I'm getting ready to have my period it's so much worse but I just need help. Do you happen to have any appointments soon??     Terry

## 2021-12-06 NOTE — Telephone Encounter (Signed)
Rachel [name] called after reading her ultrasound results. Pt states she has never had an abnormal ultrasound and asks if Dr Laurence Compton or RN can review her result and call back to discuss.

## 2021-12-08 NOTE — Telephone Encounter (Signed)
I notified Rachel [name] of her pelvic ultrasound results.  All her questions were answered.

## 2021-12-20 ENCOUNTER — Ambulatory Visit: Admit: 2021-12-20 | Discharge: 2021-12-20 | Attending: MD | Primary: MD

## 2021-12-20 DIAGNOSIS — N926 Irregular menstruation, unspecified: Secondary | ICD-10-CM

## 2021-12-20 MED ORDER — modafiniL (PROVIGIL) 100 mg tablet
100 | ORAL_TABLET | Freq: Every day | ORAL | 2 refills | Status: DC
Start: 2021-12-20 — End: 2022-11-20

## 2021-12-20 NOTE — Progress Notes (Signed)
OFFICE VISIT    HPI  Rachel [name] is a 36 y.o. female who presents to discuss    Mood  Irregular periods  Menorrhagia  S/p tubal ligation with last baby on 12/13/20. She has irregular periods that are quite heavy and regular cramping. Scheduled for evaluation for hysterectomy.  Feels exhausted. Has been on swing shift, transitioning to days, previously on nights. While on nights was taking 200+ mg caffeine to function.   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   Plans to shift to days and husband who is also RN shifting to opposite days so kids will be home with one parent every day, starting in mid Nov.    No outpatient medications have been marked as taking for the 12/20/21 encounter (Office Visit) with Veleta Miners, MD.       Allergies and past medical, family, and social history were reviewed and updated as needed.    ROS  Negative for visual changes, eye pain, ear pain, headache, dizziness, chest pain, SOB, abdominal pain, N/V, constipation, diarrhea, joint pain, swelling, or rashes other than or in addition to what is discussed above.    Physical Exam:  BP 104/66 (BP Location: Left arm, Patient Position: Sitting)   Pulse 80   Temp 36.8 ?C (98.2 ?F) (Temporal)   Resp 18   Ht 5' 5 (1.651 m)   Wt 164 lb 11.2 oz (74.7 kg)   SpO2 97%   BMI 27.41 kg/m?     Wt Readings from Last 5 Encounters:   12/20/21 164 lb 11.2 oz (74.7 kg)   06/29/21 177 lb (80.3 kg)   02/24/21 180 lb 3.2 oz (81.7 kg)   02/02/21 179 lb 3.2 oz (81.3 kg)   01/25/21 165 lb (74.8 kg)       Gen: nontoxic appearing, no acute distress, on room air  Lungs: normal work of breathing on room air  Neuro: AOx4, CNII-XII grossly intact, moves all extremities, otherwise nonfocal  Psych: affect appropriate, mood congruent    Assessment and Plan  1. Irregular periods/menstrual cycles  2. History of tubal ligation  3. Menorrhagia with irregular cycle  F/u as scheduled with Dr. Laurence Compton.     4. Shift work sleep  disorder  Agree that she is exhausted more than depressed. This is temporary and she has plans for changing her schedule to better support her parenting. Work on tapering down on caffeine slowly as agree this is increasing anxiety. Trial of Provigil to support transitioning shifts in the interim.   -     modafiniL (PROVIGIL) 100 mg tablet; Take 1 tablet by mouth once daily.  Dispense: 30 tablet; Refill: 2         Veleta Miners, MD

## 2021-12-20 NOTE — Progress Notes (Signed)
Rachel [name] is a 36 y.o. female who presents today for follow up on medication. She was on Wellbutrin but she thinks it stopped working for her.     Vasilios Ottaway, LPN      BP: 409/81 / Pulse: 80 / SpO2: 97 % / Temp: 36.8 ?C (98.2 ?F)    Screening:   Depression: PHQ-9: 17 Severe Depression (12/20/2021)  Anxiety: GAD-7: 10  (12/20/2021)  Substance Use: EtOH - EtOH SBIRT Negative (12/20/2021)  Drug - Drug SBIRT Negative (12/20/2021)  Fall Screen: Fall Risk Screen: N/A  Cognitive Assessment:   Patient does not require a PEG score Hearing/Vision Screening: No results found.    SDOH:  Food insecurity: low risk (12/20/2021)  Transportation needs: none (12/20/2021)  Housing stability: low risk (12/20/2021)    Health Maintenance:  Health Maintenance Due   Topic Date Due   . Influenza Vaccine (1) 11/11/2021   . Tetanus Vaccine (2 - Td or Tdap) 01/05/2020   . COVID-19 Vaccine (2 - 2023-24 season) 11/11/2021       MEDICATIONS ADMINISTERED-LAST 24 Hours  (last 24 hrs)         ** No medications to display **          Ambulatory Nursing Form

## 2021-12-20 NOTE — Telephone Encounter (Signed)
Medication Prior Authorization Required:   Medication: Modafinil   Insurance: ESI   Diagnosis Code:  Completed Via:( CoverMyMeds, Phone or Fax): CMM    Response:Approved        Office notes used for prior authorization:

## 2021-12-21 NOTE — Telephone Encounter (Signed)
Twinkle callRaeghano ask if there was a sooner appt available with Dr Barton than Rachel [name] 01/05/22 appt for EMB. Rainelle thiGowrishe may have BV. Did have symptoms of itching, pain, increased discharge but now just odor. She is asking if Dr Rachel [name] can call in a RX for flagyl for her.    I let pt know I would send a message to ask about the RX or if we need to schedule a nurse visit.  Pt states understanding.

## 2021-12-21 NOTE — Telephone Encounter (Signed)
Verbal order from Dr. Laurence Compton to send flagyl prescription. Pt notified via mychart.

## 2021-12-22 MED ORDER — metroNIDAZOLE (FLAGYL) 500 mg tablet
500 | ORAL_TABLET | Freq: Two times a day (BID) | ORAL | 0 refills | 30.00000 days | Status: AC
Start: 2021-12-22 — End: 2021-12-28

## 2021-12-27 ENCOUNTER — Encounter: Attending: Student in an Organized Health Care Education/Training Program | Primary: MD

## 2021-12-29 IMAGING — MG MM DIGITAL SCREENING BILAT W/ TOMO AND CAD
8 series · 9 of 24 positions shown · non-contrast
Comparison: None.

CLINICAL DATA: Screening.

EXAM:
DIGITAL SCREENING BILATERAL MAMMOGRAM WITH TOMOSYNTHESIS AND CAD
TECHNIQUE: Bilateral screening digital craniocaudal and mediolateral oblique
mammograms were obtained. Bilateral screening digital breast
tomosynthesis was performed. The images were evaluated with
computer-aided detection.

[L CC synth-2D]
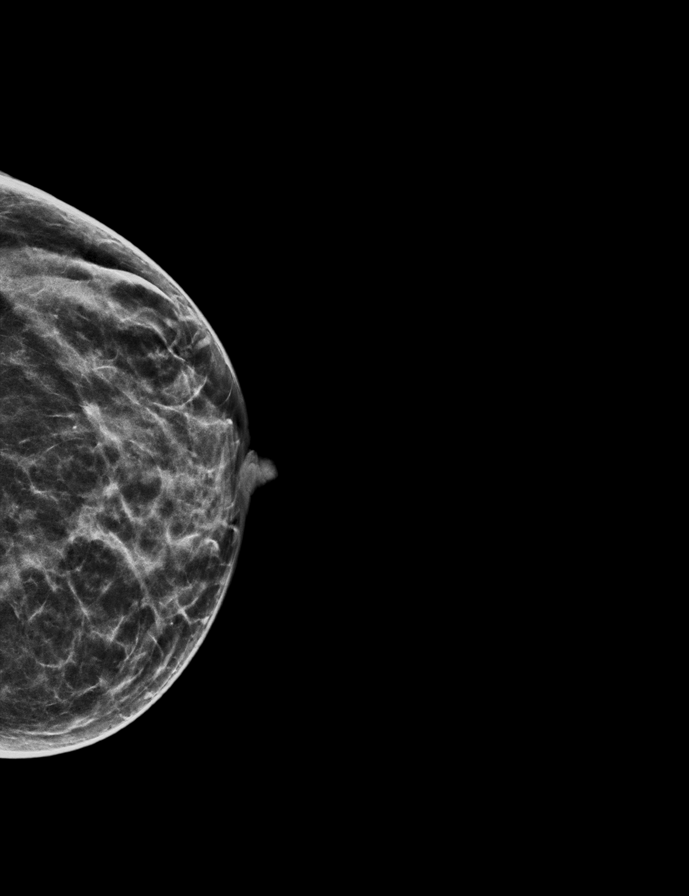

[L MLO synth-2D]
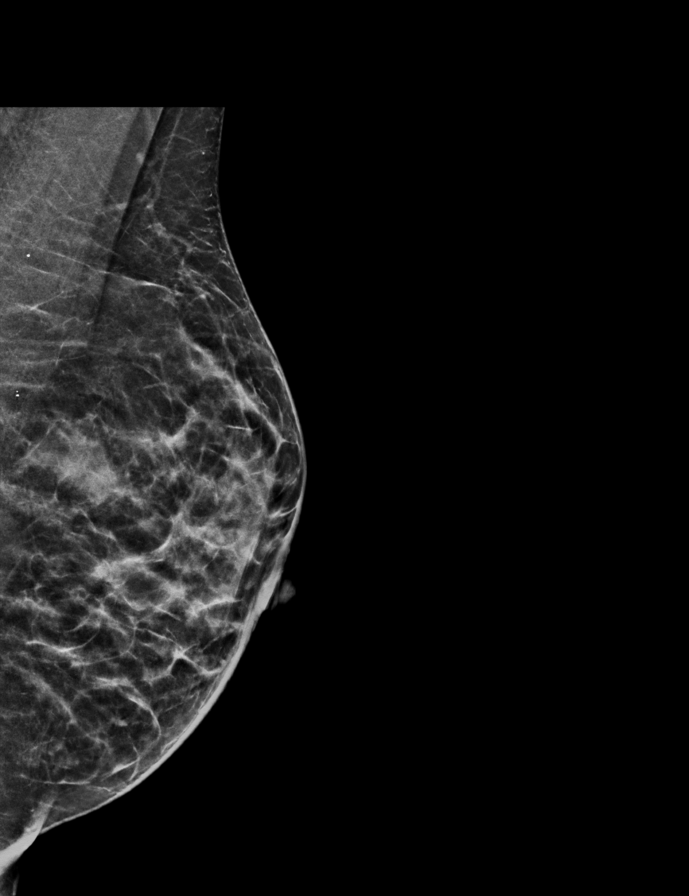

[R MLO synth-2D]
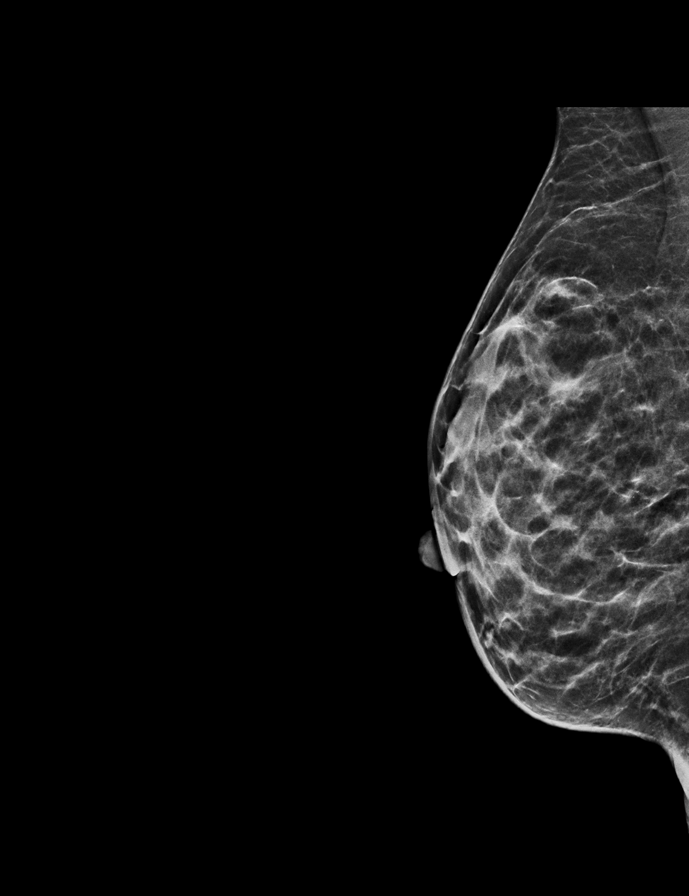

[R CC synth-2D]
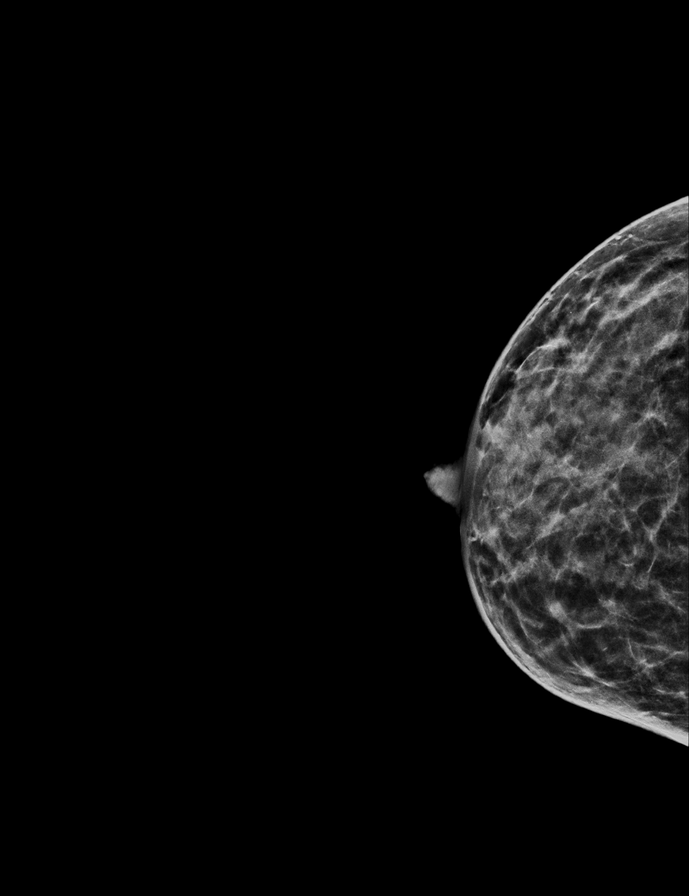

[L MLO tomo · 2 of 47 frames shown]
[frame 16/47]
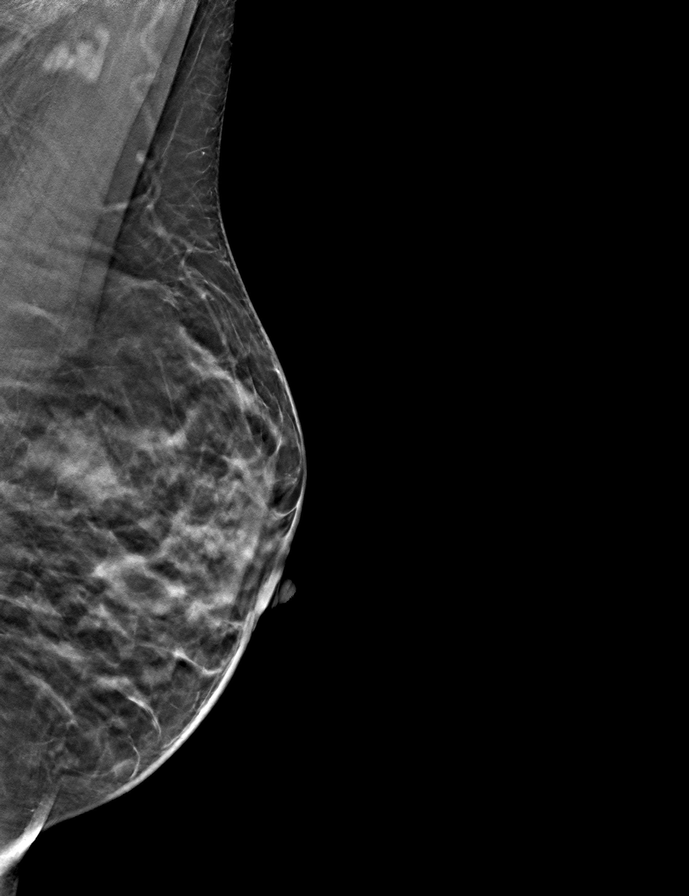
[frame 24/47]
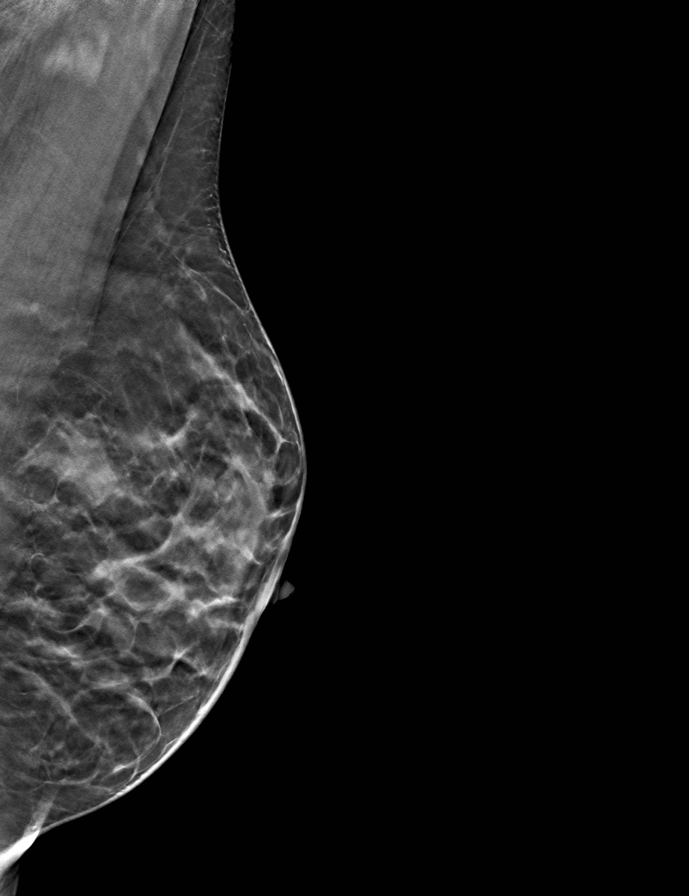

[R CC tomo · tomo slice 21/40.0]
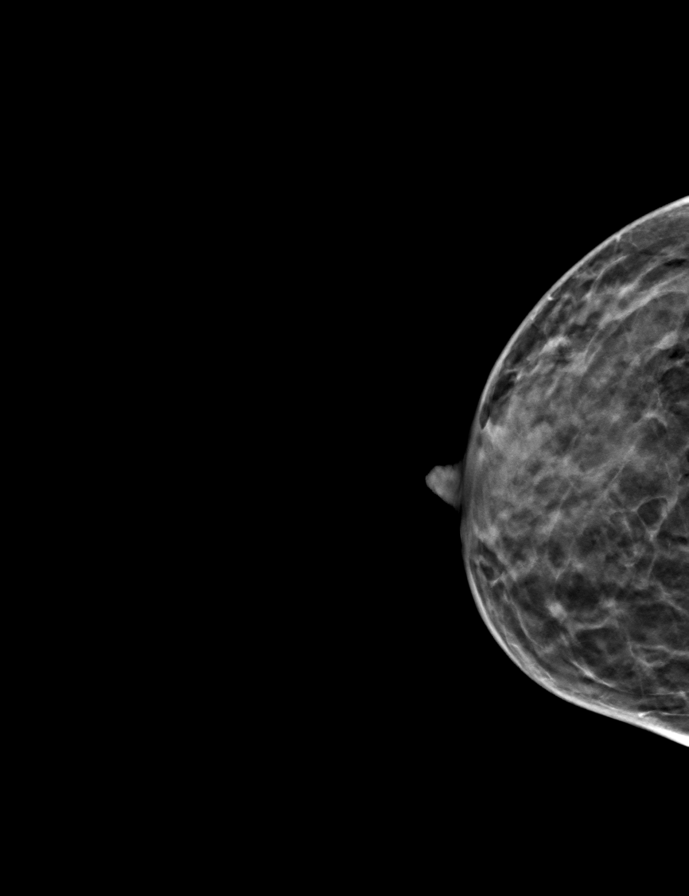

[L CC tomo · tomo slice 25/49.0]
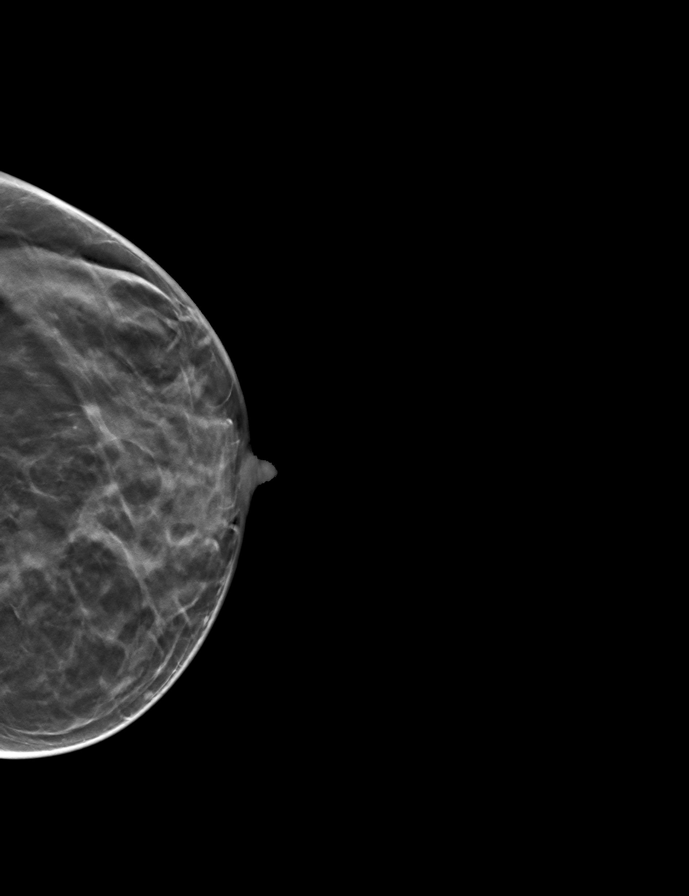

[R MLO tomo · tomo slice 21/40.0]
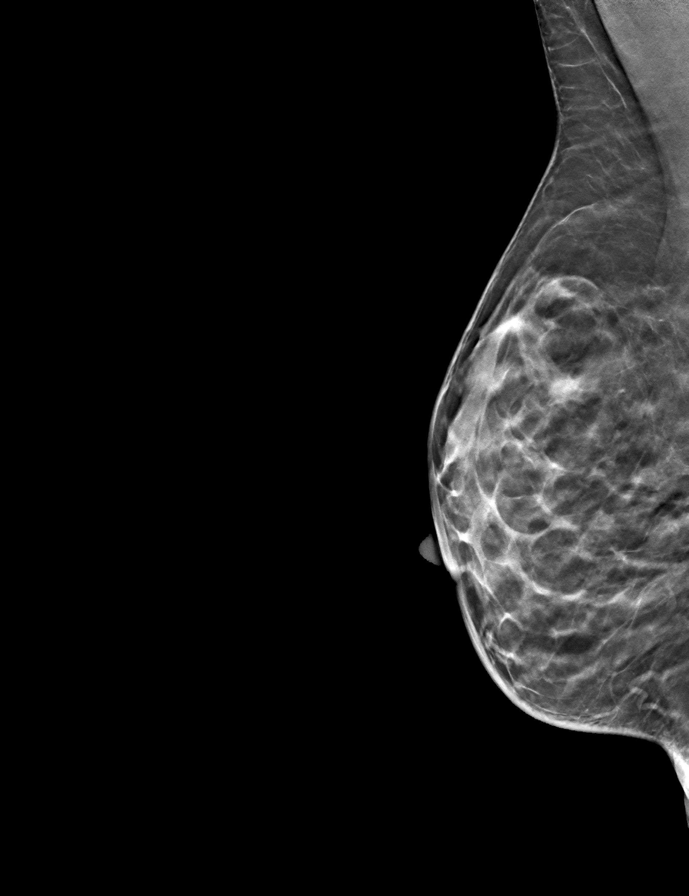

[9 of 24 positions shown; findings below may reference images not displayed]

ACR Breast Density Category c: The breast tissue is heterogeneously
dense, which may obscure small masses
FINDINGS: There are no findings suspicious for malignancy. The images were
evaluated with computer-aided detection.
IMPRESSION: No mammographic evidence of malignancy. A result letter of this
screening mammogram will be mailed directly to the patient.

RECOMMENDATION:
1.  Screening mammogram at age 40. (Code:[id number])

2. Breast cancer risk assessment given family history. If patient is
at elevated lifetime risk (greater than 20%) recommend annual
screening breast MRI in addition to annual screening mammography.

BI-RADS CATEGORY  1: Negative.

## 2021-12-29 NOTE — Telephone Encounter (Signed)
-----   Message from [place]. [place] sent at 12/29/2021  2:44 PM PDT -----  Regarding: Ultrasound  Contact: [phone]  Dr.Barton    I was wondering if you could possibly order a follow up ultrasound to have that spot looked at again since it said to follow up in 6 weeks for another ultrasound and I  about 4 weeks out. Thank you so much.     Shanda Bumps

## 2021-12-30 ENCOUNTER — Encounter: Attending: Student in an Organized Health Care Education/Training Program | Primary: MD

## 2021-12-30 NOTE — Telephone Encounter (Signed)
Verbal order from Dr. Laurence Compton to place ultrasound order. Pt notified via mychart.

## 2022-01-02 ENCOUNTER — Inpatient Hospital Stay: Admit: 2022-01-02 | Discharge: 2022-01-30 | Attending: MD | Primary: MD

## 2022-01-02 DIAGNOSIS — R102 Pelvic and perineal pain: Secondary | ICD-10-CM

## 2022-01-05 ENCOUNTER — Ambulatory Visit: Admit: 2022-01-05 | Discharge: 2022-01-05 | Attending: MD | Primary: MD

## 2022-01-05 ENCOUNTER — Other Ambulatory Visit: Admit: 2022-01-06 | Primary: MD

## 2022-01-05 DIAGNOSIS — N939 Abnormal uterine and vaginal bleeding, unspecified: Secondary | ICD-10-CM

## 2022-01-05 LAB — TISSUE EXAM

## 2022-01-05 NOTE — Progress Notes (Signed)
Rachel [name] is a 36 y.o. female, DOB 04-13-1985, who presents today for evaluation of abnormal uterine bleeding and chronic pelvic pain.      History of Present Illness  HPI   Rachel [name] presents for evaluation of abnormal uterine bleeding and chronic pelvic pain.  Her menstrual cycles occur every 25-35 days.  She use to bleed 4-5 days each month, but her cycles have progressively become heavier over the past year.  She now bleeds for 7-10 days, most of which are heavy with passing several clots.  She has even bled through her tampons and pads causing her to be embarrassed.  Her biggest concern is a worsening pelvic pain over the past 5 years.  The pain is mainly located in the mid-lower pelvic and LLQ regions.  She describes the pain as achy and crampy, and sometimes sharp.  She occasionally gets a stabbing pain in the LLQ.  She has struggled with cysts and scar tissue with her left ovary.  The pain use to be intermittent, but now it is constant.  She had a pelvic ultrasound on 01/02/22 which was normal.  She has tried birth control pills for more than 6 months in the past, but her symptoms continued to get worse.  Her symptoms are now interfering with her daily activities.  When she is bleeding, she has no quality of life.  She would like to get a hysterectomy, and have her left ovary removed.    Review of Systems     Constitutional: ?Denies fevers or chills  Neurologic: ?Denies headaches or neuropathy  CV: ?Denies chest pain or palpitations  PULM: ?Denies shortness of breath or cough  GI: ?Denies diarrhea or constipation  GU: ?Denies dysuria or hematuria  Ext: ?Denies any musculoskeletal issues    Vitals:    01/05/22 1345   BP: 124/88   Pulse: 76   Temp: 36.8 ?C (98.3 ?F)   SpO2: 98%    Body mass index is 28.62 kg/m?Marland Kitchen  Physical Exam    General: ?NAD  CV: ?RRR  Abdomen: ?Soft, nondistended?nontender  Ext: ?No calf pain or swelling  External genitalia: ?Normal appearance, no lesions  Urethral meatus: ?Normal  size and location, no lesions  Urethra: ?No masses or tenderness  Vagina: ?No abnormal discharge  Cervix: ?No discharge or lesion, no cervical motion tenderness  Uterus: ?Normal size, mobile, no tenderness  Adnexa: ?Normal size, no tenderness    Procedure Note:  Endometrial Biopsy    After Rachel [name] signed the procedure consent, she was placed in the dorsolithotomy position.  The speculum was placed, providing good visualization of the cervix.  The cervix was prepped with betadine.  The EMB pipelle was gently advanced into the uterine cavity, sounding the uterus to 8 cm.  A moderate EMB sample was obtained, and sent to pathology.  All instruments were removed from the vagina.  The patient tolerated the procedure well.    ASSESSMENT & PLAN:       ICD-10-CM    1. Abnormal uterine bleeding (AUB)  N93.9 Tissue Exam (Surgical Pathology) -Routine      2. Pelvic pain in female  R10.2         1.  Abnormal Uterine Bleeding & Chronic Pelvic Pain - After reviewing her different treatment options with their associated benefits and risks, and taking into account she has tried birth control pills for 6 months, she would like to get a hysterectomy and have her left ovary removed.  She was very tearful today  stating that I need to get my quality of life back.  I collected an endometrial biopsy today and will notify her of the results.  I will set her up for a laparoscopic hysterectomy, bilateral salpingectomy, left oophorectomy, and a cystoscopy.

## 2022-01-05 NOTE — Progress Notes (Signed)
Pt presents for endometrial biopsy. Pt wants a hysterectomy d/t  ongoing pelvic pain.     Examination chaperoned by Gretta Cool, RN.

## 2022-01-09 NOTE — Telephone Encounter (Signed)
Patient called the after hours line on 01-08-22 at3:16PM.  Dr. Kennyth Arnold was on call. The message states that she had a biopsy on Thursday 10/26 and she is having heavy bleeding and pain. jh

## 2022-01-09 NOTE — Telephone Encounter (Signed)
Follow up mychart sent to pt

## 2022-01-10 NOTE — Telephone Encounter (Signed)
Called pt to notify of results below. Pt verbalized understanding.

## 2022-01-10 NOTE — Telephone Encounter (Signed)
Refer to Biopsy results notes.

## 2022-01-10 NOTE — Telephone Encounter (Signed)
-----   Message from [place]. Frediani sent at 01/10/2022  8:42 AM PDT -----  Regarding: Follow Up  Contact: [phone]  Hey!!     Im doing better. Bleeding is better. I seen the biopsy results are back, are they good?? I don't know how to read that stuff lol..    If they are good, should I wear shortly about dates for surgery??    Rachel [name]

## 2022-01-10 NOTE — Telephone Encounter (Signed)
-----   Message from Conni Elliotton, MD sent at 01/10/2022  1:33 PM PDT -----  Please notify Rachel [name] that her endometrial biopsy results were normal.

## 2022-01-10 NOTE — Telephone Encounter (Signed)
Called patient to confirm appt. Patient didn't answer so I left a VM.

## 2022-01-11 ENCOUNTER — Ambulatory Visit: Admit: 2022-01-11 | Discharge: 2022-01-11 | Attending: FNP-C | Primary: MD

## 2022-01-11 ENCOUNTER — Encounter: Attending: MD | Primary: MD

## 2022-01-11 DIAGNOSIS — R002 Palpitations: Secondary | ICD-10-CM

## 2022-01-11 NOTE — Progress Notes (Signed)
Patient presenting today for Cardiac Management (Patient presenting today for 6 month follow up, No cardiac complaints. )  .     Vitals:    01/11/22 0948   BP: 132/68   BP Location: Right arm   Patient Position: Sitting   Pulse: 71   Resp: 18   SpO2: 100%   Weight: 175 lb (79.4 kg)   Height: 5' 5 (1.651 m)       HOME OXYGEN:  Assistive Devices Used at Home  Home Oxygen: No    Alcohol Use: Not At Risk (06/29/2021)    AUDIT-C    . Frequency of Alcohol Consumption: Never    . Average Number of Drinks: Patient does not drink    . Frequency of Binge Drinking: Never        reports no history of drug use.     Tobacco Use: Medium Risk (01/11/2022)    Patient History    . Smoking Tobacco Use: Former    . Smokeless Tobacco Use: Never    . Passive Exposure: Not on file       EXERCISE:  Do you currently exercise?: (!) No       EKG Done  EKG ordered: Yes  EKG given to: Evelene Croon FNP  EKG performed by: Judeth Cornfield      Electrocardiogram was performed with this visit.  Please see separate EKG document in ECG tab for interpretation.

## 2022-01-11 NOTE — Progress Notes (Signed)
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 Patient has been for the most part asymptomatic since her previous visit in the cardiology clinic on 06/29/2021 by Lucendia Herrlich, FNP. She does have occasional episodes of palpitations, but states overall she is feeling quite well. She does tell me she had been feeling quite fatigued, but attributes this to being a mother of 4, as well as working night shift. She states she recently made the switch to day shift, and her and her husband will alternate their shifts to avoid any daycare which she is excited about. Patient has recently started taking modafinil to combat her fatigue and now feels like a different person.     LAST ECHOCARDIOGRAM:  DATE OF STUDY:  12/07/2020     ?  INTERPRETATION:     1.  The left ventricular cavity is normal sized.  There is normal wall thickness and normal contractility.  There are no regional wall motion abnormalities.  Ejection fraction planimetried at 62%, which correlates with the visual estimate.  Diastolic   properties are normal.  2.  The left atrium is normal in size and contracts normally.  3.  The right atrium is also of normal dimensions.  Interatrial septum is intact.  4.  The right ventricle is of normal dimensions with normal systolic function.  5.  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  Is it structurally unremarkable.  6.  The mitral annulus and mitral leaflets appear normal.  The valve is competent.    7.  The tricuspid valve is structurally normal with trivial tricuspid leak.    8.  The pulmonic valve appears normal.  Pulmonary pressures are not measured.  9.  The pericardium is intact with no effusion.    10.  The aortic root and ascending  aorta are normal size.  Rhythm is sinus at a rate of 80 per minute.  11.  The pulmonary artery is not seen.  12.  The inferior vena cava appears normal in size and is collapsing with inspiration, implying normal right atrial pressures.  ?  IMPRESSION:  Structurally and functionally normal heart.      LAST HOLTER MONITOR:  Test Date:    2020-12-09    1. This 24hr Holter Monitor was obtained 9/29-30/22 (monitored for 22 hours  51 minutes 21 seconds) for the indication of premature ventricular  contractions and palpitations.  2. The patient was noted to be in Sinus Rhythm throughout the monitoring  period with an average HR of 93 bpm normal sinus rhythm and the maximum HR of  162 bpm sinus tachycardia and minimum HR of 67 bpm normal sinus rhythm.   3. The patient had rare to occasional PVC's representing approximately 1% of  all QRS complexes or approximately 1,000 PVC's which were mainly accelerated  idioventricular rhythm.  The computer generated analysis markedly  over-estimated the number of PVC's.  The PVC's revealed mainly episodes of  accelerated idioventricular rhythm (AIVR) with 5 to 16 beats with VR 99 to  112 bpm with 10 episodes.  There were approximately 5 couplets and the  remainder of the PVC's were isolated  beats.   4. The patient had no definite PAC's noted.  5. The patient had 7 symptom entries in her diary.  The symptoms and  correlating  rhythm/rate are as follows:  flutters or flutter x 3  revealed sinus tachycardia 100 bpm without any ectopics; sinus rhythm 94 bpm  with one PVC and sinus tachycardia 104 bpm with no ectopics; flutters and  dizziness revealed sinus tachycardia 131 bpm and no ectopics; chest pain  revealed sinus tachycardia 102 bpm and no ectopics; chest pain and pressure  revealed sinus tachycardia 109 bpm and no ectopics; and dizziness revealed  sinus tachycardia 143 bpm and no ectopics.  6. There were no significant pauses or AV Block.  7. There were no Significant ST  segment deviations seen.  ?  CONCLUSION:  This 24 hour Holter monitor reveals the patient to be in sinus  rhythm throughout the monitoring period with occasional PVC's mainly as  accelerated idioventricular rhythm episodes with a total of approximately  1000 PVC's and approximately 10 episodesof AIVR of 5 to 16 beats with VR 99  to 112 bpm along with 5 couplets and the remainder of the PVC's were isolated  beats.  There were no obvious PAC's.  The patient's symptoms as noted above  did not correlate clearly with the PVC's.      Medications at Presentation:   Home Medications             modafiniL (PROVIGIL) 100 mg tablet     Take 1 tablet by mouth once daily.           Allergies/Intolerances:   Allergies   Allergen Reactions   . Adhesive Hives     Paper tape   . Latex Hives and Rash   . Sulfa (Sulfonamide Antibiotics) Hives and Rash   . Morphine Other (See Comments)     Headache          General: Negative for fever, fatigue/weakness, weight loss and chills.    Cardiovascular: Negative for chest pain at rest, chest pain with exercise, palpitations, peripheral edema, PND, orthopnea, shortness of breath, dyspnea on exertion, syncope, claudication and orthostatic symptoms.    Neuro: Negative for dizziness, frequent falls, difficulty walking and prior CVA.    Respiratory: Negative for cough, dyspnea at rest, wheezing, shortness of breath and history of sleep apnea.    Gastrointestinal: Negative for melena and hematochezia.    Blood/Lymph: Negative for easy bruising, anemia, bleeding disorder, swollen glands and chills/sweats.      There were no vitals filed for this visit.     Physical Exam  Vitals and nursing note reviewed.   Constitutional:       General: She is not in acute distress.     Appearance: Normal appearance. She is normal weight. She is not ill-appearing or toxic-appearing.   HENT:      Head: Normocephalic and atraumatic.      Nose: Nose normal.      Mouth/Throat:      Mouth: Mucous membranes are moist.    Eyes:      Extraocular Movements: Extraocular movements intact.      Pupils: Pupils are equal, round, and reactive to light.   Neck:      Vascular: No carotid bruit.   Cardiovascular:      Rate and Rhythm: Normal rate and regular rhythm.      Pulses: Normal pulses.      Heart sounds: Normal heart sounds. No murmur heard.  No friction rub. No gallop.   Pulmonary:      Effort: Pulmonary effort is normal. No respiratory distress.      Breath sounds: Normal breath sounds. No wheezing, rhonchi or rales.   Musculoskeletal:      Cervical back: Neck supple.      Right lower leg: No edema.      Left lower leg: No edema.   Lymphadenopathy:      Cervical: No cervical adenopathy.   Skin:     General: Skin is warm and dry.      Capillary Refill: Capillary refill takes less than 2 seconds.   Neurological:      General: No focal deficit present.      Mental Status: She is alert and oriented to person, place, and time. Mental status is at baseline.      Cranial Nerves: No cranial nerve deficit.   Psychiatric:         Mood and Affect: Mood normal.        Clinic EKG Preliminary Report: Normal Sinus Rhythm, rate 65.    Wt Readings from Last 3 Encounters:   01/05/22 172 lb (78 kg)   12/20/21 164 lb 11.2 oz (74.7 kg)   06/29/21 177 lb (80.3 kg)      BP Readings from Last 3 Encounters:   01/05/22 124/88   12/20/21 104/66   06/29/21 110/70      Lab Results   Component Value Date    CHOL 149 02/22/2021    CHOL 168 02/16/2018    CHOL 186 01/16/2017      Lab Results   Component Value Date    HDL 50 (L) 02/22/2021    HDL 60 02/16/2018    HDL 62 01/16/2017     Lab Results   Component Value Date    LDLCALC 87 02/22/2021    LDLCALC 90 02/16/2018    LDLCALC 100 01/16/2017      Lab Results   Component Value Date    TRIG 60.0 02/22/2021    TRIG 91.0 02/16/2018    TRIG 121.0 01/16/2017            CARDIOLOGY ASSESSMENT:  1.  Palpitations    PLAN:  1. Essentially resolved.    Overall, Rachel [name] is doing well from a cardiology standpoint. She is  currently not on any beta blockade. Patient to call the clinic with any increase in symptoms, problems, questions, or concerns. Otherwise, she will follow up in the cardiology clinic in approximately 1 years time with Dr. Allena Katz. Patient is agreeable with the plan above.       Current Medications:   Home Medications             modafiniL (PROVIGIL) 100 mg tablet     Take 1 tablet by mouth once daily.         Marcia Brash     This dictation was produced using Furniture conservator/restorer.  Despite concurrent proofreading, please note that transcription errors are common and may not reflect the true intent of the examiner.

## 2022-01-17 NOTE — Telephone Encounter (Signed)
Rachel [name] callWaltao check on the status of her Prior Auth to schedule her procedure.  I let Rachel [name] know that I did not see the status in the notes and that I would send Dr Rachel [name] and staff a note that she was asking about updates on scheduling.

## 2022-01-18 NOTE — Telephone Encounter (Signed)
Patient called again because she had called her insurance company and they said they have not received any paperwork from our office and that they do not need an authorization.  The surgery schedulerer is out until Monday, so will check with her on Monday. jh

## 2022-01-19 ENCOUNTER — Emergency Department: Admit: 2022-01-20 | Primary: MD

## 2022-01-19 DIAGNOSIS — R1032 Left lower quadrant pain: Secondary | ICD-10-CM

## 2022-01-19 MED ORDER — HYDROcodone-acetaminophen (NORCO) 5-325 mg per tablet
5-325 | ORAL_TABLET | Freq: Four times a day (QID) | ORAL | 0 refills | 30.00000 days | Status: DC | PRN
Start: 2022-01-19 — End: 2022-03-08

## 2022-01-19 NOTE — Progress Notes (Signed)
I sent in a prescription for Norco (20 tablets) to help Rachel [name] with her pelvic pain.  She is in the process of getting set up for a hysterectomy.
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
## 2022-01-19 NOTE — ED Notes (Signed)
NP at bedside.

## 2022-01-19 NOTE — Telephone Encounter (Addendum)
Pt calls and states she is having moderate to severe LLQ pain that radiates to her back and groin area. Pt states she has tried tylenol, ibuprofen, lidocaine patch and is still having pain. Pt is wondering if she can get a script for pain medication.     CMA spoke with Dr. Laurence Compton, [facility] script printed, pt informed that she will need to pick it up. Also informed pt that if pain worsens she should go to ER for evaluation. At this time Dr. Laurence Compton does not think the patient needs a CT, will not change plan of care but if she goes to ER and would like one, she is welcomed to. Pt verbalizes understanding.

## 2022-01-19 NOTE — Discharge Instructions (Addendum)
Follow-up with Dr. Laurence Compton for reevaluation of your pain in approximately 4 days if symptoms or not improving sooner if they worsen.

## 2022-01-19 NOTE — ED Triage Notes (Signed)
Severe flank and back pain starting last night, worse today. Has tried lidocaine patch, Norco, tylenol, ibuprofen with no relief. Hx endometriosis

## 2022-01-19 NOTE — ED Notes (Signed)
Bed: 526-01  Expected date:   Expected time:   Means of arrival: Car (Private Vehicle)  Comments:

## 2022-01-20 ENCOUNTER — Inpatient Hospital Stay: Admit: 2022-01-20 | Discharge: 2022-01-20 | Disposition: A | Discharge status: Home / Self Care | Attending: Family

## 2022-01-20 LAB — URINALYSIS WITH MICROSCOPIC, CULTURE IF INDICATED
Ascorbic Acid, Urine: NEGATIVE
Bacteria, Urine: NONE SEEN /HPF
Bilirubin, Urine: NEGATIVE
Blood, Urine: NEGATIVE
Glucose, Urine: NEGATIVE mg/dL
Ketones, Urine: NEGATIVE mg/dL
Leukocyte Esterase, Urine: NEGATIVE
Nitrite, Urine: NEGATIVE
Protein, Urine: NEGATIVE mg/dL
RBC, Urine: 0 /HPF (ref 0–4)
Renal/Transitional Epithelial, Urine: 0 /HPF (ref 0–2)
Specific Gravity, Urine: 1.02 (ref 1.005–1.030)
Squamous Epithelial, UA: <1 /HPF (ref 0–4)
Urobilinogen, Urine: 4 mg/dL — AB (ref <2.0)
WBC, Urine: 1 /HPF (ref 0–4)
pH, UA: 5 (ref 5.0–7.5)

## 2022-01-20 LAB — CBC WITH AUTO DIFFERENTIAL
Basophils %: 0 % (ref 0–2)
Basophils, Absolute: 0 10*3/ÂµL (ref 0.0–0.1)
Eosinophils %: 1 % (ref 0–5)
Eosinophils, Absolute: 0 10*3/ÂµL (ref 0.0–0.4)
HCT: 42.9 % (ref 35.0–48.0)
Hemoglobin: 14.5 g/dL (ref 11.7–16.5)
Lymphocytes %: 37 % (ref 20–40)
Lymphocytes, Absolute: 2.1 10*3/ÂµL (ref 1.0–4.0)
MCH: 29.4 pg (ref 28.3–33.3)
MCHC: 33.9 g/dL (ref 32.5–36.0)
MCV: 86.7 fL (ref 81.0–100.0)
MPV: 7.8 fL (ref 6.9–10.0)
Monocytes %: 9 % (ref 2–12)
Monocytes, Absolute: 0.5 10*3/ÂµL (ref 0.2–1.0)
Neutrophils %: 54 % (ref 50–74)
Neutrophils, Absolute: 3.1 10*3/ÂµL (ref 2.0–7.4)
Platelet Count: 173 10*3/ÂµL (ref 150–405)
RBC: 4.95 10*6/ÂµL (ref 3.80–5.60)
RDW: 12.8 % (ref 11.7–16.1)
WBC: 5.7 10*3/ÂµL (ref 4.5–11.0)

## 2022-01-20 LAB — COMPREHENSIVE METABOLIC PANEL
ALT - Alanine Aminotransferase: 17 IU/L (ref 5–33)
AST - Aspartate Aminotransferase: 25 IU/L (ref 5–32)
Albumin/Globulin Ratio: 1.5 (ref 1.2–2.2)
Albumin: 4.6 g/dL (ref 3.5–5.0)
Alkaline Phosphatase: 95 IU/L (ref 35–105)
Anion Gap: 9.7 mmol/L (ref 9.0–18.0)
BUN / Creatinine Ratio: 18.6 (ref 12.0–20.0)
BUN: 11 mg/dL (ref 8–20)
Bilirubin Total: 0.3 mg/dL (ref 0.10–1.70)
CO2 - Carbon Dioxide: 26 mmol/L (ref 17–27)
Calcium: 8.6 mg/dL (ref 8.6–10.6)
Chloride: 102.3 mmol/L (ref 101.0–111.0)
Creatinine: 0.59 mg/dL — ABNORMAL LOW (ref 0.60–1.30)
Glomerular Filtration Rate Estimate (Female): >90 mL/min/{1.73_m2} (ref >=60)
Glucose: 66 mg/dL — ABNORMAL LOW (ref 74–106)
Osmolality Calculation: 273.3 mOsm/kg — ABNORMAL LOW (ref 275.0–300.0)
Potassium: 3.09 mmol/L — ABNORMAL LOW (ref 3.50–5.10)
Protein Total: 7.7 g/dL (ref 6.4–8.2)
Sodium: 138 mmol/L (ref 135–145)

## 2022-01-20 LAB — WET MOUNT, SLM
Clue Cells: NONE SEEN
Spermatozoa: NONE SEEN
Trichomonas: NONE SEEN
Yeast: NONE SEEN

## 2022-01-20 LAB — CHLAMYDIA/GC PCR, URINE
Chlamydia trachomatis by PCR: NOT DETECTED
Neisseria gonorrhoeae by PCR: NOT DETECTED

## 2022-01-20 LAB — LIPASE: Lipase: 26 IU/L (ref 22–51)

## 2022-01-20 LAB — LACTIC ACID, PLASMA (SLM): Lactic Acid: 1 mmol/L (ref 0.5–1.9)

## 2022-01-20 LAB — BETA-HCG, URINE, QUALITATIVE: hCG, Qualitative: NEGATIVE

## 2022-01-20 MED ORDER — ketorolac (TORADOL) 15 mg/mL injection 15 mg
15 | Freq: Once | INTRAMUSCULAR | Status: AC
Start: 2022-01-20 — End: 2022-01-19
  Administered 2022-01-20: 05:00:00 15 mg via INTRAVENOUS

## 2022-01-20 MED ORDER — ondansetron (ZOFRAN) 4 mg tablet
4 | ORAL_TABLET | Freq: Four times a day (QID) | ORAL | 0 refills | Status: DC | PRN
Start: 2022-01-20 — End: 2022-03-08

## 2022-01-20 MED ORDER — HYDROmorphone (DILAUDID) injection 0.5 mg
0.5 | INTRAMUSCULAR | Status: DC | PRN
Start: 2022-01-20 — End: 2022-01-19
  Administered 2022-01-20: 04:00:00 0.5 mg via INTRAVENOUS

## 2022-01-20 MED ORDER — ondansetron (ZOFRAN) injection 4 mg
4 | INTRAMUSCULAR | Status: DC | PRN
Start: 2022-01-20 — End: 2022-01-19
  Administered 2022-01-20: 04:00:00 4 mg via INTRAVENOUS

## 2022-01-23 NOTE — Telephone Encounter (Signed)
Patient is called back.  She states she is frustrated that it has taken this long to get prior auth sent.  Informed that because she has [facility], she most likely will not need authorization but this is the process.  Informed that, however, surgeries are scheduling into January but we do not have dates yet.  She starts to cry and stating that she has been having a lot of pain flare ups and was in the ER last week.  She has been taking ibuprofen, using lidocaine patches and norco and the only thing that helps is the norco.  She is frustrated because she has dealt with this for so long.    Informed that I can let her know if someone else cancels between now and January.    Patient verbalizes understanding.

## 2022-01-23 NOTE — Telephone Encounter (Signed)
Arilyn called back and asked to [place] with [facility]. I let her know Kriste Basque was not available and I would leave a message asking her to call back. Pt states understanding.

## 2022-01-23 NOTE — Telephone Encounter (Signed)
Patient- Rachel Ai[birth year]DOB- Jul 24, 1985  Surgeon- Laurence Compton  Outpatient- 23 hour observation  Diagnosis- abnormal uterine bleeding, chronic pelvic pain  ICD10 codes- N93.9, R10.2  Procedure- laparoscopic assisted vaginal hysterectomy, bilateral salpingectomy, left oophorectomy, cystoscopy  CPT codes- 16109, 52000  Include doctors chart notes from - 01/05/22  Include imaging from- 12/03/21, 01/02/22, 01/19/22  Include tissue pathology from- 01/05/22

## 2022-01-25 NOTE — Telephone Encounter (Signed)
Ready for scheduling  No authorization required

## 2022-01-31 LAB — LIPID PANEL W/ REFLEX DIRECT LDL
Chol/HDL Ratio: 2.9 (ref 0.0–5.0)
Cholesterol, HDL: 46 mg/dL — ABNORMAL LOW (ref >=60)
Cholesterol: 134 mg/dL (ref <=200)
LDL Calculated: 79 mg/dL (ref 0–129)
Triglyceride: 46 mg/dL (ref <=150.0)
VLDL Cholesterol Calculation: 9.2 mg/dL (ref 7.0–32.0)

## 2022-01-31 LAB — GLYCO-HEMOGLOBIN A1C: Glycohemoglobin (A1c): 4.9 % (ref <=5.6)

## 2022-01-31 NOTE — Other (Signed)
Labs drawn as part of [employer] Wellness Incentive enrollment.  They are essentially normal, and no changes are likely needed.  I am just making sure you are aware!    The ASCVD Risk score (Arnett DK, et al., 2019) failed to calculate.

## 2022-02-14 NOTE — Telephone Encounter (Signed)
Tried to call patient.  Message left to call clinic.

## 2022-02-14 NOTE — Telephone Encounter (Addendum)
Urgent cardiac clearance request received from Dr. Laurence Compton for the patient to undergo laparoscopic assisted vaginal hysterectomy, bilateral salpingectomy, left oophorectomy.  The patient is being scheduled on January 9 however there is a possibility of her moving forward with surgery on December 11.  To Christina Harvey-Brunswick, FNP-C for review.  Cristle Jared, RN, BSN    Future Appointments   Date Time Provider [place]  03/09/2022  3:00 PM [id number]   01/19/2023  9:45 AM Joslyn Hy, MD [facility]     Current Outpatient Medications   Medication Instructions   . HYDROcodone-acetaminophen (NORCO) 5-325 mg per tablet 1-2 tablets, Oral, Every 6 hours PRN   . metFORMIN (GLUCOPHAGE) 500 mg tablet No dose, route, or frequency recorded.   . modafiniL (PROVIGIL) 100 mg, Oral, Daily   . ondansetron (ZOFRAN) 4 mg, Oral, Every 6 hours PRN

## 2022-02-14 NOTE — Telephone Encounter (Signed)
Patient returns call.  Medication list is updated.    She is aware that I am getting her scheduled for 03/21/22 but there is a chance we can do this on this coming Monday.  She is ok with that.    She is aware that cardiac clearance form was faxed to heart clinic but should not be a problem as she was just recently seen.  She also has had recent blood work and EKG.    Will let her know by tomorrow if we can move to Monday.

## 2022-02-15 NOTE — Telephone Encounter (Signed)
Called pt to notify her about her FMLA paperwork being complete and ready for pick up as there is still some pages that need her signature. Pt states understanding. Will come pick up paperwork today.

## 2022-02-15 NOTE — Telephone Encounter (Signed)
Patient is called and informed that we can move her surgery to 12/11.  She is made aware that her approximate check in time is 11:00 but possible they may have her check in earlier that day.  She is aware that she will spend the night and go home the next day, and will need someone to drive her home.    She is aware that she will need to take 6 weeks off work.  No lifting more than 10 lbs for 6-8 weeks.     No submersion in water for 6-8 weeks and nothing in vagina for 6-8 weeks.      Should not need any more labs or EKG as has had recent ones.    Go cardiac clearance back today.    She is aware she will have a pre op phone call this week and will make her a 2 weeks post op visit with Dr. Laurence Compton to check vaginal stitches.     Will send all details via Mychart when have everything scheduled.    Encouraged to call if has any questions.  Patient verbalizes understanding.

## 2022-02-15 NOTE — Telephone Encounter (Signed)
Per Sanjuana Lettersick, FNP-C the patient is appropriately optimized for the planned surgery.  Signed form faxed to the preop clinic [phone].  Also faxed to DPatterson Hammersmithrton's office at [phone].  [place], Charity fundraiser, BSN

## 2022-02-15 NOTE — Procedures (Signed)
[facility] PRE-OP INSTRUCTIONS    Dear Rachel [name],  It was a pleasure to talk to you today.    Please have your labs  done several days prior to your scheduled surgery. Go to outpatient registration at the front of the hospital.     Date of Surgery: Monday December 11 with Dr. Laurence Compton    Your Surgeon's office will contact you with your arrival time the working day prior to your procedure.    Arrive at the Surgery Entrance on [address], next to the [facility]. If you do not have a family or friend waiting for you, please have a good working phone number so your discharge nurse may call your ride when you are ready to leave.      Do not EAT anything after midnight the night prior to your surgery.    ? Your anesthesiologist will allow you to have clear liquids only (liquids such as water, black coffee, tea, Gatorade, apple juice, cranberry juice, or coconut water up to 2 hours before your scheduled arrival time. Avoid orange juice, and other pulpy juices, energy drinks, alcohol, protein drinks, and coffee with dairy products.      If you develop a fever/chills, cough, shortness of breath, excessive fatigue, muscle and joint pain, headaches, loss of taste/smell, nasal congestion, nausea/vomiting, or diarrhea contact your primary care physician or surgeon immediately.    Before leaving for the hospital:    . Take a soapy shower or bath and brush teeth.  . Please do not wear eye make-up.  Marland Kitchen Please wear NO jewelry, no watches, no rings, or necklaces, and remove all piercings.   . Please wear casual, non-restrictive clothing.  . Do not bring credit cards, cash, medication, jewelry, weapons, or other valuables.    What To Bring With You    ? If you will be staying at least one night   please have your family bring your overnight bag once you reach your room on the post-surgical unit.  Visiting hours are from 10 am to 9 pm.    ? Please bring your glasses,denture, hearing aids and a case for  these items. Do not wear contact lenses.    Additional instructions: Dr. Iran Planas  is your anesthesiologist.  Please take all prescribed morning medications the day of your procedure with the exception of: metFORMIN (GLUCOPHAGE)    It is not necessary to take vitamins and supplements.  Please stop motrin, aspirin, ibuprofen, and aleve on Monday December 4 as directed by your surgeon.     Pre-op Clinic phone: [phone]  Angie RN    If you need to cancel day of surgery please call [phone].  Thank you

## 2022-02-16 ENCOUNTER — Encounter: Admit: 2022-02-16 | Discharge: 2022-02-16 | Primary: MD

## 2022-02-16 DIAGNOSIS — Z01812 Encounter for preprocedural laboratory examination: Secondary | ICD-10-CM

## 2022-02-17 NOTE — Telephone Encounter (Signed)
I called pt and reminded her of her procedure on 12/11 with check in time of 11:00. Pt verbalized understanding and had no questions/concerns.

## 2022-02-19 ENCOUNTER — Other Ambulatory Visit: Admit: 2022-02-19 | Primary: MD

## 2022-02-19 DIAGNOSIS — Z01812 Encounter for preprocedural laboratory examination: Secondary | ICD-10-CM

## 2022-02-19 LAB — BASIC METABOLIC PANEL
Anion Gap: 8.7 mmol/L — ABNORMAL LOW (ref 9.0–18.0)
BUN / Creatinine Ratio: 15.8 (ref 12.0–20.0)
BUN: 9 mg/dL (ref 8–20)
CO2 - Carbon Dioxide: 26 mmol/L (ref 17–27)
Calcium: 9.1 mg/dL (ref 8.6–10.6)
Chloride: 102.3 mmol/L (ref 101.0–111.0)
Creatinine: 0.57 mg/dL — ABNORMAL LOW (ref 0.60–1.30)
Glomerular Filtration Rate Estimate (Female): >90 mL/min/{1.73_m2} (ref >=60)
Glucose: 85 mg/dL (ref 74–106)
Osmolality Calculation: 271.8 mOsm/kg — ABNORMAL LOW (ref 275.0–300.0)
Potassium: 3.91 mmol/L (ref 3.50–5.10)
Sodium: 137 mmol/L (ref 135–145)
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
## 2022-02-20 LAB — TISSUE EXAM

## 2022-02-20 LAB — POCT PREGNANCY, URINE: Urine, pregnancy: NEGATIVE

## 2022-02-20 MED ORDER — EPINEPHrine HCl (PF) (ADRENALIN) injection
1 | INTRAMUSCULAR | Status: DC | PRN
Start: 2022-02-20 — End: 2022-02-20
  Administered 2022-02-20: 20:00:00 1 mg/mL ( mL)

## 2022-02-20 MED ORDER — ceFAZolin (ANCEF) 2 g in D5W IVPB Premix
2 | INTRAVENOUS | Status: AC | PRN
Start: 2022-02-20 — End: 2022-02-20

## 2022-02-20 MED ORDER — naloxone (NARCAN) injection 0.04 mg
0.4 | INTRAMUSCULAR | Status: DC | PRN
Start: 2022-02-20 — End: 2022-02-21

## 2022-02-20 MED ORDER — ibuprofen (ADVIL) tablet 800 mg
800 | Freq: Three times a day (TID) | ORAL | Status: DC | PRN
Start: 2022-02-20 — End: 2022-02-21
  Administered 2022-02-21 (×2): 800 mg via ORAL

## 2022-02-20 MED ORDER — glycopyrrolate (PF) in water (ROBINUL) 0.4 mg/2 mL (0.2 mg/mL) syringe
0.4 | INTRAVENOUS | Status: AC
Start: 2022-02-20 — End: ?

## 2022-02-20 MED ORDER — BUPivacaine (PF) (MARCAINE) 0.25 % (2.5 mg/mL) injection
0.25 | INTRAMUSCULAR | Status: AC
Start: 2022-02-20 — End: ?

## 2022-02-20 MED ORDER — ePHEDrine sulfate 50 mg/mL injection 5-10 mg
50 | INTRAVENOUS | Status: DC | PRN
Start: 2022-02-20 — End: 2022-02-20

## 2022-02-20 MED ORDER — ondansetron (ZOFRAN) injection 4 mg
4 | Freq: Four times a day (QID) | INTRAMUSCULAR | Status: DC | PRN
Start: 2022-02-20 — End: 2022-02-21

## 2022-02-20 MED ORDER — ondansetron (ZOFRAN) injection
4 | INTRAMUSCULAR | Status: DC | PRN
Start: 2022-02-20 — End: 2022-02-20

## 2022-02-20 MED ORDER — fentaNYL (PF) (SUBLIMAZE) 50 mcg/mL injection
50 | INTRAMUSCULAR | Status: AC
Start: 2022-02-20 — End: ?

## 2022-02-20 MED ORDER — diphenhydrAMINE (BENADRYL) injection 12.5-25 mg
50 | INTRAMUSCULAR | Status: DC | PRN
Start: 2022-02-20 — End: 2022-02-20

## 2022-02-20 MED ORDER — naloxone (NARCAN) injection 0.4 mg
0.4 | INTRAMUSCULAR | Status: DC | PRN
Start: 2022-02-20 — End: 2022-02-20

## 2022-02-20 MED ORDER — droPERidol (INAPSINE) 2.5 mg/mL injection
2.5 | INTRAMUSCULAR | Status: AC
Start: 2022-02-20 — End: ?

## 2022-02-20 MED ORDER — glycopyrrolate (PF) in water (ROBINUL) 1 mg/5 mL (0.2 mg/mL) syringe
1 | INTRAVENOUS | Status: DC | PRN
Start: 2022-02-20 — End: 2022-02-20
  Administered 2022-02-20: 21:00:00 1 mg/5 mL (0.2 mg/mL) via INTRAVENOUS

## 2022-02-20 MED ORDER — neostigmine methylsulfate (BLOXIVERZ) 3 mg/3 mL (1 mg/mL) injection
3 | INTRAVENOUS | Status: AC
Start: 2022-02-20 — End: ?

## 2022-02-20 MED ORDER — neostigmine methylsulfate (BLOXIVERZ) injection
3 | INTRAVENOUS | Status: DC | PRN
Start: 2022-02-20 — End: 2022-02-20
  Administered 2022-02-20: 21:00:00 3 mg/ mL (1 mg/mL) via INTRAVENOUS

## 2022-02-20 MED ORDER — dexAMETHasone (PF) (DECADRON) 10 mg/mL syringe
10 | INTRAMUSCULAR | Status: AC
Start: 2022-02-20 — End: ?

## 2022-02-20 MED ORDER — ondansetron (ZOFRAN-ODT) disintegrating tablet 4 mg
4 | Freq: Four times a day (QID) | ORAL | Status: DC | PRN
Start: 2022-02-20 — End: 2022-02-21

## 2022-02-20 MED ORDER — droPERidol (INAPSINE) injection
2.5 | INTRAMUSCULAR | Status: DC | PRN
Start: 2022-02-20 — End: 2022-02-20
  Administered 2022-02-20: 20:00:00 2.5 mg/mL via INTRAVENOUS

## 2022-02-20 MED ORDER — senna-docusate (PERICOLACE) 8.6-50 mg per tablet 1 tablet
8.6-50 | Freq: Two times a day (BID) | ORAL | Status: DC | PRN
Start: 2022-02-20 — End: 2022-02-21
  Administered 2022-02-21: 03:00:00 via ORAL

## 2022-02-20 MED ORDER — dexAMETHasone (PF) (DECADRON) syringe
10 | INTRAMUSCULAR | Status: DC | PRN
Start: 2022-02-20 — End: 2022-02-20
  Administered 2022-02-20: 20:00:00 10 mg/mL via INTRAVENOUS

## 2022-02-20 MED ORDER — propofoL (DIPRIVAN) injection
10 | INTRAVENOUS | Status: DC | PRN
Start: 2022-02-20 — End: 2022-02-20
  Administered 2022-02-20: 20:00:00 10 mg/mL via INTRAVENOUS

## 2022-02-20 MED ORDER — meperidine (PF) (DEMEROL) 25 mg/mL injection
25 | INTRAMUSCULAR | Status: AC
Start: 2022-02-20 — End: ?

## 2022-02-20 MED ORDER — propofoL (DIPRIVAN) 10 mg/mL injection
10 | INTRAVENOUS | Status: AC
Start: 2022-02-20 — End: ?

## 2022-02-20 MED ORDER — famotidine (PEPCID) tablet 20 mg
20 | Freq: Once | ORAL | Status: AC
Start: 2022-02-20 — End: 2022-02-20
  Administered 2022-02-20: 19:00:00 20 mg via ORAL

## 2022-02-20 MED ORDER — magnesium hydroxide (MILK OF MAGNESIA) 400 mg/5 mL oral suspension 30 mL
400 | Freq: Every day | ORAL | Status: DC | PRN
Start: 2022-02-20 — End: 2022-02-21

## 2022-02-20 MED ORDER — ketorolac (TORADOL) 15 mg/mL injection
15 | INTRAMUSCULAR | Status: DC | PRN
Start: 2022-02-20 — End: 2022-02-20
  Administered 2022-02-20: 21:00:00 15 mg/mL via INTRAVENOUS

## 2022-02-20 MED ORDER — aluminum-mag hydroxide-simeth 200-200-20 mg/5 mL oral suspension 30 mL
200-200-20 | ORAL | Status: DC | PRN
Start: 2022-02-20 — End: 2022-02-21
  Administered 2022-02-21: 03:00:00 200-200-20 mL via ORAL

## 2022-02-20 MED ORDER — phenylephrine HCl in 0.9% NaCl (NEO-SYNEPHRINE) 1 mg/10 mL (100 mcg/mL) injection 50-100 mcg
1 | INTRAVENOUS | Status: DC | PRN
Start: 2022-02-20 — End: 2022-02-20

## 2022-02-20 MED ORDER — lidocaine 10 mg/mL (1 %) solution
10 | INTRAMUSCULAR | Status: DC | PRN
Start: 2022-02-20 — End: 2022-02-20
  Administered 2022-02-20: 20:00:00 10 mg/mL (1 %) via INTRAVENOUS

## 2022-02-20 MED ORDER — metFORMIN (GLUCOPHAGE) tablet 500 mg
500 | Freq: Every day | ORAL | Status: DC
Start: 2022-02-20 — End: 2022-02-21
  Administered 2022-02-21: 17:00:00 500 mg via ORAL

## 2022-02-20 MED ORDER — rocuronium (ZEMURON) injection
10 | INTRAVENOUS | Status: DC | PRN
Start: 2022-02-20 — End: 2022-02-20
  Administered 2022-02-20 (×2): 10 mg/mL via INTRAVENOUS

## 2022-02-20 MED ORDER — ondansetron (ZOFRAN) injection 4 mg
4 | Freq: Four times a day (QID) | INTRAMUSCULAR | Status: DC | PRN
Start: 2022-02-20 — End: 2022-02-20

## 2022-02-20 MED ORDER — naloxone (NARCAN) injection 0.4 mg
0.4 | INTRAMUSCULAR | Status: DC | PRN
Start: 2022-02-20 — End: 2022-02-21

## 2022-02-20 MED ORDER — ceFAZolin (ANCEF) in D5W IVPB Premix
2 | INTRAVENOUS | Status: DC | PRN
Start: 2022-02-20 — End: 2022-02-20
  Administered 2022-02-20: 20:00:00 2 gram/50 mL via INTRAVENOUS

## 2022-02-20 MED ORDER — EPINEPHrine HCl (PF) (ADRENALIN) 1 mg/mL (1 mL) injection
1 | INTRAMUSCULAR | Status: AC
Start: 2022-02-20 — End: ?

## 2022-02-20 MED ORDER — ondansetron (ZOFRAN) 4 mg/2 mL injection
4 | INTRAMUSCULAR | Status: AC
Start: 2022-02-20 — End: ?

## 2022-02-20 MED ORDER — rocuronium (ZEMURON) 50 mg/5 mL (10 mg/mL) injection
50 | INTRAVENOUS | Status: AC
Start: 2022-02-20 — End: ?

## 2022-02-20 MED ORDER — midazolam (PF) (VERSED) injection 0.5-2 mg
1 | Freq: Once | INTRAMUSCULAR | Status: AC
Start: 2022-02-20 — End: 2022-02-20
  Administered 2022-02-20: 20:00:00 1 mg via INTRAVENOUS

## 2022-02-20 MED ORDER — fentaNYL (PF) (SUBLIMAZE) injection
50 | INTRAMUSCULAR | Status: DC | PRN
Start: 2022-02-20 — End: 2022-02-20
  Administered 2022-02-20 (×4): 50 mcg/mL via INTRAVENOUS

## 2022-02-20 MED ORDER — ketorolac (TORADOL) 15 mg/mL injection
15 | INTRAMUSCULAR | Status: AC
Start: 2022-02-20 — End: ?

## 2022-02-20 MED ORDER — phenylephrine HCl in 0.9% NaCl (NEO-SYNEPHRINE) 1 mg/10 mL (100 mcg/mL) injection
1 | INTRAVENOUS | Status: AC
Start: 2022-02-20 — End: ?

## 2022-02-20 MED ORDER — polyethylene glycol (MIRALAX) packet 17 g
17 | Freq: Two times a day (BID) | ORAL | Status: DC | PRN
Start: 2022-02-20 — End: 2022-02-21
  Administered 2022-02-21: 03:00:00 17 g via ORAL

## 2022-02-20 MED ORDER — lactated Ringer's infusion
INTRAVENOUS | Status: DC | PRN
Start: 2022-02-20 — End: 2022-02-20
  Administered 2022-02-20: 20:00:00 via INTRAVENOUS

## 2022-02-20 MED ORDER — ipratropium-albuteroL (DUO-NEB) 0.5 mg-3 mg(2.5 mg base)/3 mL nebulizer solution 3 mL
0.5 | Freq: Once | RESPIRATORY_TRACT | Status: DC | PRN
Start: 2022-02-20 — End: 2022-02-20

## 2022-02-20 MED ORDER — lidocaine (PF) 1 % (XYLOCAINE) 10 mg/mL (1 %) injection
10 | INTRAMUSCULAR | Status: AC
Start: 2022-02-20 — End: ?

## 2022-02-20 MED ORDER — oxyCODONE-acetaminophen (PERCOCET) 5-325 mg per tablet 1-2 tablet
5-325 | ORAL | Status: DC | PRN
Start: 2022-02-20 — End: 2022-02-21
  Administered 2022-02-20 – 2022-02-21 (×5): 5-325 via ORAL

## 2022-02-20 MED ORDER — BUPivacaine HCl (MARCAINE) 0.25 % (2.5 mg/mL) injection
0.25 | INTRAMUSCULAR | Status: DC | PRN
Start: 2022-02-20 — End: 2022-02-20
  Administered 2022-02-20: 20:00:00 0.25 % (2.5 mg/mL)

## 2022-02-20 MED ORDER — fentaNYL (PF) (SUBLIMAZE) injection 25-50 mcg
50 | INTRAMUSCULAR | Status: DC | PRN
Start: 2022-02-20 — End: 2022-02-20
  Administered 2022-02-20 (×2): 50 ug via INTRAVENOUS

## 2022-02-20 MED ORDER — famotidine (PF) (PEPCID) 20 mg in 0.9% NaCl IVPB Premix
20 | Freq: Once | INTRAVENOUS | Status: AC
Start: 2022-02-20 — End: 2022-02-20

## 2022-02-20 MED ORDER — ondansetron (ZOFRAN) injection
4 | INTRAMUSCULAR | Status: DC | PRN
Start: 2022-02-20 — End: 2022-02-20
  Administered 2022-02-20: 21:00:00 4 mg/2 mL via INTRAVENOUS

## 2022-02-20 MED ORDER — scopolamine (TRANSDERM-SCOP) 1.5 mg (1 mg/72 hrs) patch 1 patch
1.5 | Freq: Once | TRANSDERMAL | Status: DC | PRN
Start: 2022-02-20 — End: 2022-02-21
  Administered 2022-02-20: 19:00:00 1.5 via TRANSDERMAL

## 2022-02-20 MED ORDER — acetaminophen (TYLENOL) tablet 1,000 mg
500 | Freq: Once | ORAL | Status: AC
Start: 2022-02-20 — End: 2022-02-20
  Administered 2022-02-20: 19:00:00 500 mg via ORAL

## 2022-02-20 MED ORDER — meperidine (PF) (DEMEROL) injection 12.5-25 mg
25 | INTRAMUSCULAR | Status: DC | PRN
Start: 2022-02-20 — End: 2022-02-20
  Administered 2022-02-20 (×2): 25 mg via INTRAVENOUS

## 2022-02-20 MED ORDER — HYDROmorphone (DILAUDID) injection 0.2-0.5 mg
0.5 | INTRAMUSCULAR | Status: DC | PRN
Start: 2022-02-20 — End: 2022-02-20

## 2022-02-20 NOTE — Op Note (Signed)
DATE OF SURGERY: 02/20/2022  PATIENT'S AGE: 36  SURGEON: Nile Dear. Laurence Compton, MD  ASSISTANT:  Ron Agee, RN.  ANESTHESIOLOGIST:  Assunta Gambles, MD.  ANESTHESIA:  General.    PREOPERATIVE DIAGNOSES:  1.  Abnormal uterine bleeding.  2.  Chronic pelvic pain.    POSTOPERATIVE DIAGNOSES:  1.  Abnormal uterine bleeding.  2.  Chronic pelvic pain.    OPERATION:  1.  Laparoscopic-assisted vaginal hysterectomy.  2.  Bilateral salpingectomy.  3.  Left oophorectomy.  4.  Cystoscopy.    ESTIMATED BLOOD LOSS:  40 mL    FINDINGS:  Grossly normal uterus, fallopian tubes and ovaries.    COMPLICATIONS:  None.    SPECIMENS:  Cervix, uterus, bilateral fallopian tubes and left ovary.    PROCEDURE:  After the surgical consent was signed, the patient was taken   to the operating room where a timeout procedure was done.  After   achievement of adequate anesthesia, the patient was prepped and draped in   the normal sterile fashion in the dorsal lithotomy position.  A weighted   speculum was placed in the vagina.  A right angle retractor was placed in   the vagina providing good visualization of the cervix.  A Rumi II uterine   manipulator was appropriately placed.  All other instruments were removed   from the vagina.  A Foley catheter was placed.  The surgeon's gloves were   changed and attention was turned to the abdomen.  A 0.5 cm incision was   made through the umbilicus with a scalpel.  A 5 mm trocar with the   laparoscope in it was gently advanced through the umbilicus.  The patient   was placed in the Trendelenburg position.  Under direct visualization, an   8 mm trocar was placed in the left lower quadrant and a 5 mm trocar was   placed in the right lower quadrant.  The uterus, fallopian tubes and   ovaries were grossly normal.  The patient had previously requested that   her left ovary be removed due to chronic pelvic pain in the left lower   quadrant.  The left ovary and fallopian tube were elevated.  The left    ureter was visualized.  The left infundibulopelvic ligament was clamped   adjacent to the ovary with the curved Maryland LigaSure.  The left IP   ligament was then cauterized and transected.  The left fallopian tube and   ovary were then transected from the mesosalpinx using the curved [place]   LigaSure.  The broad ligament was then dissected to the level of the left   round ligament, which was clamped, cauterized, and transected.  The broad   ligament was then dissected down to the level of the left uterine artery   which was skeletonized.  The left uterine artery was then clamped,   cauterized, and transected with the curved [place] LigaSure.  The   anterior leaf of the broad ligament was incised along the bladder   reflection to the midline.    Attention was turned to the right aspect of the pelvis.  The right   fallopian tube was elevated and was transected from the mesosalpinx using   the curved [place] LigaSure.  The same device was used to clamp,   cauterize and transect the right uteroovarian ligament.  The broad   ligament was dissected to the level of the right round ligament, which was  clamped, cauterized, and transected.  The broad  ligament was then   dissected down to the level of the right uterine artery which was   skeletonized.  The right uterine artery was then clamped, cauterized, and   transected using the curved [place] LigaSure.  The anterior leaf of the   broad ligament was incised along the bladder reflection to the midline.    The bladder was then gently dissected off the lower uterine segment and   the cervix.  An L-hook monopolar device was used to transect the cervix   from the upper portion of the vagina.  This was done in a circumferential   pattern under direct visualization.  The cervix, uterus, bilateral   fallopian tubes and left ovary were removed through the vagina.  A   weighted speculum was placed in the vagina.  A right angle retractor was   placed in the vagina  providing good visualization of the vaginal cuff.    The vaginal cuff angles were closed with figure-of-eight stitches of #0   Vicryl and were transfixed to the ipsilateral uterosacral ligaments.  The   remainder of the vaginal cuff was closed with a series of interrupted #0   Vicryl figure-of-eight sutures.  Hemostasis was noted.  The Foley catheter  was removed.  A cystoscope was gently advanced into the bladder.  Urine   was noted to flow freely through both ureters into the bladder, indicating  that both ureters were patent and functioning properly.  The cystoscope   was removed.  The Foley catheter was placed back into the bladder.  The   surgeon's gloves were changed once more and attention was turned back to   the abdomen.  The pneumoperitoneum was recreated.  The pelvis was   irrigated and suctioned out.  Excellent hemostasis was noted.  All   instruments were removed from the abdomen.  The pneumoperitoneum was   released.  The 3 trocars were removed.  The 3 skin incisions where the   trocars were placed were reapproximated with 4-0 Monocryl suture in a   subcutaneous fashion.  Sponge, needle and instrument counts were correct   x2.  The patient was taken to the PACU awake and in stable condition.    Nile Dear. Laurence Compton, MD  D: 02/20/2022 15:45  T: 02/21/2022 06:23  J/R: [id number]  BARCH / Cherlynn Polo

## 2022-02-20 NOTE — Plan of Care (Signed)
Problem: Pain  Goal: Patient will achieve activity goals and show improvement in function, mood, and coping  Outcome: Progressing  Goal: Pain level within patient's desired comfort-function goal  Outcome: Progressing  Goal: Patient will verbalize understanding and be an active participant in the pain management care plan  Outcome: Progressing  Goal: Patient will be able to identify strategies to reduce anxiety and improve coping  Outcome: Progressing

## 2022-02-20 NOTE — Anesthesia Post-Procedure Evaluation (Signed)
Patient Name: Rachel [name]  Procedures performed: Procedure(s):  LAPAROSCOPIC ASSISTED VAGINAL HYSTERECTOMY, bilateral salpingectomy, left oophorectomy, cystoscopy    Last Vitals:   Vitals Value Taken Time   BP 114/66 02/20/22 1415   Pulse 74 02/20/22 1415   Resp 16 02/20/22 1400   SpO2 94 % 02/20/22 1415   Temp 36 ?C 02/20/22 1355       Planned Anesthesia Type: general  Final Anesthesia Type: general  Patients Current Location: PACU  Level of Consciousness: awake  Post Procedure Pain:adequate analgesia  Airway: Patent  Respiratory Status: room air  Cardio Status: hemodynamically stable  Hydration: adequately hydrated  PONV prophylaxis Ordered  PONV? NO  no anesthesia complication,       planned opioid use     multimodal analgesia used between 6 hours prior to anesthesia start to PACU discharge      The patient was able to participate in the post op evaluation    Comments:      Bettey Mare, MD  7:28 PM

## 2022-02-20 NOTE — Anesthesia Pre-Procedure Evaluation (Addendum)
Anesthesia Evaluation     Patient summary reviewed      Airway   Mallampati: I  TM distance: > 8 cm  Neck ROM: full  ULBT: I  no obesity  no TMJ problem    Dental    (+) age appropriate    Pulmonary - negative ROS    breath sounds clear to auscultation  Cardiovascular   Exercise tolerance: good (4-7 METS)  (+) dysrhythmias,   (-) murmur    Rhythm: regular  Rate: normal  ROS comment: History of frequent PVCs    Neuro/Psych    (+) headaches,     GI/Hepatic/Renal    (+) GERD,     Endo/Other     Musculoskeletal                    Anesthesia Plan    ASA 2     general     intravenous induction     Patient is not a smoker          Anesthetic plan, risks and benefits discussed with patient.  Risks discussed included (but were not limited to)   General risks drug reaction, dental injury, nausea, pain, respiratory events, sore throat, voice injury and deliriumConsenting person understands and agrees to proceed. anesthesia consent form used. PARQ.  Pre-Anesthesia Evaluation Completed at:  02/20/2022 11:10 AM

## 2022-02-20 NOTE — OR Nursing (Signed)
pts glasses to pacu with pt

## 2022-02-20 NOTE — Plan of Care (Signed)
Pt pleasant and welcoming to staff. Ambulated short distance in room, safely. Refer to [facility] for pain management medication. Pt comfortable resting In bed. No vaginal bleeding noted. No immediate concerns.     Vitals:    02/20/22 1720   BP: 123/63   Pulse: 86   Resp: 16   Temp: 36.5 ?C (97.7 ?F)   SpO2: 94%      Problem: Pain  Goal: Patient will achieve activity goals and show improvement in function, mood, and coping  Outcome: Progressing  Goal: Pain level within patient's desired comfort-function goal  Outcome: Progressing  Goal: Patient will verbalize understanding and be an active participant in the pain management care plan  Outcome: Progressing

## 2022-02-20 NOTE — Brief Op Note (Signed)
Brief Operative Note    Procedure(s) (LRB):  LAPAROSCOPIC ASSISTED VAGINAL HYSTERECTOMY, bilateral salpingectomy, left oophorectomy, cystoscopy (N/A)     Preoperative Diagnoses:   abnormal uterine bleeding, chronic pelvic pain    Postoperative Diagnoses:  Post-Op Diagnosis Codes:     * Abnormal uterine bleeding [N93.9]     * Chronic pelvic pain in female [R10.2, G89.29]     Surgeons: Surgeon(s) and Role:     * Conni Elliot, MD - Primary    Assistant(s): Nurse First Assist: Lytle Butte, RN     Anesthesia Provider: Anesthesiologist: Nancy Nordmann, MD    Anesthesia Type: General    Height & Weight:165.1 cm (65) & 78.5 kg (173 lb)     Specimens:   ID Type Source Tests Collected by Time Destination   A :  Tissue Fallopian Tube, Right TISSUE EXAM Conni Elliot, MD 02/20/2022 1224    B : cervix, uterus, left fallopian tube and left ovary Tissue Uterus TISSUE Foy Guadalajara, MD 02/20/2022 1241               Implants:none    Antibiotics (admin):   Recent Abx Admin                   ceFAZolin (ANCEF) in D5W IVPB 2 g/50 mL premix (g) 2 g Given 02/20/22 1149                Incision Time:   Anes Incision Time     Date Time Event    02/20/2022 1157 Incision / Procedure Start           Timeout Completed:  Timeouts     Antonietta Barcelona, RN at [facility] Feb 20, 2022 1154 PST     Timeout Details     Timeout type: Pre-incision                Unsigned Timeout     Timeout Details     Timeout type:   Sign-out (Not signed)                       Estimated Blood Loss: 40 mL from 02/20/2022 11:57 AM to 02/20/2022  1:03 PM    Urethral Catheter:   Urethral Catheter Non-latex 16 Fr. (Active)   Placement Date/Time: 02/20/22 1155   Inserted by: Dr Laurence Compton  Sterile Technique: Yes  Catheter Type: Non-latex  Tube Size (Fr.): 16 Fr.  Catheter Balloon Size: 10 mL  Collection Container: Standard drainage bag  Urine Returned: Yes  Tolerance: Good   Number of days: 0       Findings:  Grossly normal uterus, fallopian tubes, and  ovaries.    Complications: None     Disposition: Home     Condition:  Good    Follow up:  In the clinic in two weeks.    Conni Elliot

## 2022-02-21 LAB — CBC WITH AUTO DIFFERENTIAL
Basophils %: 0 % (ref 0–2)
Basophils, Absolute: 0 10*3/ÂµL (ref 0.0–0.1)
Eosinophils %: 0 % (ref 0–5)
Eosinophils, Absolute: 0 10*3/ÂµL (ref 0.0–0.4)
HCT: 38.7 % (ref 35.0–48.0)
Hemoglobin: 13.2 g/dL (ref 11.7–16.5)
Lymphocytes %: 13 % — ABNORMAL LOW (ref 20–40)
Lymphocytes, Absolute: 1.6 10*3/ÂµL (ref 1.0–4.0)
MCH: 29.4 pg (ref 28.3–33.3)
MCHC: 34.1 g/dL (ref 32.5–36.0)
MCV: 86.1 fL (ref 81.0–100.0)
MPV: 8.1 fL (ref 6.9–10.0)
Monocytes %: 8 % (ref 2–12)
Monocytes, Absolute: 0.9 10*3/ÂµL (ref 0.2–1.0)
Neutrophils %: 79 % — ABNORMAL HIGH (ref 50–74)
Neutrophils, Absolute: 9.6 10*3/ÂµL — ABNORMAL HIGH (ref 2.0–7.4)
Platelet Count: 182 10*3/ÂµL (ref 150–405)
RBC: 4.49 10*6/ÂµL (ref 3.80–5.60)
RDW: 12.7 % (ref 11.7–16.1)
WBC: 12.2 10*3/ÂµL — ABNORMAL HIGH (ref 4.5–11.0)

## 2022-02-21 MED ORDER — oxyCODONE-acetaminophen (PERCOCET) 5-325 mg per tablet
5-325 | ORAL_TABLET | ORAL | 0 refills | 28.00000 days | Status: AC | PRN
Start: 2022-02-21 — End: 2022-03-23

## 2022-02-21 MED ORDER — ibuprofen (ADVIL) 800 mg tablet
800 | ORAL_TABLET | Freq: Four times a day (QID) | ORAL | 1 refills | 10.00000 days | Status: DC | PRN
Start: 2022-02-21 — End: 2022-03-08

## 2022-02-21 MED ORDER — docusate sodium (COLACE) 100 mg capsule
100 | ORAL_CAPSULE | Freq: Two times a day (BID) | ORAL | 1 refills | 30.00000 days | Status: DC | PRN
Start: 2022-02-21 — End: 2022-03-08

## 2022-02-21 MED ORDER — ondansetron (ZOFRAN-ODT) 4 mg disintegrating tablet
4 | ORAL_TABLET | Freq: Three times a day (TID) | ORAL | 0 refills | Status: DC | PRN
Start: 2022-02-21 — End: 2022-03-08

## 2022-02-21 NOTE — Discharge Planning (AHS/AVS) (Signed)
Pt provided w/ AVS. Hospital stay, precautions, medications, and follow up provided. Pt denied questions or concerns at this time. Pharmacy verified scripts receiving and filling medications. Pt denied WC or transport assistance to front lobby. Pt accompanied by CNA to front lobby. Transported home via private automobile.

## 2022-02-21 NOTE — Progress Notes (Signed)
GYN Progress Note    Rachel [name]  DOB:  1986-01-16   MRN:   [id number]      Subjective  Patient denies fevers or chills.  She is tolerating a regular diet and ambulating well.  She is urinating without difficulty and passing flatus.  She denies any vaginal bleeding, but did have a little bit of spotting on the toilet paper last night.  She would like to be discharged home today.     PHYSICAL EXAM  VITAL SIGNS: BP 115/65 (BP Location: Right arm, Patient Position: Lying)   Pulse 84   Temp 36.8 ?C (98.2 ?F) (Temporal)   Resp 18   Ht 165.1 cm (65)   Wt 78.5 kg (173 lb)   LMP 02/10/2022 (Exact Date)   SpO2 94%   BMI 28.79 kg/m?   General:  NAD  CV:  RRR  PULM:  CTAB  Abdomen:  Soft, appropriately tender, non-distended, + bowel sounds  Incisions:  Clean, dry, and intact with no erythema or drainage  Ext:  No calf pain or swelling  Pelvic:  deferred    LABS  Recent Results (from the past 24 hour(s))   POCT pregnancy, urine -Next Routine    Collection Time: 02/20/22 10:55 AM   Result Value Ref Range    Urine, pregnancy Negative Negative   CBC with Auto Differential -AM Draw    Collection Time: 02/21/22  5:16 AM   Result Value Ref Range    WBC 12.2 (H) 4.5 - 11.0 10*3/?L    RBC 4.49 3.80 - 5.60 10*6/?L    Hemoglobin 13.2 11.7 - 16.5 g/dL    HCT 78.2 95.6 - 21.3 %    MCV 86.1 81.0 - 100.0 fL    MCH 29.4 28.3 - 33.3 pg    MCHC 34.1 32.5 - 36.0 g/dL    RDW 08.6 57.8 - 46.9 %    Platelet Count 182 150 - 405 10*3/?L    MPV 8.1 6.9 - 10.0 fL    Neutrophils % 79 (H) 50 - 74 %    Lymphocytes % 13 (L) 20 - 40 %    Monocytes % 8 2 - 12 %    Eosinophils % 0 0 - 5 %    Basophils % 0 0 - 2 %    Neutrophils, Absolute 9.6 (H) 2.0 - 7.4 10*3/?L    Lymphocytes, Absolute 1.6 1.0 - 4.0 10*3/?L    Monocytes, Absolute 0.9 0.2 - 1.0 10*3/?L    Eosinophils, Absolute 0.0 0.0 - 0.4 10*3/?L    Basophils, Absolute 0.0 0.0 - 0.1 10*3/?L         ASSESSMENT/PLAN  81. 36 year old POD#1 s/p laparoscopic assisted hysterectomy, bilateral  salpingectomy, left oophorectomy, and a cystoscopy.    - Patient is recovering well from her surgery  - Basic postoperative instructions were reviewed in detail  - Condition:  Good  - Follow up:  In the clinic in two weeks  - Precautions were reviewed as to when she should call the clinic sooner

## 2022-02-28 ENCOUNTER — Inpatient Hospital Stay: Admit: 2022-02-28 | Discharge: 2022-03-23 | Attending: MD | Primary: MD

## 2022-02-28 ENCOUNTER — Ambulatory Visit: Admit: 2022-02-28 | Discharge: 2022-02-28 | Attending: MD | Primary: MD

## 2022-02-28 DIAGNOSIS — M461 Sacroiliitis, not elsewhere classified: Secondary | ICD-10-CM

## 2022-02-28 DIAGNOSIS — M545 Low back pain, unspecified: Secondary | ICD-10-CM

## 2022-02-28 MED ORDER — meloxicam (MOBIC) 15 mg tablet
15 | ORAL_TABLET | Freq: Every day | ORAL | 2 refills | Status: AC
Start: 2022-02-28 — End: 2022-05-29

## 2022-02-28 NOTE — Addendum Note (Signed)
Addended by: Rubin Payor A. on: 02/28/2022 02:08 PM     Modules accepted: Orders

## 2022-02-28 NOTE — Telephone Encounter (Signed)
Patient called in stating that Dr Rudie Meyer requires and MRI to be seen at his clinic, and also that he is booking out a year. Please review and call patient back to discuss. Thank you

## 2022-02-28 NOTE — Patient Instructions (Addendum)
Let's try meloxicam for your pain for now. Will refer to physical therapy and dr Durwin Reges. Will also refer to PT to see if they can help
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
## 2022-02-28 NOTE — Progress Notes (Signed)
Rachel [name] is a 36 y.o. female who presents today for back and left hip pain x 1 week.  Patient denied urinary pain .    BP: 129/70 / Pulse: 80 / SpO2: 98 % / Temp: 36.4 ?C (97.5 ?F)    Screening:   Depression: PHQ-9: 17 Severe Depression (12/20/2021)  Anxiety: GAD-7: 10  (12/20/2021)  Substance Use: EtOH - EtOH SBIRT Negative (12/20/2021)  Drug - Drug SBIRT Negative (12/20/2021)  Fall Screen: Fall Risk Screen: N/A  Cognitive Assessment:   Patient does not require a PEG score Hearing/Vision Screening: No results found.    SDOH:  Food insecurity: low risk (12/20/2021)  Transportation needs: none (12/20/2021)  Housing stability: low risk (12/20/2021)    Health Maintenance:  Health Maintenance Due   Topic Date Due   . COVID-19 Vaccine (2 - 2023-24 season) 11/11/2021   . Tetanus Vaccine (2 - Td or Tdap) 01/05/2020   . Influenza Vaccine (1) 12/11/2021       MEDICATIONS ADMINISTERED-LAST 24 Hours  (last 24 hrs)         ** No medications to display **          Ambulatory Nursing Form

## 2022-03-01 NOTE — Telephone Encounter (Signed)
Patient called back and I relayed the below message to the patient. She verbalized understanding.    HR

## 2022-03-01 NOTE — Telephone Encounter (Signed)
LVM to let pt know  

## 2022-03-05 ENCOUNTER — Inpatient Hospital Stay: Admit: 2022-03-05 | Discharge: 2022-03-05 | Disposition: A | Discharge status: Home / Self Care | Attending: MD

## 2022-03-05 DIAGNOSIS — N760 Acute vaginitis: Secondary | ICD-10-CM

## 2022-03-05 LAB — URINALYSIS WITH MICROSCOPIC, CULTURE IF INDICATED
Ascorbic Acid, Urine: NEGATIVE
Bacteria, Urine: NONE SEEN /HPF
Bilirubin, Urine: NEGATIVE
Glucose, Urine: NEGATIVE mg/dL
Ketones, Urine: NEGATIVE mg/dL
Leukocyte Esterase, Urine: NEGATIVE
Nitrite, Urine: NEGATIVE
Protein, Urine: NEGATIVE mg/dL
RBC, Urine: <1 /HPF (ref 0–4)
Renal/Transitional Epithelial, Urine: 0 /HPF (ref 0–2)
Specific Gravity, Urine: 1.012 (ref 1.005–1.030)
Squamous Epithelial, UA: <1 /HPF (ref 0–4)
Urobilinogen, Urine: <2 mg/dL (ref <2.0)
WBC, Urine: 1 /HPF (ref 0–4)
pH, UA: 6 (ref 5.0–7.5)

## 2022-03-05 LAB — BASIC METABOLIC PANEL
Anion Gap: 10.2 mmol/L (ref 9.0–18.0)
BUN / Creatinine Ratio: 18 (ref 12.0–20.0)
BUN: 11 mg/dL (ref 8–20)
CO2 - Carbon Dioxide: 26 mmol/L (ref 17–27)
Calcium: 8.7 mg/dL (ref 8.6–10.6)
Chloride: 104.8 mmol/L (ref 101.0–111.0)
Creatinine: 0.61 mg/dL (ref 0.60–1.30)
Glomerular Filtration Rate Estimate (Female): >90 mL/min/{1.73_m2} (ref >=60)
Glucose: 58 mg/dL — ABNORMAL LOW (ref 74–106)
Osmolality Calculation: 278.4 mOsm/kg (ref 275.0–300.0)
Potassium: 3.73 mmol/L (ref 3.50–5.10)
Sodium: 141 mmol/L (ref 135–145)

## 2022-03-05 LAB — CBC WITH AUTO DIFFERENTIAL
Basophils %: 1 % (ref 0–2)
Basophils, Absolute: 0.1 10*3/ÂµL (ref 0.0–0.1)
Eosinophils %: 1 % (ref 0–5)
Eosinophils, Absolute: 0.1 10*3/ÂµL (ref 0.0–0.4)
HCT: 39.4 % (ref 35.0–48.0)
Hemoglobin: 13.5 g/dL (ref 11.7–16.5)
Lymphocytes %: 20 % (ref 20–40)
Lymphocytes, Absolute: 1.6 10*3/ÂµL (ref 1.0–4.0)
MCH: 29.3 pg (ref 28.3–33.3)
MCHC: 34.2 g/dL (ref 32.5–36.0)
MCV: 85.7 fL (ref 81.0–100.0)
MPV: 7.5 fL (ref 6.9–10.0)
Monocytes %: 5 % (ref 2–12)
Monocytes, Absolute: 0.4 10*3/ÂµL (ref 0.2–1.0)
Neutrophils %: 73 % (ref 50–74)
Neutrophils, Absolute: 5.8 10*3/ÂµL (ref 2.0–7.4)
Platelet Count: 184 10*3/ÂµL (ref 150–405)
RBC: 4.59 10*6/ÂµL (ref 3.80–5.60)
RDW: 12.5 % (ref 11.7–16.1)
WBC: 7.9 10*3/ÂµL (ref 4.5–11.0)

## 2022-03-05 LAB — ANTIBODY SCREEN (HCLL): Antibody Screen: NEGATIVE

## 2022-03-05 LAB — WET MOUNT, SLM
Clue Cells: NONE SEEN
Trichomonas: NONE SEEN
Yeast: NONE SEEN

## 2022-03-05 LAB — ABO/RH (HCLL): Rh (D): POSITIVE

## 2022-03-05 LAB — PROTIME-INR
INR: 1 (ref 0.9–1.1)
Protime: 11.9 Seconds (ref 10.0–13.2)

## 2022-03-05 MED ORDER — metroNIDAZOLE (FLAGYL) 500 mg tablet
500 | ORAL_TABLET | Freq: Two times a day (BID) | ORAL | 0 refills | 30.00000 days | Status: DC
Start: 2022-03-05 — End: 2022-12-13

## 2022-03-05 NOTE — ED Triage Notes (Signed)
Patient c/o of vaginal spotting since her hysterectomy that has increased over days last few days with odor and draining constantly. Denies saturating pads. S/p hysterectomy 12/11, right ovary remains. Patient advised by on call OB to come to ED for eval.

## 2022-03-05 NOTE — ED Notes (Signed)
This RN and Dr. Kennyth Arnold at bedside for pelvic exam

## 2022-03-05 NOTE — Telephone Encounter (Addendum)
Sent Rx for Flagyl to [facility] - see encounter documentation for full details. Patient has previously tolerated this medication without difficulty.

## 2022-03-05 NOTE — ED Notes (Signed)
Pt discharged by RN. Verbalized understanding of d/c instructions, no further questions. Departed ED in stable condition
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
## 2022-03-05 NOTE — Discharge Instructions (Addendum)
Dr. Matlock called a FlagylKennyth Arnoldscription into the [facility] pharmacy for you.  Please pick that up today and take it regularly as prescribed.

## 2022-03-07 NOTE — Telephone Encounter (Signed)
Message from the answering service from 03/05/22 at 10:29am. Message was sent to Dr Matlock who was on call. Kennyth Arnoldsage reads: Calling about an infection.    *Sent to Dr Barton and sLaurence Comptonf as an Lorain Childes

## 2022-03-08 ENCOUNTER — Ambulatory Visit: Admit: 2022-03-08 | Discharge: 2022-03-08 | Attending: MD | Primary: MD

## 2022-03-08 DIAGNOSIS — Z09 Encounter for follow-up examination after completed treatment for conditions other than malignant neoplasm: Secondary | ICD-10-CM

## 2022-03-08 NOTE — Progress Notes (Signed)
Pt here for 2 week post op visit. Pt states spotting up until today. Pt denies any fevers or chills, bleeding, abnormal discharge. Pt reports no issues with constipation or urination. ER visit on 03/05/22, given abx for BV.

## 2022-03-08 NOTE — Progress Notes (Signed)
Rachel [name] is a 36 y.o. female, DOB Apr 17, 1985, who presents today for a postoperative exam.      History of Present Illness  HPI   Rachel [name] presents for a postoperative exam.  On 02/20/22, she had a laparoscopic assisted vaginal hysterectomy, bilateral salpingectomy, left oophorectomy, and a cystoscopy.  The pathology from her procedure was benign.  She denies fevers or chills.  She was seen in the ER on 03/05/22, and was treated for bacterial vaginosis.  She also has occasional vaginal spotting, and was told that she was over-doing it, and she needs to rest and let her body recover from surgery.  Her postoperative pain has steadily improved a little each day.  Her incisions are healing well.           Review of Systems     Constitutional: ?Denies fevers or chills  Neurologic: ?Denies headaches or neuropathy  CV: ?Denies chest pain or palpitations  PULM: ?Denies shortness of breath or cough  GI: ?Denies diarrhea or constipation  GU: ?Denies dysuria or hematuria  Ext: ?Denies any musculoskeletal issues    Vitals:    03/08/22 1341   BP: 118/74   Pulse: 79   Temp: 36.8 ?C (98.2 ?F)   SpO2: 98%    Body mass index is 29.62 kg/m?Marland Kitchen  Physical Exam    General: ?NAD  CV: ?RRR  Abdomen: ?Soft, nondistended?nontender  Ext: ?No calf pain or swelling  External genitalia: ?Normal appearance, no lesions  Urethral meatus: ?Normal size and location, no lesions  Urethra: ?No masses or tenderness  Vagina: ?No abnormal discharge, one stitch tore through the tissue ubt the other 4 stitches were intact, cuff is healing well  Cervix: ?Surgically absent   Uterus: ?Surgically absent    ASSESSMENT & PLAN:       ICD-10-CM    1. Postoperative examination  Z09         1.  Postoperative Exam - [name] is healing well from her hysterectomy.  Basic postoperative instructions were reviewed in detail.  She will continue rest for 8 weeks postoperatively.  She was once again counseled to take it easy and let her body recover.  She is not to lift  more than 10 lbs.  Precautions were reviewed as to when she should call the clinic or seek medical attention.  RTC in 3-4 weeks to reassess.

## 2022-03-09 ENCOUNTER — Encounter: Primary: MD

## 2022-03-14 NOTE — Telephone Encounter (Signed)
Rachel [name]  P [facility] (supporting Conni Elliot, MD) 2 hours ago (1:58 PM)       Sorry to be a pain but I need to be able to pay my bills and I literally am doing the same thing at home, if not more then I'd be doing at work.        Rachel [name]  [facility] (supporting Conni Elliot, MD) 2 hours ago (1:56 PM)       I guess! I thought the [place] leave act would go through and I have heard nothing. I literally am doing the same thing at home that I'd be doing at work, but okay. I have not bled at all today. Is there any way I can come at the end of the week and maybe get looked at??       [employer], CMA  [employer] 5 hours ago (10:58 AM)     [facility]  Andreas Blower,  Good morning, I spoke with Dr.Barton,he would like to wait and reevaluate at your next appointment on 03/29/22 as he would like to make sure that you are healing properly so you can safely return to work without complications.   ?  -Silvia       Rachel [name]  P [facility] (supporting Conni Elliot, MD) 7 hours ago (9:13 AM)       Dr. Laurence Compton  ?   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 I am taking it easy at home, literally almost doing nothing. I just financially can't afford to stay out any longer than that. I am being really good and trying not to bend or pick up anything over 10lbs. 4 weeks would be the 8th of January. 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 I literally will be doing like phone calls and stuff. I just am stressed that I won't financially be in a spot to stay home. I promise to do all the right things and I am doing what you told me to do. I have made my family help me and literally done almost nothing since leaving your office. My spotting has almost stopped. Most often it is stopped, every now and then I get a little spot  but I wonder if that's just my body. It's all dark though and no bright red anything. Pretty please. 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   ?  Tram

## 2022-03-14 NOTE — Telephone Encounter (Signed)
-----   Message from [place]. [place] sent at 03/14/2022  1:58 PM PST -----  Regarding: Returning to work  Contact: [phone]  Sorry to be a pain but I need to be able to pay my bills and I literally am doing the same thing at home, if not more then I'd be doing at work.

## 2022-03-15 NOTE — Telephone Encounter (Signed)
Pt has visit at 4 weeks post op to reevaluate.

## 2022-03-22 ENCOUNTER — Other Ambulatory Visit: Admit: 2022-03-22 | Primary: MD

## 2022-03-22 ENCOUNTER — Ambulatory Visit: Admit: 2022-03-22 | Discharge: 2022-03-22 | Attending: MD | Primary: MD

## 2022-03-22 DIAGNOSIS — N898 Other specified noninflammatory disorders of vagina: Secondary | ICD-10-CM

## 2022-03-22 DIAGNOSIS — Z09 Encounter for follow-up examination after completed treatment for conditions other than malignant neoplasm: Secondary | ICD-10-CM

## 2022-03-22 LAB — WET MOUNT, SLM
Clue Cells: NONE SEEN
Spermatozoa: NONE SEEN
Trichomonas: NONE SEEN
Yeast: NONE SEEN

## 2022-03-22 NOTE — Progress Notes (Signed)
Pt here for 4 week post op visit. Pt denies any fevers or chills, bleeding. Pt states having some yellow tinged discharged, pt would like if we can due a swab to R/O yeast infection.  Pt reports no issues with constipation or urination.     Examination chaperoned by Celesta Gentile, CMA.

## 2022-03-22 NOTE — Progress Notes (Signed)
Rachel [name] is a 37 y.o. female, DOB February 05, 1986, who presents today for a postoperative exam.      History of Present Illness  HPI   Rachel [name] presents for a postoperative exam.  On 02/20/22, she had a laparoscopic assisted vaginal hysterectomy, bilateral salpingectomy, left oophorectomy, and a cystoscopy.  The pathology from her procedure was benign.  She denies fevers, chills, or vaginal bleeding.  She does have come white vaginal discharge, which she would like to get cultured.  Her postoperative pain is minimal.         Review of Systems     Constitutional: Denies fevers or chills  Neurologic: Denies headaches or neuropathy  CV: Denies chest pain or palpitations  PULM: Denies shortness of breath or cough  GI: Denies diarrhea or constipation  GU: Denies dysuria or hematuria  Ext: Denies any musculoskeletal issues    Vitals:    03/22/22 1408   BP: 134/76   Pulse: 76   Temp: 36.8 C (98.3 F)   SpO2: 98%    Body mass index is 29.7 kg/m.  Physical Exam    General: NAD  CV: RRR  Abdomen: Soft, nondistendednontender  Ext: No calf pain or swelling  External genitalia: Normal appearance, no lesions  Urethral meatus: Normal size and location, no lesions  Urethra: No masses or tenderness  Vagina: No abnormal discharge, one stitch tore through the tissue but the other 4 stitches were intact, cuff is healing well  Cervix: Surgically absent   Uterus: Surgically absent    ASSESSMENT & PLAN:       ICD-10-CM    1. Postoperative examination  Z09         1.  Postoperative Exam - Rachel [name] is healing well from her hysterectomy.  Basic postoperative instructions were reviewed in detail.  She will continue rest for 8 weeks postoperatively.  She was once again counseled to take it easy and let her body recover.  She is not to lift more than 10 lbs.  I collected a wet prep today.  I will notify her of the results and make a plan at that point.  She will RTC in 4 weeks.

## 2022-03-24 NOTE — Telephone Encounter (Signed)
Called pt to notify of results below. Pt verbalized understanding.

## 2022-03-24 NOTE — Telephone Encounter (Signed)
-----   Message from Conni Elliot, MD sent at 03/24/2022 11:13 AM PST -----  Please notify Rachel [name] that her wet prep culture was normal.

## 2022-03-28 ENCOUNTER — Ambulatory Visit: Admit: 2022-03-29 | Discharge: 2022-03-29 | Attending: Family | Primary: MD

## 2022-03-28 ENCOUNTER — Other Ambulatory Visit: Admit: 2022-03-29 | Primary: MD

## 2022-03-28 DIAGNOSIS — J029 Acute pharyngitis, unspecified: Secondary | ICD-10-CM

## 2022-03-28 NOTE — Progress Notes (Signed)
Rachel [name] is a 37 y.o. female who presents today for possible throat abscess or infection for the last day.    BP: 111/76 / Pulse: 80 / SpO2: 99 % / Temp: 37 C (98.6 F)    Screening:   Depression:   Anxiety:   Substance Use:   Fall Screen: Fall Risk Screen: N/A  Cognitive Assessment:   Patient does not require a PEG score Hearing/Vision Screening: No results found.    SDOH:            Health Maintenance:  Health Maintenance Due   Topic Date Due    COVID-19 Vaccine (2 - 2023-24 season) 11/11/2021    Influenza Vaccine (1) 12/11/2021    Tetanus Vaccine (2 - Td or Tdap) 01/05/2020       MEDICATIONS ADMINISTERED-LAST 24 Hours  (last 24 hrs)         ** No medications to display **          Ambulatory Nursing Form
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
## 2022-03-28 NOTE — Patient Instructions (Signed)
You have upper respiratory like symptoms.  On top of the medications being prescribed I am recommending using over-the-counter products to include: Honey lemon gargles, honey lemon tea, Vicks VapoRub.  Try to avoid nasal sprays specifically Afrin.  You may consider over-the-counter Delsym or other cough drops.  Be cautious with any acetaminophen or ibuprofen containing cold medicines not to overdose.  Maintain good hydration maintain good nutrition.  Follow-up with your primary care clinic as directed    You are being prescribed an antibiotic for your condition.  Please take the entire prescription as prescribed.  I recommend eating yogurt or taking a good probiotic to offset the gastrointestinal effect of the antibiotic.  Follow with your primary care provider as directed.  Return to clinic if needed.  Go to ER for health complications.  Maintain good hydration, maintain good nutrition.    You have been given a dose of Decadron which will help with the swelling.  We will adjust focus based on the CT and lab results.  At this time pick up the oral antibiotics as prescribed take them as directed.  Go to ER for any complications or concerns

## 2022-03-28 NOTE — Progress Notes (Signed)
Rachel [name] is a 37 y.o. female who presents today for possible throat abscess or infection. Decadron today with no adverse reaction.    BP: 111/76 / Pulse: 80 / SpO2: 99 % / Temp: 37 C (98.6 F)     Screening:   Depression: nxiety: Substance Use: Fall Screen: Fall Risk Screen: N/A  Cognitive Assessment: Patient does not require a PEG score Hearing/Vision Screening: No results found.    SDOH:    Health Maintenance:  Health Maintenance Due   Topic Date Due    COVID-19 Vaccine (2 - 2023-24 season) 11/11/2021    Influenza Vaccine (1) 12/11/2021    Tetanus Vaccine (2 - Td or Tdap) 01/05/2020       MEDICATIONS ADMINISTERED-LAST 24 Hours  (last 24 hrs)         MOUTH       Medication Name Action Time Action Route Dose     dexAMETHasone (DECADRON) injection 8 mg 03/28/22 1703 Given Oral 8 mg

## 2022-03-28 NOTE — Addendum Note (Signed)
Addended by: Rolena Infante on: 03/29/2022 10:11 AM     Modules accepted: Orders

## 2022-03-29 ENCOUNTER — Inpatient Hospital Stay: Admit: 2022-03-29 | Discharge: 2022-04-19 | Attending: Family | Primary: MD

## 2022-03-29 ENCOUNTER — Encounter: Attending: MD | Primary: MD

## 2022-03-29 LAB — COMPREHENSIVE METABOLIC PANEL
ALT - Alanine Aminotransferase: 15 IU/L (ref 5–33)
AST - Aspartate Aminotransferase: 18 IU/L (ref 5–32)
Albumin/Globulin Ratio: 1.3 (ref 1.2–2.2)
Albumin: 4.3 g/dL (ref 3.5–5.0)
Alkaline Phosphatase: 90 IU/L (ref 35–105)
Anion Gap: 11.5 mmol/L (ref 9.0–18.0)
BUN / Creatinine Ratio: 23.1 — ABNORMAL HIGH (ref 12.0–20.0)
BUN: 12 mg/dL (ref 8–20)
Bilirubin Total: 0.3 mg/dL (ref 0.10–1.70)
CO2 - Carbon Dioxide: 22 mmol/L (ref 17–27)
Calcium: 9.3 mg/dL (ref 8.6–10.6)
Chloride: 105.5 mmol/L (ref 101.0–111.0)
Creatinine: 0.52 mg/dL — ABNORMAL LOW (ref 0.60–1.30)
Glomerular Filtration Rate Estimate (Female): >90 mL/min/{1.73_m2} (ref >=60)
Glucose: 97 mg/dL (ref 74–106)
Osmolality Calculation: 277.2 mOsm/kg (ref 275.0–300.0)
Potassium: 3.85 mmol/L (ref 3.50–5.10)
Protein Total: 7.6 g/dL (ref 6.4–8.2)
Sodium: 139 mmol/L (ref 135–145)

## 2022-03-29 LAB — CBC WITH AUTO DIFFERENTIAL
Basophils %: 0 % (ref 0–2)
Basophils, Absolute: 0 10*3/ÂµL (ref 0.0–0.1)
Eosinophils %: 1 % (ref 0–5)
Eosinophils, Absolute: 0.1 10*3/ÂµL (ref 0.0–0.4)
HCT: 43.1 % (ref 35.0–48.0)
Hemoglobin: 14.5 g/dL (ref 11.7–16.5)
Lymphocytes %: 24 % (ref 20–40)
Lymphocytes, Absolute: 2.1 10*3/ÂµL (ref 1.0–4.0)
MCH: 28.9 pg (ref 28.3–33.3)
MCHC: 33.6 g/dL (ref 32.5–36.0)
MCV: 86 fL (ref 81.0–100.0)
MPV: 7.8 fL (ref 6.9–10.0)
Monocytes %: 7 % (ref 2–12)
Monocytes, Absolute: 0.6 10*3/ÂµL (ref 0.2–1.0)
Neutrophils %: 68 % (ref 50–74)
Neutrophils, Absolute: 6 10*3/ÂµL (ref 2.0–7.4)
Platelet Count: 171 10*3/ÂµL (ref 150–405)
RBC: 5.02 10*6/ÂµL (ref 3.80–5.60)
RDW: 12.8 % (ref 11.7–16.1)
WBC: 8.8 10*3/ÂµL (ref 4.5–11.0)

## 2022-03-29 MED ORDER — dexAMETHasone (DECADRON) injection 8 mg
4 | Freq: Once | INTRAMUSCULAR | Status: AC
Start: 2022-03-29 — End: 2022-03-28
  Administered 2022-03-29: 01:00:00 4 mg via ORAL

## 2022-03-29 MED ORDER — amoxicillin-clavulanate (AUGMENTIN) 875-125 mg per tablet
875-125 | ORAL_TABLET | Freq: Two times a day (BID) | ORAL | 0 refills | 7.00000 days | Status: AC
Start: 2022-03-29 — End: 2022-04-07

## 2022-03-29 NOTE — Telephone Encounter (Signed)
Spoke with the patient regarding lab results. Patient verbalized understanding & had no further questions.   Curley Fayette, CMA

## 2022-03-29 NOTE — Telephone Encounter (Signed)
-----   Message from Rolena Infante, FNP sent at 03/28/2022  6:18 PM PST -----  CMP stable

## 2022-04-12 ENCOUNTER — Ambulatory Visit: Admit: 2022-04-12 | Discharge: 2022-04-13 | Attending: MD | Primary: MD

## 2022-04-12 DIAGNOSIS — Z09 Encounter for follow-up examination after completed treatment for conditions other than malignant neoplasm: Secondary | ICD-10-CM

## 2022-04-12 NOTE — Progress Notes (Signed)
Fredi Westmark is a 37 y.o. female, DOB [birth year], who presents today for a postoperative exam.      History of Present Illness  HPI   Dorianne presents for a postoperative exam.  On 02/20/22, she had a laparoscopic assisted vaginal hysterectomy, bilateral salpingectomy, left oophorectomy, and a cystoscopy. The pathology from her procedure was benign. She denies fevers, chills, abnormal vaginal discharge, or bleeding.  Her postoperative pain at this point is minimal.        Review of Systems     Constitutional: Denies fevers or chills  Neurologic: Denies headaches or neuropathy  CV: Denies chest pain or palpitations  PULM: Denies shortness of breath or cough  GI: Denies diarrhea or constipation  GU: Denies dysuria or hematuria  Ext: Denies any musculoskeletal issues    Vitals:    04/12/22 1559   BP: 118/84   Pulse: 75   Temp: 36.7 C (98.1 F)   SpO2: 99%    Body mass index is 29.95 kg/m.  Physical Exam    General: NAD  CV: RRR  Abdomen: Soft, nondistendednontender  Ext: No calf pain or swelling  External genitalia: Normal appearance, no lesions  Urethral meatus: Normal size and location, no lesions  Urethra: No masses or tenderness  Vagina: No abnormal discharge, stitches are almost completely dissolved  Cervix: Surgically absent  Uterus: Surgically absent    ASSESSMENT & PLAN:       ICD-10-CM    1. Postoperative examination  Z09         1.  Postoperative Exam - Ndeye is healing well from her hysterectomy.  She will RTC in two weeks to reassess.  Pelvic rest for two more weeks, but Bahar may return to work without restrictions.

## 2022-04-12 NOTE — Progress Notes (Signed)
Pt here for 7 week post op visit, [facility] on 02/21/2023. Pt denies any fevers or chills, bleeding, abnormal discharge. Pt reports no issues with constipation or urination.     Examination chaperoned by Delesia Martinek MirandGretta Coolrland Kitchen

## 2022-04-26 ENCOUNTER — Ambulatory Visit: Admit: 2022-04-27 | Discharge: 2022-04-27 | Attending: MD | Primary: MD

## 2022-04-26 DIAGNOSIS — Z09 Encounter for follow-up examination after completed treatment for conditions other than malignant neoplasm: Secondary | ICD-10-CM

## 2022-04-26 LAB — HIV 1,2 AB/AG SCREEN
HIV antigen: NEGATIVE
HIV-1, 2 Ab Screen (Rapid): NEGATIVE

## 2022-04-26 LAB — HEPATITIS B SURFACE ANTIBODY (SLM): Hepatitis B Surface Antibody Interpretation: POSITIVE — AB

## 2022-04-26 LAB — HEPATITIS C VIRUS ANTIBODY W/ REFLEX HCV RNA PCR (SLM): Hepatitis C Virus Antibody Interpretation: NONREACTIVE

## 2022-04-26 LAB — ALT: ALT - Alanine Aminotransferase: 13 IU/L (ref 5–33)

## 2022-04-26 NOTE — Progress Notes (Signed)
Rachel [name] is a 37 y.o. female, DOB 09/12/1985, who presents today for a postoperative exam.      History of Present Illness  HPI   Rachel [name] presents for a postoperative exam.  On 02/20/22, she had a laparoscopic assisted vaginal hysterectomy, bilateral salpingectomy, left oophorectomy, and a cystoscopy. ?The pathology from her procedure was benign. ?She denies fevers, chills, abnormal vaginal discharge, or bleeding.  She also denies any postoperative pain at this point.       Review of Systems     Constitutional: ?Denies fevers or chills  Neurologic: ?Denies headaches or neuropathy  CV: ?Denies chest pain or palpitations  PULM: ?Denies shortness of breath or cough  GI: ?Denies diarrhea or constipation  GU: ?Denies dysuria or hematuria  Ext: ?Denies any musculoskeletal issues    Vitals:    04/26/22 1642   BP: 114/84   Pulse: 78   Temp: 36.8 ?C (98.2 ?F)   SpO2: 96%    Body mass index is 29.79 kg/m?Marland Kitchen  Physical Exam    General: ?NAD  CV: ?RRR  Abdomen: ?Soft, nondistended?nontender  Ext: ?No calf pain or swelling  External genitalia: ?Normal appearance, no lesions  Urethral meatus: ?Normal size and location, no lesions  Urethra: ?No masses or tenderness  Vagina: ?No abnormal discharge, cuff is completely healed  Cervix: ?Surgically absent?  Uterus: ?Surgically absent    ASSESSMENT & PLAN:       ICD-10-CM    1. Postoperative examination  Z09         1.  Postoperative Exam - [name] has healed well from her hysterectomy.  She is now cleared for all activities.  She will RTC as needed.

## 2022-04-26 NOTE — Progress Notes (Signed)
Pt here for 9weeks post op visit. Pt denies any fevers or chills, bleeding, abnormal discharge. Pt reports no issues with constipation or urination.       Examination chaperoned by Celesta Gentile.

## 2022-05-17 ENCOUNTER — Inpatient Hospital Stay: Admit: 2022-05-17 | Attending: MD | Primary: MD

## 2022-05-17 DIAGNOSIS — M25532 Pain in left wrist: Secondary | ICD-10-CM

## 2022-05-17 NOTE — Telephone Encounter (Signed)
-----   Message from JessicBuckheadlark sent at 05/17/2022  2:26 PM PST -----  Regarding: Hormones  Contact: [phone]  Hey     I was wondering if there is any way we could just check and make sure my estrogen and all those hormones are where they need to be?? I just have been extremely tired the last two weeks, moody, and I have gained 10lbs with no change since having my hysterectomy.     Rachel [name]

## 2022-05-18 ENCOUNTER — Inpatient Hospital Stay: Attending: MD | Primary: MD

## 2022-05-18 ENCOUNTER — Inpatient Hospital Stay: Admit: 2022-05-18 | Discharge: 2022-06-13 | Attending: MD | Primary: MD

## 2022-05-18 DIAGNOSIS — M461 Sacroiliitis, not elsewhere classified: Secondary | ICD-10-CM

## 2022-05-18 MED ORDER — ROPivacaine (PF) (NAROPIN) 2 mg/mL (0.2 %) injection 4 mL
2 | Freq: Once | INTRAMUSCULAR | Status: DC
Start: 2022-05-18 — End: 2022-05-19

## 2022-05-18 MED ORDER — lidocaine 10 mg/mL (1 %) solution 8 mL
10 | Freq: Once | INTRAMUSCULAR | Status: AC
Start: 2022-05-18 — End: 2022-05-18
  Administered 2022-05-18: 18:00:00 10 mL via SUBCUTANEOUS

## 2022-05-18 MED ORDER — sodium bicarbonate 4.2 % (0.5 mEq/mL) injection 2 mEq
4.2 | Freq: Once | INTRAVENOUS | Status: AC
Start: 2022-05-18 — End: 2022-05-18
  Administered 2022-05-18: 18:00:00 4.2 meq via SUBCUTANEOUS

## 2022-05-18 MED ORDER — triamcinolone acetonide (KENALOG-40) injection 40 mg
40 | Freq: Once | INTRAMUSCULAR | Status: DC
Start: 2022-05-18 — End: 2022-05-19

## 2022-05-18 NOTE — Telephone Encounter (Signed)
I will check Rachel [name]'s hormone levels.

## 2022-05-21 ENCOUNTER — Other Ambulatory Visit: Admit: 2022-05-21 | Discharge: 2022-05-21 | Primary: MD

## 2022-05-21 DIAGNOSIS — R5383 Other fatigue: Secondary | ICD-10-CM

## 2022-05-21 LAB — ANTIMULLERIAN HORMONE, SERUM (MAYO): Antimullerian Hormone, S: 2.5 ng/mL (ref 0.58–8.1)

## 2022-05-21 LAB — PROGESTERONE: Progesterone Result: <0.2 ng/mL

## 2022-05-21 LAB — TESTOSTERONE, TOTAL AND FREE, SERUM (MAYO)
Testosterone, Free: 0.31 ng/dL (ref 0.13–1.03)
Testosterone, Total: 15 ng/dL (ref 8–60)

## 2022-05-21 LAB — ESTRADIOL: Estradiol: 100 pg/mL

## 2022-05-21 LAB — FOLLICLE STIMULATING HORMONE: FSH: 4.3 m[IU]/mL

## 2022-05-29 NOTE — Telephone Encounter (Signed)
-----   Message from Conni Elliotton, MD sent at 05/26/2022  3:06 PM PDT -----  Please notify Rachel [name] that her progesterone level was a little low, but all her other hormone levels (FSH, estradiol, testosterone, AMH) were normal.

## 2022-05-29 NOTE — Telephone Encounter (Signed)
Called pt to notify of results below, left message to call clinic back.

## 2022-05-30 NOTE — Telephone Encounter (Signed)
Pt notified of results below, pt states understanding. Pt would like to know if Dr.Barton would prescribe a low progesterone Rx has she is having the symptoms of low progesterone. Pt states waking up with a brain fog and feeling tired, weight gain in the last 6 months. Let pt know that Dr.Barton is in surgery at this time but I would send him a message for when he gets back. Pt states understanding.

## 2022-05-31 NOTE — Telephone Encounter (Signed)
Patient calling in to see if Dr. Laurence Compton had read her message from yesterday.  I do not see anything but will put another note in and let them know that she is expecting a call. jh

## 2022-05-31 NOTE — Telephone Encounter (Signed)
I called [facility] and answered all her questions about her labs.  I also informed her that her testosterone and AMH levels do not support a diagnosis of PCOS anymore.  I would not attribute her weight gain to her progesterone level being low.  I recommended focusing on a healthy diet and exercise.

## 2022-06-01 ENCOUNTER — Encounter: Attending: PT | Primary: MD

## 2022-06-12 NOTE — Addendum Note (Signed)
Addended by: Tori Milks on: 06/12/2022 09:05 AM     Modules accepted: Orders

## 2022-06-14 ENCOUNTER — Institutional Professional Consult (permissible substitution): Attending: PT | Primary: MD

## 2022-06-14 MED ORDER — phentermine 15 mg capsule
15 | ORAL_CAPSULE | Freq: Every morning | ORAL | 3 refills | Status: AC
Start: 2022-06-14 — End: 2022-07-14

## 2022-06-22 ENCOUNTER — Encounter: Attending: PT | Primary: MD

## 2022-07-03 ENCOUNTER — Encounter: Attending: PT | Primary: MD

## 2022-07-05 ENCOUNTER — Inpatient Hospital Stay: Admit: 2022-07-05 | Discharge: 2022-07-26 | Attending: MD | Primary: MD

## 2022-07-05 ENCOUNTER — Encounter: Attending: PT | Primary: MD

## 2022-07-05 DIAGNOSIS — M25532 Pain in left wrist: Secondary | ICD-10-CM

## 2022-07-05 DIAGNOSIS — M67432 Ganglion, left wrist: Secondary | ICD-10-CM

## 2022-07-05 MED ORDER — gadobutroL (GADAVIST) injection 8.5 mL
10 | Freq: Once | INTRAVENOUS | Status: AC
Start: 2022-07-05 — End: 2022-07-05
  Administered 2022-07-05: 15:00:00 10 mL via INTRAVENOUS

## 2022-07-12 ENCOUNTER — Encounter: Attending: PT | Primary: MD

## 2022-07-12 NOTE — Addendum Note (Signed)
Addended by: Rolene Course on: 07/12/2022 11:22 AM     Modules accepted: Orders

## 2022-07-13 ENCOUNTER — Encounter: Attending: PT | Primary: MD

## 2022-07-19 ENCOUNTER — Institutional Professional Consult (permissible substitution): Admit: 2022-07-19 | Discharge: 2022-07-19 | Attending: PT | Primary: MD

## 2022-07-19 ENCOUNTER — Encounter: Attending: PT | Primary: MD

## 2022-07-19 DIAGNOSIS — N8189 Other female genital prolapse: Secondary | ICD-10-CM

## 2022-07-19 NOTE — Other (Signed)
[facility]  Rehabilitation Services on [place]    [address]  [place], [place]  [address]  Dept. [phone]  BJY [phone]    PT Evaluation  Patient name: Rachel [name] Referring Provider: Ok Anis, MD   Date of birth: [birth year] PCP: Veleta Miners, MD   MRN: [id number]      Gender:  female Referral Date: 05/18/22   Insurance: [facility]  Primary Referral Dx: Pelvic floor weakness in female [phone])   Treatment Dx: Muscle Weakness; Pain in-Pelvic; Pain in-Low Back; Pain in-Hip      Visits from [facility]: 1         Chief Complaint: Urinary Incontinence, LBP, L groin pain       Precautions:   General precautions: None     ASSESSMENT:   Shakiyla presents to physical therapy with stress incontinence, low back and groin pain. She demonstrates increased constipation, increased pain with passive accessory motions of her upper lumbar spine and increased sensitivity to left deep pelvic/hip musculature; all is likely contributing to overall presentation. Kinsli will greatly benefit from PT interventions to address limitation.    Kynslei's listed co-mobidity that may hinder PT prognosis includes 1st deg AV block. Due to one co-morbidity she is deemed a low complexity patient caseload.     PLAN:   Patient will benefit from PT to improve pelvic floor and deepcore muscle strength, learning to coordinate pelvic brace with daily activities, bladder retraining, and decrease pelvic pain.  Modalities to include neuromuscular re-education, therapeutic exercise, self-care/home management training, and manual therapy.    SUBJECTIVE:     Relevant Medical History:  Reports that she has had urinary issues since first child. Worse with all kids. Pain started after having last two kids closer in age. Tried of steroid injection in L SIJ with no help in symptoms. History of extensive endo, hyster Dec 2023(which didn't help pain)    Obstetric History:  G7,P4. 2008: episiotomy, 2017 nerve pain in pelvic region this did  get better after hyster. 2021, 2022     Menstrual History:  Hyster Dec 2023    Pain:  Highest Pain: 10/10, Current Pain: 6/10, Lowest Pain 3/10. Describes increased low back pain and L groin pain. Describes pain as pulling/ache/pressure. Aggravating factors include movement. Easing factors include stretching.     Bladder Symptoms:  Estimates she urinates a lot times per day when she is working (large water intake) and 0-1 times at night.  No difficulty starting stream.  Yes able to stop urine flow intentionally. No  c/o sense of pelvic heaviness/pressure/falling out. Moderate amounts of UI occurs with coughing, sneezing, laughing, jumping, bending, lifting, exercise.  Leakage occurs 2-3x/week. Affects her 4-5/10    Bowel Symptoms:  Describes 3 BMs per week.Type 2-3. Thinks that her bowels may be slower, does strain to have BM. Has been using senna everyday.     Sexual Symptoms:  Sexually active.  No STD's.  No longer has pain with intercourse, better since hyster.  No history of abuse.    Social History/CLF:  ED RN, does workout videos for exercise.     Pain Level:    See above     Patient/Family Goal:   jumping jacks PSFS: 1-2/10      OBJECTIVE:   Patient offered chaperone during today's sensitive visit and declined    Standing:  ? Lumbar AROM screen:  ? Flexion: Able to touch toes, increase in L groin pain   ? Extension:Past neutral,  biased away from left, increased B LBP   ? Side Bend: WNL     Load Transfer Testing:  ? Modified Trendelenburg load transfer test: B weakness, L sided had increased low back pressure     Abdominal Exam:  ? Observation:  C-birht scar slightly limited in all planes of motion, no increase in her pain but does endorse tenderness through scar   ? Diastasis Recti:    ? 4.5 cm Above: Decreased integrity in linea alba, no doming with head lift   ? 4.5 cm Below: Improved integrity compared to superior but still some bogginess   ? Skin/Fascia:  WNL  ? Tender points to palpation:  Tenderness with mobility of Cbirth scar   ? Transverse abdominus recruitment:  Present   ? Breathing pattern:  Accessory     Pubic Symphysis Scan:  (Modified Trendelenburg-above)  ? Palpation:  No diastasis no tenderness   ? Tender points in: L adductor feels tight   ? Squeeze Test: Positive, some pain through bilateral groin.      Pelvic Girdle Pain Provocation Tests:  ? Posterior Pelvic Pain Provocation Test (P4): B-    Hip Scan:  ? Internal Rotation Over Pressure (IROP) Test: L+, R-   ? Flexion Adduction Internal Rotation (FADDIR) Test: L+, R-   ? Resisted External Derotation Test (Lateral Hip): L+, R-     Joint Mobility  ? Recreation of L sided back and L sided groin pain with Grade III central PAs targeting L1/L2. Stiffness and localized pressure the rest of the lumbar spine with central PAs   ?   Vaginal Exam:  ? Observation:  WNL  ? Sensation and Reflex Testing:  Intact bilaterally with light touch sensation through sacral dermatomes, slightly less on the Left side   ? Anal Wink:  Present   ? Perineal Body Mobility and Descent Testing:   ? Voluntary PFM Contraction: Present  ? Voluntary PFM Relaxation: Present  ? Involunatry PFM Contraction/Cough Reflex:Absent   ? Involuntary PFM Relaxation: Present  ? Superficial PFM palpation: No pain or tenderness bilaterally with external palpation   ? Levator Ani Palpation: Increased resting tone and pain with palpation to L side   ? Obturator Internus palpation: Increased resting tone and pain with palpation to L side   ? Arcus Tendineus Levator Ani (ALTA) palpation: Increased resting tone and pain with palpation to L side   ? Palpation of Coccyx: UTA   ? Laycock PERFECT Scale:  ? Power: R 4/5, L 4/5   ? Endurance: Hold Time: 10  ? Repetitions: Number of Repetitions:3+  ? Fast twitch: Number of 1 second contractions performed in 10 seconds: 10  ? Elevation: Present or Absent: Present   ? Co-contraction: Present   ? Timing: Present or Absent: Absent     ? Prolapse  Testing:   ? Anterior: Did not detect   ? Posterior: Did not detect   ? Apical: did not detect     Treatment:   -Education regarding relative pelvic anatomy and physiology  -Education regarding POC  -Education regarding importance of increased fiber.      PT Treatment Goals  Short Term Goals   STG Outcomes   STG 1: Independent HEP  Outcome 1: New    STG 2: No pain with internal work  Outcome 2: New    STG 3: Demonstrate ability to manage IAP with functional tasks  Outcome 3: New    STG 4: More Type 4 stools  Outcome 4:  New          Long Term Goals  LTG Outcomes   LTG 1: Reduce/eliminate episodes of incontinence Outcome 1: New    LTG 2: Demonstrate full range of motion of pelvic floor musculature Outcome 2: New    LTG 3: Centralization of symptoms  Outcome 3: New    LTG 4: Highest pain in one week: 3/10  Outcome 4: New    LTG 5: Self management of symptoms  Outcome 5: New   Goals Reporting Period: From: 07/19/22  To: 10/17/22      Justification for Care:  Pt. will require skilled therapy intervention to achieve the goals listed above.           Rehab Potential: Good  Treatment Interventions CPT codes: Gait Training [address]; Mobilization/Manual Therapy 97140; Therapeutic exercises/Home Program [phone]; Neuromuscular re-education/Balance 09811; Therapeutic activities 97530; ADL Training 91478; Core Stabilization Program [phone]; Elec. Stim- attended 13086; HP/CP/Ice [phone]; Pelvic Floor Muscle Retraining 97110; Postural Training/ Body Mechanics [id number]; Orthotic management and training [phone]  General precautions: None     Frequency: # visits per week: 1   Duration: # calendar days: 90,  or sooner if discharge criteria are met   Certification Period: From: 07/19/22  Certified to (Recert or Discharge due): 10/17/22   Thank you for this referral.  Continue PT per established plan of care.     H & P Reviewed. Goals to be reviewed with patient/caregiver at next treatment appt. and have been integrated into the rehab treatment  plan.      Please sign below if you agree with the provided plan and return to the fax number listed above. Modifications/precautions are appreciated.      I certify the need for these services furnished under this plan of treatment and while under my care.     ______________________________________________________________________  (Provider signature)    ____________ ____________  (Date)   (Time)    (Patient Name: Rachel [name])      Denita Lun, PT, DPT, WCS   (TOTAL minutes: 45 minutes)

## 2022-07-21 ENCOUNTER — Encounter: Attending: PT | Primary: MD

## 2022-07-25 MED ORDER — phentermine (ADIPEX-P) 37.5 mg tablet
37.5 | ORAL_TABLET | Freq: Every morning | ORAL | 3 refills | Status: DC
Start: 2022-07-25 — End: 2022-07-26

## 2022-07-25 NOTE — Telephone Encounter (Signed)
Shelby WKentuckyilson, MA 5/14/2Jana HakimM PDT    Please review and refill, if appropriate.  ----- Message -----  FroVeleta Minersnzales, MD  Sent: 07/25/2022 12:49 PM PDT  To: [facility] Welln[facility]  Subject: FW: Weight loss     Please ask Jon to print a script for phentermine 37.5 mg QAM #28 R 3 thank you.    ----- Message -----  Jana Hakimby Wilson, MA  Sent: 07/24/2022 8:26 AM PDTVeleta Miners Gonzales, MD  Subject: FW: Weight loss     Please advise  ----- Message -----  From: Geralynn Ochs  Sent: 07/21/2022 2:27 PM PDT  To: [facility] Raneshia Derick[facility] Staff  Subject: Weight loss     Hey, I was going to try and refill the phentermine, did you want to do the increase this script?? Just wanted to make sure before I pick up the refill.     Shanda Bumps

## 2022-07-26 MED ORDER — phentermine (ADIPEX-P) 37.5 mg tablet
37.5 | ORAL_TABLET | Freq: Every morning | ORAL | 3 refills | Status: AC
Start: 2022-07-26 — End: 2022-11-15

## 2022-07-26 NOTE — Progress Notes (Signed)
Replacing Rx for phentermine at PCP request after duo downtime inhibiting electronic prescribing.

## 2022-08-02 ENCOUNTER — Encounter: Attending: PT | Primary: MD

## 2022-08-03 ENCOUNTER — Encounter: Attending: PT | Primary: MD

## 2022-08-09 ENCOUNTER — Encounter: Attending: PT | Primary: MD

## 2022-08-10 ENCOUNTER — Encounter: Attending: PT | Primary: MD

## 2022-08-16 ENCOUNTER — Encounter: Attending: PT | Primary: MD

## 2022-08-21 ENCOUNTER — Other Ambulatory Visit: Admit: 2022-08-21 | Discharge: 2022-08-21 | Primary: MD

## 2022-08-21 DIAGNOSIS — M654 Radial styloid tenosynovitis [de Quervain]: Secondary | ICD-10-CM

## 2022-08-21 LAB — BASIC METABOLIC PANEL
Anion Gap: 9.7 mmol/L (ref 9.0–18.0)
BUN / Creatinine Ratio: 16.2 (ref 12.0–20.0)
BUN: 11 mg/dL (ref 8–20)
CO2 - Carbon Dioxide: 25 mmol/L (ref 17–27)
Calcium: 8.9 mg/dL (ref 8.6–10.6)
Chloride: 103.3 mmol/L (ref 101.0–111.0)
Creatinine: 0.68 mg/dL (ref 0.60–1.30)
Glomerular Filtration Rate Estimate (Female): >90 mL/min/{1.73_m2} (ref >=60)
Glucose: 82 mg/dL (ref 74–106)
Osmolality Calculation: 274.2 mOsm/kg — ABNORMAL LOW (ref 275.0–300.0)
Potassium: 4.43 mmol/L (ref 3.50–5.10)
Sodium: 138 mmol/L (ref 135–145)

## 2022-08-21 LAB — ANA PATH REVIEW: ANA (Result, Dilution, Titer, Pattern): NEGATIVE

## 2022-08-21 LAB — HLA-B27 ANTIGEN: HLA B27: POSITIVE

## 2022-08-21 LAB — CYCLIC CITRULLINATED PEPTIDE (CCP), ANTIBODY: Cyclic Citrullinated Peptide: <15.6 U

## 2022-08-21 LAB — ANA: ANA Screen: NEGATIVE

## 2022-08-21 LAB — RHEUMATOID FACTOR, SERUM: Rheumatoid Factor, Serum Result: <15 IU/mL (ref <15)

## 2022-08-23 ENCOUNTER — Encounter: Attending: PT | Primary: MD

## 2022-08-31 ENCOUNTER — Encounter: Attending: PT | Primary: MD

## 2022-09-07 ENCOUNTER — Institutional Professional Consult (permissible substitution): Admit: 2022-09-07 | Discharge: 2022-09-07 | Attending: PT | Primary: MD

## 2022-09-07 DIAGNOSIS — N8189 Other female genital prolapse: Secondary | ICD-10-CM

## 2022-09-07 NOTE — Treatment Summary (Signed)
PT Treatment Note    Patient name: Rachel [name] Progress Note Due:  10/17/22   Date of birth: [birth year] Visits from [facility]: 2   Primary Referral Dx: Pelvic floor weakness in female (N81.89)   Treatment Dx: Muscle Weakness; Pain in-Pelvic; Pain in-Low Back; Pain in-Hip      # Auth Visits approved: 12     Date: 09/07/22 Time:  1500 -      Precautions:   General precautions: None     SUBJECTIVE:  No changes from evaluation. Does report that she has increased pelvic pain when she is more constipated.    Pain Level:    Average pain last few days: moderate     OBJECTIVE:     Treatment:    ThereAct:  -Discussion regarding constipation management. Gave handout regarding fiber facts.   -Discussion regarding proper defecation posture. Encouraged stool with management of IAP    Manual: 25 min  -Instruction/administration of ILU massage. Gave handout for home use.  -STM/MFR to deep pelvic musculature using pelvic wand. Taught indirect vs direct mobilization of tissue. Used hip flexion, ER, etc to mobilize tissue. Encouraged to try in different positions with goal of controlling pain.    ASSESSMENT:    Rachel [name] demonstrates increased restriction throughout her deep pelvis with most restriction through the left side. Discussed and trialed use of pelvic wand to help with STM. Encouraged to try to track and add more fiber to diet in order to have softer, more productive bowel movements. Rachel [name] will continue to benefit from PT interventions.     PT Treatment Goals  Short Term Goals  STG Outcomes   STG 1: Independent HEP  Outcome 1: Continued    STG 2: No pain with internal work  Outcome 2: Continued    STG 3: Demonstrate ability to manage IAP with functional tasks  Outcome 3: Continued    STG 4: More Type 4 stools  Outcome 4: Continued          Long Term Goals  LTG Outcomes   LTG 1: Reduce/eliminate episodes of incontinence Outcome 1: Continued    LTG 2: Demonstrate full range of motion of pelvic floor musculature Outcome 2:  Continued    LTG 3: Centralization of symptoms  Outcome 3: Continued    LTG 4: Highest pain in one week: 3/10  Outcome 4: Continued    LTG 5: Self management of symptoms  Outcome 5: Continued   Goals Reporting Period: From: 07/19/22  To: 10/17/22    These goals have been integrated into the current treatment plan and Rachel [name] continues to demonstrate limitations which require skilled PT intervention.    PLAN:   Full range PFM, hip strength, etc.      Madden Piazza, PT, DPT, WCS   (Skilled/ TOTAL minutes: 45 minutes)

## 2022-09-13 ENCOUNTER — Encounter: Attending: PT | Primary: MD

## 2022-09-13 DIAGNOSIS — M461 Sacroiliitis, not elsewhere classified: Secondary | ICD-10-CM

## 2022-09-13 NOTE — Other (Signed)
Please call the patient with the following plan regarding these lab results:    Patient does have some mild disc bulging in the lower part of her spine but thankfully no spinal canal or foraminal stenosis.  These would usually result in symptoms of low back pain but no nerve pain shooting down the legs.  Treatment would include physical therapy.  NSAIDs, muscle relaxants, rest.  Could potentially discuss with orthopedic surgery if not improving over time.  Please place referral for physical therapy to location of patient's choice.

## 2022-09-14 ENCOUNTER — Inpatient Hospital Stay: Admit: 2022-09-14 | Discharge: 2022-09-27 | Payer: PRIVATE HEALTH INSURANCE | Attending: MD | Primary: MD

## 2022-09-20 ENCOUNTER — Encounter: Attending: PT | Primary: MD

## 2022-09-20 NOTE — Telephone Encounter (Signed)
Contacted patient regarding lab results. Relayed results below. Patient understood and had no questions.

## 2022-09-20 NOTE — Telephone Encounter (Signed)
-----   Message from [place], [place] sent at 09/19/2022  4:20 PM PDT -----    ----- Message -----  From: Luretha Bieber, MD  Sent: 09/19/2022   3:46 PM PDT  To: [facility]    Please call the patient with the following plan regarding these lab results:    Patient does have some mild disc bulging in the lower part of her spine but thankfully no spinal canal or foraminal stenosis.  These would usually result in symptoms of low back pain but no nerve pain shooting down the legs.  Treatment would include physical therapy.  NSAIDs, muscle relaxants, rest.  Could potentially discuss with orthopedic surgery if not improving over time.  Please place referral for physical therapy to location of patient's choice.

## 2022-09-27 ENCOUNTER — Encounter: Attending: PT | Primary: MD

## 2022-09-29 MED ORDER — buPROPion (WELLBUTRIN) 75 mg tablet
75 | ORAL_TABLET | Freq: Two times a day (BID) | ORAL | 3 refills | 90.00000 days | Status: DC
Start: 2022-09-29 — End: 2022-12-13

## 2022-10-04 ENCOUNTER — Encounter: Attending: PT | Primary: MD

## 2022-10-12 ENCOUNTER — Institutional Professional Consult (permissible substitution): Admit: 2022-10-12 | Discharge: 2022-10-12 | Attending: PT | Primary: MD

## 2022-10-12 DIAGNOSIS — M461 Sacroiliitis, not elsewhere classified: Secondary | ICD-10-CM
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
## 2022-10-19 ENCOUNTER — Encounter: Admit: 2022-10-19 | Discharge: 2022-10-19 | Attending: PT | Primary: MD

## 2022-10-19 NOTE — Treatment Summary (Signed)
PT Treatment Note    Patient name: Laureli Grondahl Progress Note Due:  01/10/23   Date of birth: 07-Jan-1986 Visits from [facility]: 2   Primary/Referral Dx: Sacroiliitis (HCC) (M46.1), Lumbar back pain (M54.50)  Treatment Dx: Pain in-Low Back       Current auth visit #:  12  No data recorded  No data recorded   Date: 10/19/2022     Time:  1330 - 1430     Precautions:   General precautions: None     ASSESSMENT:   Pt is a 37 y/o female presenting to PT for low back and groin pain L side. She had fair tolerance for the session today but did have some aggravation of L sided SIJ area pain at the end of the session. We focused on hip flexor release and stretching today then introduced 2 new strengthening exercises for HEP. Plan to perform more global hip strengthening, instruct in standing hip flexor stretching, and trial adductor STM next visit    SUBJECTIVE:  Pt reports she worked on the glute isometric activation in prone at home and it was difficult. Feels like the R side has more difficulty than the L. No new issues to report since IE.    OBJECTIVE:   Pt had reproduction of her familiar groin pain with palpation of iliacus mm L side. Also had some groin pain with L sided adductor stretch. Both hypertonic. Improved glute isolation    Treatment:  Manual therpay 10 min, 1 unit. Therex 35 min 2 units, Neuromuscular re-ed 15 min 1 unit  - Upright bike 5 min lvl 7 during subjective collection  - Prone glute activation unilateral isolation w/ tactile feedback. Progressed to Glute isometric squeeze into prone hip extension w/ cues for same.   - Hip flexor release bilat w/ focus on L side: active release w/ 2 sec SLR hold x 10 bilat. 10 min total  - C-R hip flexor stretching in Thomas test position instruction for HEP.  - Standing marching w/ lvl 2 band 2 x 10  - Instruction in standing adductor stretch   - Supine adductor squeeze w/ FR 10 x 10 sec holds      PT Treatment Goals  Short Term Goals   STG Outcomes   STG 1:  PT will  demonstrate ind with initial HEP within 1 week  New   STG 2: Pt will report consistent sx reduction with established HEP within 2 weeks New   STG 3: Pt will demonstrate appropriate hip hinge form without increase in pain or sx New   Long Term Goals      LTG 1: Pt will demonstrate full back ROM without sx prov New   LTG 2:  Pt will demonstrate functional Improvement as measured with pain scale during events of prolonged sitting New   LTG 3: Pt will report ind with adv HEP for long term progression.  New   LTG 4: Pt will be able to lift 85 lbs from floor to waist without increase in spinal pain New     PLAN:   Hip strengthening in all planes,      Shannan Harper, PT  (Skilled/ TOTAL minutes: 60 minutes)

## 2022-10-25 ENCOUNTER — Encounter: Attending: PT | Primary: MD

## 2022-10-27 ENCOUNTER — Encounter: Attending: PTA | Primary: MD

## 2022-11-01 ENCOUNTER — Encounter: Primary: MD

## 2022-11-01 ENCOUNTER — Encounter: Attending: PTA | Primary: MD

## 2022-11-03 ENCOUNTER — Encounter: Primary: MD

## 2022-11-03 ENCOUNTER — Encounter: Attending: PTA | Primary: MD

## 2022-11-08 ENCOUNTER — Encounter: Attending: PT | Primary: MD

## 2022-11-10 ENCOUNTER — Encounter: Attending: PTA | Primary: MD

## 2022-11-10 ENCOUNTER — Encounter: Primary: MD

## 2022-11-15 ENCOUNTER — Encounter: Primary: MD

## 2022-11-15 ENCOUNTER — Encounter: Attending: PT | Primary: MD

## 2022-11-21 MED ORDER — modafiniL (PROVIGIL) 100 mg tablet
100 | ORAL_TABLET | Freq: Every day | ORAL | 2 refills | Status: DC
Start: 2022-11-21 — End: 2023-01-31

## 2022-11-21 NOTE — Telephone Encounter (Signed)
Requested Prescriptions     Pending Prescriptions Disp Refills    modafiniL (PROVIGIL) 100 mg tablet 30 tablet 2     Sig: Take 1 tablet by mouth once daily.        Medications(s) to be sent to: [facility]  Medication(s) last filled on: 12/20/21    Contract on file: No contract    Last UDS:   Last OV: 03/28/2022 Rolena Infante, FNP   Future Appointments   Date Time Provider Department Center   12/13/2022  1:40 PM Veleta Miners, MD [facility]   02/15/2023  9:15 AM Joslyn Hy, MD [facility]   02/21/2023  8:40 AM Otho Ket, MD [facility]        Patient has a physical address on file.     Tina Temme [phone]

## 2022-11-22 ENCOUNTER — Encounter: Attending: PT | Primary: MD

## 2022-11-22 ENCOUNTER — Encounter: Primary: MD

## 2022-11-24 ENCOUNTER — Other Ambulatory Visit: Admit: 2022-11-24 | Discharge: 2022-11-24 | Primary: MD

## 2022-11-24 ENCOUNTER — Encounter: Primary: MD

## 2022-11-24 DIAGNOSIS — Z130 Encounter for screening for diseases of the blood and blood-forming organs and certain disorders involving the immune mechanism: Secondary | ICD-10-CM

## 2022-11-24 DIAGNOSIS — L659 Nonscarring hair loss, unspecified: Secondary | ICD-10-CM

## 2022-11-24 LAB — TSH W/ REFLEX FREE T4: TSH - Thyroid Stimulating Hormone: 1.61 ÂµIU/mL (ref 0.34–5.60)

## 2022-11-24 LAB — IRON AND TIBC
Iron Saturation: 19 % — ABNORMAL LOW (ref 20–55)
Iron: 69 ug/dL (ref 28–170)
Total Iron Binding Capacity (Transferrin Saturation): 362 ug/dL (ref 261–487)
Transferrin: 253 mg/dL (ref 192–382)

## 2022-11-24 LAB — CBC WITH AUTO DIFFERENTIAL
Basophils %: 0 % (ref 0–2)
Basophils, Absolute: 0 10*3/ÂµL (ref 0.0–0.1)
Eosinophils %: 1 % (ref 0–5)
Eosinophils, Absolute: 0.1 10*3/ÂµL (ref 0.0–0.4)
HCT: 40.8 % (ref 35.0–48.0)
Hemoglobin: 14 g/dL (ref 11.7–16.5)
Lymphocytes %: 23 % (ref 20–40)
Lymphocytes, Absolute: 2.2 10*3/ÂµL (ref 1.0–4.0)
MCH: 29.8 pg (ref 28.3–33.3)
MCHC: 34.2 g/dL (ref 32.5–36.0)
MCV: 87 fL (ref 81.0–100.0)
MPV: 7.6 fL (ref 6.9–10.0)
Monocytes %: 9 % (ref 2–12)
Monocytes, Absolute: 0.8 10*3/ÂµL (ref 0.2–1.0)
Neutrophils %: 67 % (ref 50–74)
Neutrophils, Absolute: 6.5 10*3/ÂµL (ref 2.0–7.4)
Platelet Count: 174 10*3/ÂµL (ref 150–405)
RBC: 4.68 10*6/ÂµL (ref 3.80–5.60)
RDW: 12.4 % (ref 11.7–16.1)
WBC: 9.6 10*3/ÂµL (ref 4.5–11.0)

## 2022-11-24 LAB — FERRITIN: Ferritin: 107 ng/mL (ref 13–150)

## 2022-11-27 NOTE — Telephone Encounter (Signed)
-----   Message from Veleta Miners, MD sent at 11/27/2022  8:57 AM PDT -----  Labs all normal, could use a little more iron if it doesn't cause issues with constipation but technically not deficient and ferritin ok. Can talk more at upcoming visit.

## 2022-11-27 NOTE — Telephone Encounter (Signed)
Patient was advised of message below and verbalized understanding.     Karn Cassis, LPN

## 2022-11-29 ENCOUNTER — Encounter: Primary: MD

## 2022-12-01 ENCOUNTER — Encounter: Primary: MD

## 2022-12-05 ENCOUNTER — Other Ambulatory Visit: Primary: MD

## 2022-12-05 LAB — SARS-COV-2 (COVID-19), DIAGNOSTIC: SARS-CoV-2 (COVID-19) by RT-PCR: NEGATIVE

## 2022-12-13 ENCOUNTER — Encounter: Primary: MD

## 2022-12-13 ENCOUNTER — Encounter: Attending: MD | Primary: MD

## 2022-12-13 DIAGNOSIS — N644 Mastodynia: Secondary | ICD-10-CM

## 2022-12-13 MED ORDER — buPROPion (WELLBUTRIN) 100 mg tablet
100 | ORAL_TABLET | Freq: Two times a day (BID) | ORAL | 3 refills | 90.00000 days | Status: DC
Start: 2022-12-13 — End: 2023-03-06

## 2022-12-13 NOTE — Progress Notes (Signed)
OFFICE VISIT    HPI  Rachel [name] is a 37 y.o. female who presents to discuss    Breast tenderness  S/p hysterectomy but does feel like breast tenderness with pregnancy  Wondering if related to recent URI  She has been coughing a little  No new exercise or bra     URI  Sick on and off for 2-3 months  Has young children but they are not sick   Works in the ER  Tested negative for COVID 12/05/22 and retested 12/11/22  Feels like she is getting sick more than normal for her  She is finally sleeping, taking Unisom to help and getting 7-8 hours of sleep  Now on day shift     Concern for ADHD  Strong family history, brother is similar to her and not a typical ADHD phenotype but just got diagnosed and is benefitting from treatment  Feels that in environments with a lot of stimulation, she feels herself amping up  She denies feelings of depression  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 180 tablet 3    docusate sodium (COLACE) 100 mg capsule Take 100 mg by mouth once daily.      modafiniL (PROVIGIL) 100 mg tablet Take 1 tablet by mouth once daily. 30 tablet 2    phentermine 37.5 mg capsule Take 37.5 mg by mouth every morning.         Allergies and past medical, family, and social history were reviewed and updated as needed.    ROS  Negative for visual changes, eye pain, ear pain, headache, dizziness, chest pain, SOB, abdominal pain, N/V, constipation, diarrhea, joint pain, swelling, or rashes other than or in addition to what is discussed above.    Physical Exam:  LMP 02/10/2022 (Exact Date)     Wt Readings from Last 5 Encounters:   04/26/22 179 lb (81.2 kg)   04/12/22 180 lb (81.6 kg)   03/28/22 176 lb 6.4 oz (80 kg)   03/22/22 178 lb 8 oz (81  kg)   03/08/22 178 lb (80.7 kg)       Gen: nontoxic appearing, no acute distress, on room air, hoarse voice  CV: RRR  Lungs: normal work of breathing on room air, no wheezing   Bilateral mild axillary tenderness without palpable lumps, patient localizes bilateral lower breast tenderness without visible erythema, nipple discharge, or palpable lesion   Neuro: AOx4, CNII-XII grossly intact, moves all extremities, otherwise nonfocal  Psych: affect appropriate, mood congruent    Assessment and Plan  1. Breast tenderness in female  Bilateral nature is reassuring against malignancy or infection. No evidence of mastitis/cellulitis. I suspect this is reactive from whatever recent viral process she has had but will get u/s for reassurance.   -     Ultrasound Breast Complete Bilateral; Future; Expected date: 12/13/2022    2. Family history of attention deficit hyperactivity disorder (ADHD)  Rachel [name] is a busy mom of 4 young kids and working full time as Char[employer] fundraiser. Her sleep has improved but she still has quite a lot to distract her focus. I asked that she undergo formal ADHD eval with [facility] before we  try pharmacotherapy.   -     Referral to [facility] (External)    3. Shift work sleep disorder  We will try increasing Wellbutrin to 100 mg BID to see if this helps her focus.   -     buPROPion (WELLBUTRIN) 100 mg tablet; Take 1 tablet by mouth 2 times daily.  Dispense: 180 tablet; Refill: 3                    Veleta Miners, MD

## 2022-12-13 NOTE — Progress Notes (Signed)
Rachel [name] is a 37 y.o. female who presents today for mood swings. Patient states that she has several family members that have been diagnosed with ADHD and she thinks that she also has it.     She has significant bilateral breast pain. She does not know if she has lumps because it is too tender to self check. She had hysterectomy in 02/2022 and she only has 1 ovary now.     Patient has had URI sx x 2 weeks. Covid negative.     Karn Cassis, LPN        /   /   /      Screening:   Depression:   Anxiety:   Substance Use:   Fall Screen: Fall Risk Screen: N/A  Cognitive Assessment:   Patient does not require a PEG score Hearing/Vision Screening: No results found.     Care Team reviewed and up-to-date.    SDOH:          Health Maintenance:  Health Maintenance Due   Topic Date Due    Pneumococcal Vaccine (1 of 2 - PCV) Never done    Tetanus Vaccine (2 - Td or Tdap) 01/05/2020    COVID-19 Vaccine (2 - 2024-25 season) 11/12/2022    Influenza Vaccine (1) 12/12/2022       MEDICATIONS ADMINISTERED-LAST 24 Hours  (last 24 hrs)           ** No medications to display **            Ambulatory Nursing Form

## 2022-12-13 NOTE — Addendum Note (Signed)
Addended by: Veleta Miners on: 12/18/2022 08:29 AM     Modules accepted: Orders

## 2022-12-14 ENCOUNTER — Inpatient Hospital Stay: Admit: 2022-12-14 | Discharge: 2022-12-29 | Payer: PRIVATE HEALTH INSURANCE | Attending: MD | Primary: MD

## 2022-12-14 DIAGNOSIS — N644 Mastodynia: Secondary | ICD-10-CM

## 2022-12-18 NOTE — Telephone Encounter (Signed)
I left a message for the patient to return my call.    [employer], CMA

## 2022-12-18 NOTE — Telephone Encounter (Signed)
Patient returned call and wants [place] to give her a referral to [facility]. Will send message to referrals.    [employer], CMA

## 2022-12-18 NOTE — Telephone Encounter (Signed)
-----   Message from Derek Jack sent at 12/13/2022  2:14 PM PDT -----  Regarding: Referral  Hello,    Unfortunately, [facility] is not currently accepting referrals at this time. Please advise if the provider and or patient would like to redirect this referral at this time.    Thank you!  Corinna Capra  [facility] Referrals Dept  (P) [phone]

## 2022-12-20 ENCOUNTER — Encounter: Primary: MD

## 2022-12-27 ENCOUNTER — Encounter: Primary: MD

## 2022-12-27 NOTE — Telephone Encounter (Signed)
Is there a palpable lump in the L breast? If not radiology is not going to be able to biopsy.

## 2022-12-27 NOTE — Telephone Encounter (Signed)
Patient calls in regarding my chart messages from a couple days ago. She states she is still very concerned about the lumps on her breasts, and she says they are painful. Support staff unavailable at time of call. Please give patient a call back when available. Thank you.    MM

## 2022-12-27 NOTE — Telephone Encounter (Signed)
I called patient and she states that she is very nervous because she has breast ca in her family and she has new pain, and lumps. Her nipple inverted about 18 months ago. She was getting tearful on the phone and said that she would just feel better is we could order her a biopsy. Please advise.     Karn Cassis, LPN

## 2022-12-28 NOTE — Telephone Encounter (Signed)
Patient calls in returning message in hopes you will try them again. Patient states she was on hold with the MA and had another call she had to take. Support staff unavailable at time of call.    Thank you.  KH

## 2022-12-28 NOTE — Telephone Encounter (Signed)
Patient calls in returning message in hopes you will try them again. Patient stated that she has been contacted by a MA but she keeps missing the call. Support staff unavailable at time of call. Please review and contact patient. Thank you.    JB

## 2022-12-29 NOTE — Telephone Encounter (Signed)
Rachel [name] states that she has a palpable lump in both breasts.  Should we do imaging or just order the biopsies?

## 2022-12-29 NOTE — Telephone Encounter (Signed)
I would recommend visit with (female) APP to determine best location for imaging referral.

## 2023-01-03 ENCOUNTER — Encounter: Primary: MD

## 2023-01-08 NOTE — Telephone Encounter (Signed)
Ginelle notified. She will come in at 3.

## 2023-01-09 ENCOUNTER — Other Ambulatory Visit: Admit: 2023-01-10 | Payer: PRIVATE HEALTH INSURANCE | Primary: MD

## 2023-01-09 ENCOUNTER — Ambulatory Visit: Admit: 2023-01-09 | Discharge: 2023-01-09 | Payer: PRIVATE HEALTH INSURANCE | Attending: PA | Primary: MD

## 2023-01-09 VITALS — BP 138/74 | HR 81 | Temp 98.50000°F | Resp 16

## 2023-01-09 DIAGNOSIS — N63 Unspecified lump in unspecified breast: Secondary | ICD-10-CM

## 2023-01-09 DIAGNOSIS — R5383 Other fatigue: Secondary | ICD-10-CM

## 2023-01-09 DIAGNOSIS — N6320 Unspecified lump in the left breast, unspecified quadrant: Secondary | ICD-10-CM

## 2023-01-09 NOTE — Progress Notes (Signed)
Rachel [name] is a 37 y.o. female who presents today for breast exam.    BP: 138/74 / Pulse: 81 / SpO2: 99 % (room air) / Temp: 36.9 ?C (98.5 ?F)    Screening:   Depression: PHQ-2: 0 (12/13/2022)  Anxiety: GAD-2: 1 (12/13/2022)  Substance Use: EtOH - EtOH SBIRT Negative (12/13/2022)  Drug - Drug SBIRT Negative (12/13/2022)  Fall Screen: Fall Risk Screen: N/A  Cognitive Assessment:   Patient does not require a PEG score Hearing/Vision Screening: No results found.     Care Team reviewed and up-to-date.    SDOH:  Food insecurity: low risk (12/13/2022)  Transportation needs: none (12/13/2022)  Housing stability: low risk (12/13/2022)    Health Maintenance:  Health Maintenance Due   Topic Date Due    Pneumococcal Vaccine (1 of 2 - PCV) Never done    Tetanus Vaccine (2 - Td or Tdap) 01/05/2020    COVID-19 Vaccine (2 - 2024-25 season) 11/12/2022    Influenza Vaccine (1) 12/12/2022       MEDICATIONS ADMINISTERED-LAST 24 Hours  (last 24 hrs)           ** No medications to display **            Ambulatory Nursing Form

## 2023-01-09 NOTE — Addendum Note (Signed)
Addended by: Gean Birchwood on: 01/11/2023 03:08 PM     Modules accepted: Orders

## 2023-01-09 NOTE — Progress Notes (Signed)
Rachel [name] is a 37 y.o. female here today for   Chief Complaint   Patient presents with    Breast Problem      HPI:      Rachel [name] is a pleasant 37 y/o female who presents for breast exam today. Was seen earlier this month due to concerns for left breast lumps. [place] was obtained that showed cysts in the left breast with recommendation for repeat US in 6 months. Pt presents today as she expresses concern about waiting this long due to extensive family history of breast cancer. She has also felt new lumps in her right breast since that [place] was done. Lastly, she notes that about 1.5 years ago after her youngest was born her left nipple inverted and she developed skin changes to the nipple. She also experiences intermittent, sharp breast pain and tenderness. She notes her OB checked hormone levels when she was 4 months postpartum (youngest is now 2) and her progesterone was low but otherwise labs were normal. Reports her OB did also mention that referral to general surgery for her breast changes was reasonable, but at the time she elected against this. She experiences a lot of fatigue and has had weight gain. Wondering if hormone levels could be related.     Current Outpatient Medications on File Prior to Visit   Medication Sig Dispense Refill    buPROPion (WELLBUTRIN) 100 mg tablet Take 1 tablet by mouth 2 times daily. 180 tablet 3    docusate sodium (COLACE) 100 mg capsule Take 100 mg by mouth once daily.      modafiniL (PROVIGIL) 100 mg tablet Take 1 tablet by mouth once daily. 30 tablet 2    phentermine 37.5 mg capsule Take 37.5 mg by mouth every morning.      [DISCONTINUED] norethindrone-ethinyl estradiol (JUNEL FE 1/20) 1 mg-20 mcg (21)/75 mg (7) per tablet Take 1 tablet by mouth daily. (Patient not taking: No sig reported) 84 tablet 3     No current facility-administered medications on file prior to visit.        ROS:  Review of Systems as noted in HPI above     Physical Exam:  Vitals:    01/09/23 1443   BP:  138/74   Pulse: 81   Resp: 16   Temp: 36.9 ?C (98.5 ?F)   SpO2: 99%    Patient/patient representative was informed of the right to and was offered a licensed medical chaperone prior to any sensitive exam such as genital, rectal, or breast exam:  Patient DECLINED a chaperone for sensitive exams  Physical Exam  Vitals and nursing note reviewed.   Constitutional:       General: She is not in acute distress.     Appearance: Normal appearance. She is normal weight. She is not toxic-appearing.   HENT:      Head: Normocephalic and atraumatic.   Cardiovascular:      Rate and Rhythm: Normal rate.   Pulmonary:      Effort: Pulmonary effort is normal.   Chest:   Breasts:     Right: Mass (2 marble sized, smooth lumps at the 9 oclock position) present. No inverted nipple, nipple discharge or skin change.      Left: Inverted nipple, mass (fibrocystic changes felt in upper outer breast) and skin change (on the areola at the 12oclock-3 oclock positon) present. No nipple discharge.   Neurological:      Mental Status: She is alert.   Psychiatric:  Mood and Affect: Mood normal.         Behavior: Behavior normal.          Assessment:  1. Breast lump in female  Comments:  right breast, two smooth, marble sized masses in upper outer quadrant, new since US done. will order mammogram to eval further and place ref to gen surgery  Orders:  -     Mammography tomo diagnostic bilateral digital w cad; Future; Expected date: 01/09/2023  -     Referral to General Surgery - [facility]ia Buff [place] ([facility])    2. FHx: breast cancer  Comments:  both maternal grandmothers, aunts. concerned about skin changes, new inverted nipple, bilateral breast masses. would like to meet with Dr. Pamala Hurry, ref placed  Orders:  -     Mammography tomo diagnostic bilateral digital w cad; Future; Expected date: 01/09/2023  -     Referral to General Surgery - [facility] Buff [place] ([facility])    3. Other fatigue  Comments:  possibly related to work schedule but pt hopes this will be changing  soon, had labs with low progesterone 1 year ago, will repeat today  Orders:  -     Cortisol -Routine; Future  -     Progesterone -Routine; Future  -     Estradiol -Routine; Future  -     Luteinizing Hormone -Routine; Future  -     Follicle Stimulating Hormone -Routine; Future    4. Weight gain  Comments:  possibly hormone related, activity level and diet are unchanged. will repeat hormones today, trend compared to last labs, will call once results.  Orders:  -     Cortisol -Routine; Future  -     Progesterone -Routine; Future  -     Estradiol -Routine; Future  -     Luteinizing Hormone -Routine; Future  -     Follicle Stimulating Hormone -Routine; Future          Return for pending imaging results.     Gean Birchwood, [place]   01/10/23    Attestation   I spent a total of 30 minutes on the day of the visit in chart review, direct patient care, and documentation.        NOTE:  This dictation was produced using voice recognition software. Although effort has been made to minimize transcription errors, homonyms and other transcription errors may be present and may not truly reflect my intent

## 2023-01-10 LAB — ESTRADIOL: Estradiol: 169 pg/mL

## 2023-01-10 LAB — LUTEINIZING HORMONE: LH: 13 m[IU]/mL

## 2023-01-10 LAB — PROGESTERONE: Progesterone Result: 0.21 ng/mL

## 2023-01-10 LAB — FOLLICLE STIMULATING HORMONE: FSH: 4.1 m[IU]/mL

## 2023-01-10 LAB — CORTISOL: Cortisol: 4.5 ug/dL

## 2023-01-11 NOTE — Telephone Encounter (Signed)
MRI order has been placed.

## 2023-01-11 NOTE — Telephone Encounter (Signed)
-----   Message from [place], [place] sent at 01/11/2023 10:41 AM PDT -----  Regarding: RE: MRI Order  Ok to change thank you!  ----- Message -----  From: Gala Romney, MA  Sent: 01/11/2023  10:13 AM PDT  To: Gean Birchwood, PA  Subject: FW: MRI Order                                    Did you want it with contrast?  ----- Message -----  From: Pincus Large  Sent: 01/11/2023  10:04 AM PDT  To: [facility] Staff  Subject: FW: MRI Order                                    Good morning,    Please see message below and place new order if provider is in agreement.    Thank you,  Glyn Ade Referrals Department    ----- Message -----  From: Nickolas Madrid  Sent: 01/11/2023   9:56 AM PDT  To: [facility] Referrals  Subject: MRI Order                                        Hello,  Please send amended order for breast MRI to include contrast.    Bilateral Breast MRI with and without contrast    Patient patient is calling to schedule.    Thanks,  French Ana

## 2023-01-11 NOTE — Addendum Note (Signed)
Addended by: Pincus Large on: 01/11/2023 09:55 AM     Modules accepted: Orders

## 2023-01-12 NOTE — Telephone Encounter (Signed)
Labs ordered, patient notified via mychart.

## 2023-01-12 NOTE — Telephone Encounter (Signed)
-----   Message from [place], [place] sent at 01/12/2023  1:44 PM PDT -----  Regarding: FW: Mammogram  Contact: [phone]  Ok to order. Also I sent her my chart messages regarding her other lab results Including progesterone and my recommendations for next steps!   ----- Message -----  From: Gala Romney, MA  Sent: 01/12/2023   1:41 PM PDT  To: Gean Birchwood, PA  Subject: FW: Mammogram                                    Ok to order?  ----- Message -----  From: Geralynn Ochs  Sent: 01/12/2023   1:04 PM PDT  To: [facility] Staff  Subject: Mammogram                                        Annabelle Harman    Would you possibly order a CBC and BMP so I have a current one just in case I need them, since I have to get my labs drawn again anyways.     Shanda Bumps

## 2023-01-13 ENCOUNTER — Other Ambulatory Visit: Admit: 2023-01-13 | Payer: PRIVATE HEALTH INSURANCE | Primary: MD

## 2023-01-13 DIAGNOSIS — R5383 Other fatigue: Secondary | ICD-10-CM

## 2023-01-13 LAB — CBC WITH AUTO DIFFERENTIAL
Basophils %: 0 % (ref 0–2)
Basophils, Absolute: 0 10*3/ÂµL (ref 0.0–0.1)
Eosinophils %: 1 % (ref 0–5)
Eosinophils, Absolute: 0 10*3/ÂµL (ref 0.0–0.4)
HCT: 41.9 % (ref 35.0–48.0)
Hemoglobin: 14.3 g/dL (ref 11.7–16.5)
Lymphocytes %: 20 % (ref 20–40)
Lymphocytes, Absolute: 1.4 10*3/ÂµL (ref 1.0–4.0)
MCH: 30.1 pg (ref 28.3–33.3)
MCHC: 34.2 g/dL (ref 32.5–36.0)
MCV: 88.1 fL (ref 81.0–100.0)
MPV: 7.7 fL (ref 6.9–10.0)
Monocytes %: 7 % (ref 2–12)
Monocytes, Absolute: 0.5 10*3/ÂµL (ref 0.2–1.0)
Neutrophils %: 72 % (ref 50–74)
Neutrophils, Absolute: 5 10*3/ÂµL (ref 2.0–7.4)
Platelet Count: 161 10*3/ÂµL (ref 150–405)
RBC: 4.76 10*6/ÂµL (ref 3.80–5.60)
RDW: 12.5 % (ref 11.7–16.1)
WBC: 6.9 10*3/ÂµL (ref 4.5–11.0)

## 2023-01-13 LAB — BASIC METABOLIC PANEL
Anion Gap: 9.1 mmol/L (ref 9.0–18.0)
BUN / Creatinine Ratio: 12.9 (ref 12.0–20.0)
BUN: 8 mg/dL (ref 8–20)
CO2 - Carbon Dioxide: 23 mmol/L (ref 17–27)
Calcium: 8.9 mg/dL (ref 8.6–10.6)
Chloride: 105.9 mmol/L (ref 101.0–111.0)
Creatinine: 0.62 mg/dL (ref 0.60–1.30)
Glomerular Filtration Rate Estimate (Female): >90 mL/min/{1.73_m2} (ref >=60)
Glucose: 85 mg/dL (ref 74–106)
Osmolality Calculation: 273.3 mosm/kg — ABNORMAL LOW (ref 275.0–300.0)
Potassium: 3.79 mmol/L (ref 3.50–5.10)
Sodium: 138 mmol/L (ref 135–145)

## 2023-01-13 LAB — PROLACTIN: Prolactin: 5.98 ng/mL

## 2023-01-17 NOTE — Telephone Encounter (Signed)
Patient notified. She is currently traveling so we kept losing each other. I will send a mychart message just in case.

## 2023-01-17 NOTE — Telephone Encounter (Signed)
-----   Message from [place] sent at 01/13/2023 10:24 AM PDT -----  Please let pt know her bmp, cbc and prolactin were normal. Prolactin in normal ranges points her other abnormal hormone levels more toward ovarian insufficiency than another cause. Recommend continuing with the pelvic US and women's health f/u. Thanks! Dsj

## 2023-01-17 NOTE — Telephone Encounter (Signed)
 Patient calls in returning message in hopes you will try them again. Support staff unavailable at time of call. Thank you.    MO

## 2023-01-19 ENCOUNTER — Encounter: Attending: Student in an Organized Health Care Education/Training Program | Primary: MD

## 2023-01-24 ENCOUNTER — Inpatient Hospital Stay: Admit: 2023-01-24 | Discharge: 2023-02-07 | Payer: PRIVATE HEALTH INSURANCE | Attending: PA | Primary: MD

## 2023-01-24 ENCOUNTER — Ambulatory Visit: Primary: MD

## 2023-01-24 ENCOUNTER — Inpatient Hospital Stay: Admit: 2023-01-24 | Payer: PRIVATE HEALTH INSURANCE | Attending: PA | Primary: MD

## 2023-01-24 DIAGNOSIS — N63 Unspecified lump in unspecified breast: Secondary | ICD-10-CM

## 2023-01-24 DIAGNOSIS — R7989 Other specified abnormal findings of blood chemistry: Secondary | ICD-10-CM

## 2023-01-24 LAB — LIPID PANEL W/ REFLEX DIRECT LDL
Chol/HDL Ratio: 2.8 (ref 0.0–5.0)
Cholesterol, HDL: 47 mg/dL — ABNORMAL LOW (ref >=60)
Cholesterol: 132 mg/dL (ref <=200)
LDL Calculated: 74 mg/dL (ref 0–129)
Triglyceride: 54.3 mg/dL (ref <=150.0)
VLDL Cholesterol Calculation: 10.9 mg/dL (ref 7.0–32.0)

## 2023-01-24 LAB — GLYCO-HEMOGLOBIN A1C: Glycohemoglobin (A1c): 5.1 % (ref <=5.6)

## 2023-01-24 MED ORDER — gadobutroL (GADAVIST) injection 7.8 mL
10 | Freq: Once | INTRAVENOUS | Status: AC
Start: 2023-01-24 — End: 2023-01-24
  Administered 2023-01-24: 17:00:00 10 mL via INTRAVENOUS

## 2023-01-24 NOTE — Other (Signed)
Labs drawn as part of [employer] Wellness Incentive enrollment.  They are essentially normal, and no changes are likely needed.  I am just making sure you are aware!    The ASCVD Risk score (Arnett DK, et al., 2019) failed to calculate.

## 2023-01-30 NOTE — Telephone Encounter (Signed)
Attempted to contact patient but the line was busy. Will try again later.

## 2023-01-30 NOTE — Telephone Encounter (Signed)
 Contacted patient regarding lab results. Relayed results below. Patient understood and had no questions.

## 2023-01-30 NOTE — Telephone Encounter (Signed)
Sent patient mychart message in regards to test results and asked if she had any questions.

## 2023-01-30 NOTE — Telephone Encounter (Signed)
-----   Message from Gean Birchwood sent at 01/29/2023 12:23 PM PST -----  Please let pt know that her right breast MRI was normal with no masses or lesions seen. Her left breast MRI showed the same 5mm mass that was seen on [place]. Radiology read the MRI as benign appearing mass and gave same recommendations as her previous imaging for repeat US in 6 months, which Dr. Reece Agar has ordered. Her referral to Dr. Pamala Hurry is pending. Thanks! dana

## 2023-01-31 ENCOUNTER — Ambulatory Visit
Admit: 2023-01-31 | Discharge: 2023-01-31 | Payer: PRIVATE HEALTH INSURANCE | Attending: Student in an Organized Health Care Education/Training Program | Primary: MD

## 2023-01-31 VITALS — BP 108/70 | HR 81 | Temp 98.20000°F | Ht 65.0 in | Wt 173.8 lb

## 2023-01-31 DIAGNOSIS — F909 Attention-deficit hyperactivity disorder, unspecified type: Secondary | ICD-10-CM

## 2023-01-31 MED ORDER — methylphenidate HCl (RITALIN) 5 mg tablet
5 | ORAL_TABLET | ORAL | 0 refills | Status: DC
Start: 2023-01-31 — End: 2023-02-06

## 2023-01-31 NOTE — Progress Notes (Unsigned)
Rachel [name] is a 37 y.o. female who presents today for Family history of attention deficit hyperactivity disorder. Eval for ADHD. Referred by Veleta Miners      /   /   /      Screening:   Depression: PHQ-9 Total: (!) 11 (01/09/2023  3:58 PM)    }   Anxiety: GAD7 Total: 5 (01/09/2023  3:58 PM)    Cognitive Assessment: {*Patient has no cognitive assessment in past year, but also no diagnosis of dementia, so this may be appropriate*:1}  PTSD:     ACE Questionnaire  Did you feel that you didn't have enough to eat, had to wear dirty clothes, or had no one to protect or take care of you? : No  Did you lose a parent through divorce, abandonment, death, or other reason?: No  Did you live with anyone who was depressed, mentally ill, or attempted suicide?: Yes  Did you live with anyone who had a problem with drinking or using drugs, including prescription drugs? : Yes  Did your parents or adults in your home ever hit, punch, beat, or threaten to harm each other?: No  Did you live with anyone who went to jail or prison? : No  Did a parent or adult in your home ever swear at you, insult you, or put you down?: No  Did a parent or adult in your home ever hit, beat, kick, or physically hurt you in any way?: No  Did you feel that no one in your family loved you or thought you were special? : No  Did you experience unwanted sexual contact (such as fondling or oral/anal/vaginal intercourse/penetration)?: No  ACE Score (total number of checked responses): 2  Do you believe that these experiences have affected your health? : Some       01/09/2023   Adult ADHD Self-Report Scale (ASRS-V1.1) Symptom Checklist    How often do you have trouble wrapping up the final details of a project, once the challenging parts have been done? 2    How often do you have difficulty getting things in order when you have to do a task that requries organization? 2    How often do you have problems remembering appointments or obligations? 4    When  you have a task that requires a lot of thought, how often do you avoid or delay getting started? 4    How often do you fidget or squirm with your hands or feet when you have to sit down for a long time? 4    How often do you feel overly active and compelled to do things, like you were driven by a motor? 1    Part A Total 17    How often do you have trouble wrapping up the final details of a project, once the challenging parts have been done? Sometimes    How often do you have difficulty getting things in order when you have to do a task that requries organization? Sometimes    How often do you have problems remembering appointments or obligations? Very Often    When you have a task that requires a lot of thought, how often do you avoid or delay getting started? Very Often    How often do you fidget or squirm with your hands or feet when you have to sit down for a long time? Very Often    How often do you feel overly active and compelled to do things,  like you were driven by a motor? Rarely    How often do you make careless mistakes when you have to work on a boring or difficult project? 2    How often do you have difficulty keeping your attention when you are doing boring or repetitive work? 4    How often do you have difficulty concentrating on what people say to you, even when they are speaking to you directly? 4    How often do you misplace or have difficulty finding things at home or at work? 3    How often are you distracted by activity or noise around you? 4    How often do you leave your seat in meetings or other situations in which you are expected to remain seated? 2    How often do you feel restless or fidgety? 3    How often do you have difficulty unwinding and relaxing when you have time to yourself? 2    How often do you find yourself talking too much when you are in social situations? 3    When you're in a conversation, how often do you find yourself finishing the sentences of the people you are  talking to, before they can finish them themselves? 4    How often do you have difficulty waiting your turn in situations when turn taking is required? 4    How often do you interrupt others when they are busy? 3    Part B Total 38    How often do you make careless mistakes when you have to work on a boring or difficult project? Sometimes    How often do you have difficulty keeping your attention when you are doing boring or repetitive work? Very Often    How often do you have difficulty concentrating on what people say to you, even when they are speaking to you directly? Very Often    How often do you misplace or have difficulty finding things at home or at work? Often    How often are you distracted by activity or noise around you? Very Often    How often do you leave your seat in meetings or other situations in which you are expected to remain seated? Sometimes    How often do you feel restless or fidgety? Often    How often do you have difficulty unwinding and relaxing when you have time to yourself? Sometimes    How often do you find yourself talking too much when you are in social situations? Often    When you're in a conversation, how often do you find yourself finishing the sentences of the people you are talking to, before they can finish them themselves? Very Often    How often do you have difficulty waiting your turn in situations when turn taking is required? Very Often    How often do you interrupt others when they are busy? 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 : No (01/09/2023  3:00 PM)  What medications did you try previously and what were the outcomes?: Wellbutrin:  75 mg twice  daily-recently changed to 100 mg: Yes/some (Zoloft, Paxil, Lexapro, Modafinil, Ativan, BuSpar) (01/09/2023  3:00 PM)    SUICIDE HISTORY  Have you ever tried to kill yourself? : no (01/09/2023  3:00 PM)    SELF HARM   Have you ever tried to harm yourself?: No (01/09/2023  3:00 PM)    VIOLENT HISTORY ASSESSMENT  Have you had any history of violent behavior?: No (01/09/2023  3:00 PM)           Family History of Suicide: No  If yes, please list who (paternal/material) and via what means: N/A      Please list type of issue is applicable: N/A    Social History:   Primary language: English  Place of birth: [place]  Where were you raised: [place], [place] and between [place] and [place]  Marital status: Married  Who lives in the home with you?:  Me, husband, 4 kids  What is your current living situation: Own house  What is your sexual orientation: No answer  Children: Yes: 1 son and 3 daughters  Employer: [employer]  Occupation: RN  Have you altered your job as a result of the problem that brought you here today: Yes  If yes explain: Really have to focus  How long: No answer  Any religious beliefs that would affect your medical care: No  Military experience: No  Branch:   Current status:   Highest level of education: Associates degree  IEP: No  504 plan: No  Quality of current relationships (family & friends): Good      Do you currently have any legal issues: No    Trauma History: Yes: Emotional.  Everyone does a little    Describe your childhood in 1 sentence:    Stressors:   Family: Mild distress  Friends: None  Relationships: None  Educational: None   Economic: Moderate stress  Occupational: Moderate stress  Housing: Mild stress   Legal: None  Health: Moderate distress      SUBSTANCE ABUSE HISTORY:   Social History     Tobacco Use   Smoking Status Former    Packs/day: 0.50    Years: 8.00    Additional pack years: 0.00    Total pack years: 4.00    Types: Cigarettes    Quit date: 03/14/2011    Years since quitting:  11.8   Smokeless Tobacco Never      Social History     Substance and Sexual Activity   Alcohol Use No     Social History     Substance and Sexual Activity   Drug Use No     Caffeine: Yes: Coffee, tea, soda                         Ambulatory Nursing Form     This dictation was produced using voice-recognition software.  Despite concurrent proofreading, please note that transcription errors are common and may not reflect the true intent of the examiner.

## 2023-01-31 NOTE — Progress Notes (Unsigned)
Rachel [name] is a 37 y.o. female who presents today for     BP: 108/70 / Pulse: 81 / SpO2: 99 % / Temp: 36.8 ?C (98.2 ?F)    Screening:   Depression: PHQ-9 Total: (!) 11 (01/31/2023 10:18 AM)    }   Anxiety: GAD7 Total: (!) 14 (01/31/2023 10:18 AM)    Cognitive Assessment: {*Patient has no cognitive assessment in past year, but also no diagnosis of dementia, so this may be appropriate*:1}  PTSD:     ACE Questionnaire  Did you feel that you didn't have enough to eat, had to wear dirty clothes, or had no one to protect or take care of you? : No  Did you lose a parent through divorce, abandonment, death, or other reason?: No  Did you live with anyone who was depressed, mentally ill, or attempted suicide?: Yes  Did you live with anyone who had a problem with drinking or using drugs, including prescription drugs? : Yes  Did your parents or adults in your home ever hit, punch, beat, or threaten to harm each other?: No  Did you live with anyone who went to jail or prison? : No  Did a parent or adult in your home ever swear at you, insult you, or put you down?: No  Did a parent or adult in your home ever hit, beat, kick, or physically hurt you in any way?: No  Did you feel that no one in your family loved you or thought you were special? : No  Did you experience unwanted sexual contact (such as fondling or oral/anal/vaginal intercourse/penetration)?: No  ACE Score (total number of checked responses): 2  Do you believe that these experiences have affected your health? : Some          Screening  Carelessness: Mild (Really can't at work, I would hurt someone. Just hard to focus sometimes.)  Difficulty sustaining attention: Severe  Doesn't listen: Severe  No follow through: Severe  Can't organize: Moderate  Avoids/dislikes tasks requiring sustained mental effort: Severe  Loses important items: Severe  Easily distractible: Severe  Forgetful in daily activities: Severe  Squirms and fidgets: Severe  Can't stay seated:  Moderate  Runs/climbs excessively: Moderate  Can't work/play quietly: Mild  On the go, driven by a motor: Mild  Talks Excessively: Severe  Blurts out answers: Severe  Can't wait for turn: Severe  Intrudes/interrupts others: Severe  Adult ADHD RS-IV Total Score: 45  Interpretation: ADHD likely. Needs full assessment.                      Ambulatory Nursing Form     This dictation was produced using voice-recognition software.  Despite concurrent proofreading, please note that transcription errors are common and may not reflect the true intent of the examiner.

## 2023-01-31 NOTE — Progress Notes (Unsigned)
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 diagnosis, treatment recommendations and options for treatment   -Strongly recommend therapy with a focus on CBT  -Recommend l-methylfolate - Up to 15 mg/day.  Educated about possible side effects of overstimulation including irritability, agitation and insomnia.  Try for at least 12 week.  Consider De[place]in at [facility].   Vitamin D; D3 preferred. Values lower than 16.  Recommend supplement between 800 to 1000 international units daily.      Anxiety symptoms   -Psychoeducation provided on diagnosis and how to cope with new diagnosis, treatment recommendations and options for treatment   -Strongly recommend therapy with a focus on CBT    Trauma symptoms  -Psychoeducation provided on diagnosis and how to cope with new diagnosis, treatment recommendations and options for treatment   -Strongly recommend therapy with a focus on TF CBT or EMDR, also consider cognitive processing therapy as well as prolonged exposure.  -Nightmares : Consider prazosin, clonidine or guanfacine, cyproheptadine, SGA's  -Intrusive thoughts :  Consider SSRIs or SGA's  -Aggression/significant irritability/mood instability ; consider mood stabilizers or SGA's  -Hypervigilance and exaggerated startle : Consider alpha agonists or b blocker. Asthma or diabetic.  -Sleep :  -Concentration :  -Anxiety :    ADHD symptoms  -Psychoeducation provided on diagnosis and how to cope with new  diagnosis, treatment recommendations and options for treatment   -Recommend Multimodal treatment plan  -Monitor vitals, weight and sleep    -Will send vanderbilt to school for collateral.   -Recommend parent management therapy   -Begin ritalin  - Fish oil : Recommend EPA content be greater than DHA by at least 1.5 times.  Consider about 1000 mg/day.  Can split the dose in half.  Some recommendations include omega bright or OroEPA.  - Nordic nature: ProOmega 1460 EPA/1010 DHA  Weight loss on stimulants. Advised on high calorie foods including avocado, low fat cheese and yogurt, 16 ounce whole milk daily as scheduled.  Advised about 3 months daily and 2-3 snacks daily.    Substance Use   -Psychoeducation provided on diagnosis and how to cope with new diagnosis, treatment recommendations and options for treatment     *[place] controlled substance database was checked and found to be satisfactory*    Medical:  -The following diagnostic orders placed: ***  -Patient instructed on how to obtain and to complete as soon as possible.  -Patient educated on the importance of ongoing metabolic monitoring including regular laboratory draws (lipid panel, BMP, A1c, Prolactin) and the importance of continuing to monitor BP, waist circumference and weight. Next labs due:    AIMS:     Lithium labs; TSH, CBC, BMP  Other labs;  Imaging;      Psychotherapy :  If greater than 15 min. 04540-98 minutes, 11914-78 minutes, 90838-60 minutes  Due to significant symptomatology of symptom exacerbation  Provided CBT techniques for anxiety, depression.  Focus was on psychoeducation, identifying cognitive distortions, behavioral techniques as well as self care.  For ADHD; rovided education on on parent management training including psychoeducation      PARQ: ***    PARQ; medications noted today were discussed in detail extensively.Clinical decision was made based on patent' s willingness to progress in this direction.  Patient was presented with  multiple options and based on informed decision would decide on which direction he wishes to go.  Patient understood and acknowledged all risks in terms of medication side effects and possible adverse events     Risks discussed, consent obtained.    Face to Face Time:   Chart Review:   Chart EHR:    Total Time:      Spent a total of *** minutes on this patient's care today.  This time includes face-to-face time with the patient, communicating findings and documenting the patient's visit in the EHR.  Spent time also discussing new diagnosis and current treatment recommendations.  Also discussed medication along  with risk and SE as well as obtaining consent.       None face-to-face time; reviewed referral, reviewing behavioral questionnaires, educated patient, referral to other healthcare professionals.     going over lab results with patient  communicating with other disciplines and PCP   care coordination  obtaining and reviewing separately obtained history  obtaining collateral information         Tyson Alias, NP        This dictation was produced using voice recognition software. Although effort was made to minimize transcription errors, homonyms and other  transcription errors that may be present and may not truly reflect my intent.

## 2023-01-31 NOTE — Patient Instructions (Signed)
Please take medication as prescribed. Call clinic if experiencing side effects.  Please call clinic or 911 or the ER if pt experiencing any self-harming thoughts, SI or HI, or with general concern or question.    Therapy Recommend individual therapy with a focus on:    - Cognitive behavioral therapy (CBT)- helps people identify negative and unhelpful thoughts and behaviors and how they can change these into ones that are more helpful and sustainable.    - Trauma-informed cognitive behavioral therapy (TF-CBT)- helps to process trauma, manage PTSD, and regain control with practical coping skills.    - Supportive counseling- aims to build your self-esteem and confidence via active listening, emotional support, and validation.    - Grief counseling - helps people cope with the emotional, physical, social, and spiritual responses to loss    - Interpersonal and social rhythm therapy (IPSRT)- focuses on improving relationships and daily routines, in order to improve mood, stability, and overall mental health.    - Recommend therapy with a focus on parent managment training     Sleep and lifestyle  Please practice sleep hygiene measures including having a bedtime routine with a set bedtime and wake up time, no caffeine at least 6 hours before bed, avoid screen time at least 90 minutes before going to bed, avoid napping during the day, keep bedroom cool, dark and quiet, avoid eating heavy meals within 3 hours of bedtime and getting physical exercise every day at least 4-5 hours before bed.  Encourage you to eat a healthy diet with many vegetables and fruits.    Substance   Advise you to cut back on caffeine use  due to its effects on mental health, especially sleep and anxiety    Emergency numbers I can call:   911   Emergency Department: [phone]  [facility]: [phone] Monday through Thursday 8 AM- 5 PM.  988- Call, text or chat online  [facility] Crisis Line: 279-532-8693  National suicide  prevention hotline; (551)550-2191.    Additional Resources:  NAMI or Resource helpline; (320)324-6623.   Staff and volunteers with lived experience assist individuals and families in navigating resources. Weekdays 9 AM to 5 PM.   Behavior Health Support line; 9134367870  Provides immediate emotional support, schedules free short-term counseling services.  24/7.  [facility] Warmline ; 800 (615)747-3933.  Staffed by trained peer support specialists who have direct lived experience.  Available 24/7.    Senior loneliness line; 502-100-4957.  Emotional support line specifically for adults 55 years and older. Available 24/7   [facility];  oregonyouthline.org  A free teen to teen crisis support and helpline.  Available 4 - 10 PM daily.  Off-hours answered by lines for Life call-center  Reach out [place]; [phone].  Parent to parent warm line 12- 7 PM weekdays [no holidays].

## 2023-02-01 NOTE — Telephone Encounter (Signed)
 Medication Prior Authorization Required:              Medication: Modafinil 100MG  tablets  Completed Via:( CoverMyMeds, Phone or Fax): CMM    Prior authorization has been submitted. Patient will receive a determination letter from insurance once decision h

## 2023-02-02 NOTE — Telephone Encounter (Signed)
Jessica notified.

## 2023-02-06 MED ORDER — methylphenidate HCl (RITALIN) 10 mg tablet
10 | ORAL_TABLET | ORAL | 0 refills | Status: DC
Start: 2023-02-06 — End: 2023-02-21

## 2023-02-06 NOTE — Telephone Encounter (Signed)
Patient called to give an update on methylphenidate HCl (RITALIN) 5 mg tablet. She said that its going ok but has noticed that it does not last long and only works for about a couple of hours and then wares off.     Please call patient to discuss     Future Appointments   Date Time Provider Department Center   02/15/2023  9:15 AM Joslyn Hy, MD [facility]   02/21/2023  8:40 AM Otho Ket, MD [facility]   02/28/2023  9:00 AM Nicole Kindred, MD [facility]   03/12/2023  3:00 PM Tyson Alias, NP [facility]

## 2023-02-06 NOTE — Telephone Encounter (Signed)
Returned patients call, there was no answer. I left a message for her to call back.    Patient called wanting to talk about her medication methylphenidate HCl (RITALIN) 5 mg tablet.     Future Appointments   Date Time Provider Department Center   02/15/2023  9:15 AM Joslyn Hy, MD [facility]   02/21/2023  8:40 AM Otho Ket, MD [facility]   02/28/2023  9:00 AM Nicole Kindred, MD [facility]   03/12/2023  3:00 PM Tyson Alias, NP [facility]

## 2023-02-06 NOTE — Telephone Encounter (Signed)
Patient returned my call regarding update on medication methylphenidate HCl (RITALIN) 5 mg tablet     Patient is reporting that the 5 mg is working however she takes it at 7 am and by 10-10:30am it feels like it has worn off. She gets really tired and feels on edge.    I advised her that at the her last office visit Rachel [name] stated that if needed she could increase to 10 mg. I advised her that I would route this call to [facility] and once she has made the decision I will call her back.    Per last office visit 01/31/23   Start methylphenidate 5 mg every morning and at noon. Risk discussed and consent obtained to start this medication. Take with food. Monitor irritability. Plan to titrate as clinically indicated. Patient given 14-day trial. May increase to methylphenidate 10 mg every morning and at noon.

## 2023-02-06 NOTE — Telephone Encounter (Signed)
Called patient per verbal order from Tyson Alias, NP to advise her that she increased the methylphenidate HCl (RITALIN) 5 mg tablet to a 10 mg tablet. She will take one tab in the morning and one tab at noon, it has been sent in and it will be ready for pick up after Monday the 2nd.    She thanked me and had nothing further to add.    Future Appointments   Date Time Provider Department Center   02/15/2023  9:15 AM Joslyn Hy, MD [facility]   02/21/2023  8:40 AM Otho Ket, MD [facility]   02/28/2023  9:00 AM Nicole Kindred, MD [facility]   03/12/2023  3:00 PM Tyson Alias, NP [facility]

## 2023-02-12 NOTE — Telephone Encounter (Addendum)
Patient called with an update on methylphenidate HCl (RITALIN) 10 mg tablet. She said that she has been taking 10 mg since 11/27 and makes her feel gassy and her stomach just feels airy and bloated. She said that the 5 mg was making her feel this way also. She has not picked up the 10 mg fill from the pharmacy yet and wanted to call with this update before picking it up.     Please call patient to discuss     Future Appointments   Date Time Provider Department Center   02/15/2023  9:15 AM Joslyn Hy, MD [facility]   02/21/2023  8:40 AM Otho Ket, MD [facility]   02/28/2023  9:00 AM Nicole Kindred, MD [facility]   03/12/2023  3:00 PM Tyson Alias, NP [facility]

## 2023-02-12 NOTE — Telephone Encounter (Signed)
Returning patient call in regards to medication methylphenidate HCl (RITALIN) 10 mg tablet making her gassy. No answer, left message for her to return my call.

## 2023-02-13 NOTE — Telephone Encounter (Signed)
Called patient per verbal order from Tyson Alias, NP to verify with patient medication change.    Switching patient from methylphenidate to dextroamphetamine formulations, due to GI side effects. I advised patient that she can return her other medication to the pharmacy. We can start on the dextroamphetamine formulation as follows:  Adderall 5 mg every morning and noon for 14 days. This will be a trial for 14 days and if this helps with the bloating and gassy feeling then we can send in for another refill. Patient verbalized that she agreed with this plan.    Patient will be returning the methylphenidate to the pharmacy.    Future Appointments   Date Time Provider Department Center   02/15/2023  9:15 AM Joslyn Hy, MD [facility]   02/21/2023  8:40 AM Otho Ket, MD [facility]   02/28/2023  9:00 AM Nicole Kindred, MD [facility]   03/12/2023  3:00 PM Tyson Alias, NP [facility]

## 2023-02-13 NOTE — Telephone Encounter (Signed)
Patient called with concern that her medication methylphenidate HCl (RITALIN) 10 mg tablet  is making her feel gassy/bloated almost the same symptoms that she had with endometriosis. It is also making her nauseous and a few times she has had to take a zofran shortly after.     She reports that she did pick up her latest prescription. When she was picking up the prescription she asked the pharmacist about the gassy/bloating feeling and she stated that they told her that sometimes that can happen with the extended release.    She also reported that she takes her morning dose and then around 9:30-10 in the morning she can feel it wearing off and then takes the second dose before noon.      She had nothing further to add. I let her know that I would route this to Leonard and talk to her about it. She told me that if I call back I can leave a message because she was at work.    Future Appointments   Date Time Provider Department Center   02/15/2023  9:15 AM Joslyn Hy, MD [facility]   02/21/2023  8:40 AM Otho Ket, MD [facility]   02/28/2023  9:00 AM Nicole Kindred, MD [facility]   03/12/2023  3:00 PM Tyson Alias, NP [facility]

## 2023-02-13 NOTE — Telephone Encounter (Signed)
We can switch from methylphenidate to dextroamphetamine formulations, due to GI side effects. Patient may have to return her recent prescription to the pharmacy. We can start the dextroamphetamine formulation as follows:  Adderall 5 mg every morning and noon for 14 days.

## 2023-02-14 MED ORDER — dextroamphetamine-amphetamine (ADDERALL) 5 mg Tab tablet
5 | ORAL_TABLET | ORAL | 0 refills | Status: DC
Start: 2023-02-14 — End: 2023-02-21

## 2023-02-15 ENCOUNTER — Ambulatory Visit
Admit: 2023-02-15 | Discharge: 2023-02-15 | Payer: PRIVATE HEALTH INSURANCE | Attending: Student in an Organized Health Care Education/Training Program | Primary: MD

## 2023-02-15 VITALS — BP 122/70 | HR 78 | Resp 16 | Ht 65.0 in | Wt 175.0 lb

## 2023-02-15 DIAGNOSIS — R002 Palpitations: Secondary | ICD-10-CM

## 2023-02-15 NOTE — Progress Notes (Signed)
Patient presenting today for Cardiac Management (Patient has no cardiac complaints./)  .     Vitals:    02/15/23 0916   BP: 122/70   BP Location: Right arm   Patient Position: Sitting   Pulse: 78   Resp: 16   SpO2: 99%   Weight: 175 lb (79.4 kg)   Height: 5' 5 (1.651 m)       HOME OXYGEN:  Assistive Devices Used at Home  Home Oxygen: No    Alcohol Use: Not At Risk (12/13/2022)    Single Alcohol Screening Question     Last SASQ: Flowsheet Data: None        reports no history of drug use.     Tobacco Use: Medium Risk (02/15/2023)    Patient History     Smoking Tobacco Use: Former     Smokeless Tobacco Use: Never     Passive Exposure: Not on file       EXERCISE:  Do you currently exercise?: (!) No

## 2023-02-15 NOTE — Progress Notes (Signed)
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 Patient has been for the most part asymptomatic since her previous visit in the cardiology clinic.  She has not had any further episodes of palpitations, states overall she is feeling quite well.  No cardiopulmonary complaints this visit including chest pain, lightheadedness, dizziness, pedal edema.      LAST ECHOCARDIOGRAM:  DATE OF STUDY:  12/07/2020        INTERPRETATION:     1.  The left ventricular cavity is normal sized.  There is normal wall thickness and normal contractility.  There are no regional wall motion abnormalities.  Ejection fraction planimetried at 62%, which correlates with the visual estimate.  Diastolic   properties are normal.  2.  The left atrium is normal in size and contracts normally.  3.  The right atrium is also of normal dimensions.  Interatrial septum is intact.  4.  The right ventricle is of normal dimensions with normal systolic function.  5.  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  Is it structurally unremarkable.  6.  The mitral annulus and mitral leaflets appear normal.  The valve is competent.    7.  The tricuspid valve is structurally normal with trivial tricuspid leak.    8.  The pulmonic valve appears normal.  Pulmonary pressures are not measured.  9.  The pericardium is intact with no effusion.    10.  The aortic root and ascending aorta are normal size.  Rhythm is sinus at a rate of 80 per minute.  11.  The pulmonary artery is not seen.  12.  The inferior vena cava appears normal in size and is collapsing with inspiration, implying normal right atrial pressures.     IMPRESSION:  Structurally and functionally normal heart.      LAST HOLTER MONITOR:  Test Date:    2020-12-09    1. This 24hr Holter  Monitor was obtained 9/29-30/22 (monitored for 22 hours  51 minutes 21 seconds) for the indication of premature ventricular  contractions and palpitations.  2. The patient was noted to be in Sinus Rhythm throughout the monitoring  period with an average HR of 93 bpm normal sinus rhythm and the maximum HR of  162 bpm sinus tachycardia and minimum HR of 67 bpm normal sinus rhythm.   3. The patient had rare to occasional PVC's representing approximately 1% of  all QRS complexes or approximately 1,000 PVC's which were mainly accelerated  idioventricular rhythm.  The computer generated analysis markedly  over-estimated the number of PVC's.  The PVC's revealed mainly episodes of  accelerated idioventricular rhythm (AIVR) with 5 to 16 beats with VR 99 to  112 bpm with 10 episodes.  There were approximately 5 couplets and the  remainder of the PVC's were isolated beats.   4. The patient had no definite PAC's noted.  5. The patient had 7 symptom entries in her diary.  The symptoms and  correlating  rhythm/rate are as follows:  flutters or flutter x 3  revealed sinus tachycardia 100 bpm without any ectopics; sinus rhythm 94 bpm  with one PVC and sinus tachycardia 104 bpm with no ectopics; flutters and  dizziness revealed sinus tachycardia 131  bpm and no ectopics; chest pain  revealed sinus tachycardia 102 bpm and no ectopics; chest pain and pressure  revealed sinus tachycardia 109 bpm and no ectopics; and dizziness revealed  sinus tachycardia 143 bpm and no ectopics.  6. There were no significant pauses or AV Block.  7. There were no Significant ST segment deviations seen.     CONCLUSION:  This 24 hour Holter monitor reveals the patient to be in sinus  rhythm throughout the monitoring period with occasional PVC's mainly as  accelerated idioventricular rhythm episodes with a total of approximately  1000 PVC's and approximately 10 episodesof AIVR of 5 to 16 beats with VR 99  to 112 bpm along with 5 couplets and the  remainder of the PVC's were isolated  beats.  There were no obvious PAC's.  The patient's symptoms as noted above  did not correlate clearly with the PVC's.      Medications at Presentation:   Home Medications               modafiniL (PROVIGIL) 100 mg tablet     Take 1 tablet by mouth once daily.             Allergies/Intolerances:   Allergies   Allergen Reactions    Adhesive Hives     Paper tape    Latex Hives and Rash    Sulfa (Sulfonamide Antibiotics) Hives and Rash    Morphine Other (See Comments)     Headache          General: Negative for fever, fatigue/weakness, weight loss and chills.    Cardiovascular: Negative for chest pain at rest, chest pain with exercise, palpitations, peripheral edema, PND, orthopnea, shortness of breath, dyspnea on exertion, syncope, claudication and orthostatic symptoms.    Neuro: Negative for dizziness, frequent falls, difficulty walking and prior CVA.    Respiratory: Negative for cough, dyspnea at rest, wheezing, shortness of breath and history of sleep apnea.    Gastrointestinal: Negative for melena and hematochezia.    Blood/Lymph: Negative for easy bruising, anemia, bleeding disorder, swollen glands and chills/sweats.      Vitals:    02/15/23 0916   BP: 122/70   BP Location: Right arm   Patient Position: Sitting   Pulse: 78   Resp: 16   SpO2: 99%   Weight: 175 lb (79.4 kg)   Height: 5' 5 (1.651 m)        Physical Exam  Vitals and nursing note reviewed.   Constitutional:       General: She is not in acute distress.     Appearance: Normal appearance. She is normal weight. She is not ill-appearing or toxic-appearing.   HENT:      Head: Normocephalic and atraumatic.      Nose: Nose normal.      Mouth/Throat:      Mouth: Mucous membranes are moist.   Eyes:      Extraocular Movements: Extraocular movements intact.      Pupils: Pupils are equal, round, and reactive to light.   Neck:      Vascular: No carotid bruit.   Cardiovascular:      Rate and Rhythm: Normal rate and regular  rhythm.      Pulses: Normal pulses.      Heart sounds: Normal heart sounds. No murmur heard.     No friction rub. No gallop.   Pulmonary:      Effort: Pulmonary effort is normal. No respiratory distress.  Breath sounds: Normal breath sounds. No wheezing, rhonchi or rales.   Musculoskeletal:      Cervical back: Neck supple.      Right lower leg: No edema.      Left lower leg: No edema.   Lymphadenopathy:      Cervical: No cervical adenopathy.   Skin:     General: Skin is warm and dry.      Capillary Refill: Capillary refill takes less than 2 seconds.   Neurological:      General: No focal deficit present.      Mental Status: She is alert and oriented to person, place, and time. Mental status is at baseline.      Cranial Nerves: No cranial nerve deficit.   Psychiatric:         Mood and Affect: Mood normal.        Clinic EKG Preliminary Report: Normal Sinus Rhythm, rate 65.    Wt Readings from Last 3 Encounters:   02/15/23 175 lb (79.4 kg)   01/31/23 173 lb 12.8 oz (78.8 kg)   04/26/22 179 lb (81.2 kg)      BP Readings from Last 3 Encounters:   02/15/23 122/70   01/31/23 108/70   01/09/23 138/74      Lab Results   Component Value Date    CHOL 132 01/24/2023    CHOL 134 01/31/2022    CHOL 149 02/22/2021      Lab Results   Component Value Date    HDL 47 (L) 01/24/2023    HDL 46 (L) 01/31/2022    HDL 50 (L) 02/22/2021     Lab Results   Component Value Date    LDLCALC 74 01/24/2023    LDLCALC 79 01/31/2022    LDLCALC 87 02/22/2021      Lab Results   Component Value Date    TRIG 54.3 01/24/2023    TRIG 46.0 01/31/2022    TRIG 60.0 02/22/2021            CARDIOLOGY ASSESSMENT:  1.  Palpitations    PLAN:  1. Essentially resolved.    Overall, Rachel [name] is doing well from a cardiology standpoint.     Her palpitations or physiological due to volume shift which started during her last trimester of pregnancy and have now resolved completely.  She does not have structural or electrical cardiac pathology.    She is currently not on  any medications from a cardiac standpoint.  She does not require any testing from a cardiac standpoint.      Patient to call the clinic with any increase in symptoms, problems, questions, or concerns. Otherwise, she will follow up in the cardiology clinic on as-needed basis patient is agreeable with the plan above.       Current Medications:   Home Medications               buPROPion (WELLBUTRIN) 100 mg tablet     Take 1 tablet by mouth 2 times daily.     Patient not taking: Reported on 01/31/2023     dextroamphetamine-amphetamine (ADDERALL) 5 mg Tab tablet     Take one tab po qam and at noon for 14 days. Call clinic with update on day 10.     docusate sodium (COLACE) 100 mg capsule     Take 100 mg by mouth once daily.     methylphenidate HCl (RITALIN) 10 mg tablet     Take 1 tab qam and noon for 14  days.     Patient not taking: Reported on 02/15/2023     phentermine 37.5 mg capsule     Take 37.5 mg by mouth every morning.     Patient not taking: Reported on 02/15/2023           Joslyn Hy, MD     This dictation was produced using voice-recognition software.  Despite concurrent proofreading, please note that transcription errors are common and may not reflect the true intent of the examiner.

## 2023-02-21 ENCOUNTER — Ambulatory Visit: Admit: 2023-02-21 | Discharge: 2023-02-21 | Payer: PRIVATE HEALTH INSURANCE | Attending: Rheumatology | Primary: MD

## 2023-02-21 ENCOUNTER — Encounter: Admit: 2023-02-21 | Payer: PRIVATE HEALTH INSURANCE | Primary: MD

## 2023-02-21 VITALS — BP 108/67 | HR 78 | Temp 97.90000°F | Ht 65.0 in | Wt 173.0 lb

## 2023-02-21 DIAGNOSIS — M79641 Pain in right hand: Secondary | ICD-10-CM

## 2023-02-21 DIAGNOSIS — M79642 Pain in left hand: Secondary | ICD-10-CM

## 2023-02-21 MED ORDER — dextroamphetamine-amphetamine ER (ADDERALL XR) 10 mg 24 hr capsule
10 | ORAL_CAPSULE | ORAL | 0 refills | Status: DC
Start: 2023-02-21 — End: 2023-03-06

## 2023-02-21 MED ORDER — meloxicam (MOBIC) 15 mg tablet
15 | ORAL_TABLET | Freq: Every day | ORAL | 1 refills | Status: AC
Start: 2023-02-21 — End: 2023-08-20
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
## 2023-02-21 NOTE — Telephone Encounter (Addendum)
The Ritalin was making my belly was so bloated and hurt so bad. Since switching haven't been had the issue, it's fine. The Adderall wears off 12-12:30, I forget to take the second dose at times.     Patient reports she is doing well with the Adderall but feels like the morning dose wears off too soon. She is also struggling with remembering the 2nd dose in the afternoon.     Discussed option to switch from immediate release to extended release. After discussing medication profile and side effects, jointly decided to switch to Adderall XR 10 mg daily.     PARQ: Adderall

## 2023-02-21 NOTE — Telephone Encounter (Signed)
Returned patient call in regards to her medication dextroamphetamine-amphetamine (ADDERALL) 5 mg Tab    Patient reports that she can feel the first dose wear off around 10 am, she takes the second dose between 12pm and 1pm. She stated that by 6pm she is starting to feel the second dose wearing off, which is fine because she will be going to bed, but dose not go to bed until about 10pm. She stated that when she was on the 10 mg of Ritalin it was working great the only thing that was concerning was the nausea and the bloating.     She reports that since she has been on Adderall her stomach is feeling much better.     She had nothing further to add. I advised her that I would route this to [place] and that we can discuss it, then I would give her a call back later this afternoon. She stated that she was home today so that would be fine.    Future Appointments   Date Time Provider Department Center   02/28/2023  9:00 AM Nicole Kindred, MD [facility]   03/12/2023  3:00 PM Tyson Alias, NP [facility]   04/25/2023  8:20 AM Otho Ket, MD [facility]

## 2023-02-21 NOTE — Assessment & Plan Note (Signed)
Current Status: Clinically insignificant HLA-B27  Today's Plan: I am giving her a trial of an NSAID and I will see her back in 3 months.

## 2023-02-21 NOTE — Assessment & Plan Note (Addendum)
Current Status: Bilateral hand pain of uncertain etiology.  This is possibly an inflammatory arthritis or overuse syndrome.  The [facility] ultrasound did show some synovial hypertrophy in the right dorsal wrist but negative Doppler.  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  Today's Plan: 1.  Laboratories ordered  2.  Start meloxicam 15 mg daily  3.  Return in 3 months

## 2023-02-21 NOTE — Addendum Note (Signed)
Addended by: Tyson Alias on: 02/21/2023 01:21 PM     Modules accepted: Orders

## 2023-02-21 NOTE — Progress Notes (Signed)
CC: NEW PATIENT REFERRED: Ok Anis, MD    Rheumatoid arthritis, involving unspecified site, unspecified whether rheumatoid factor present     There were no vitals filed for this visit.    Screening:  Depression: PHQ-9: 11 Moderate Depression (01/31/2023)  Anxiety: GAD-7: 14  (01/31/2023)  Substance Use: EtOH - EtOH SBIRT Negative (12/13/2022)  Drug - Drug SBIRT Negative (12/13/2022)  Fall Screen: Fall Risk Screen: N/A  Cognitive Assessment:    Last Colon Cancer Screening: N/A  Hearing / Vision Screen: No results found.  Food insecurity: low risk (12/13/2022)  Transportation needs: none (12/13/2022)    Care Team reviewed and up-to-date.    Smoking:   Counseling given: Not Answered    Vaccinations:  Flu: None (due today)  Pneumovax: N/A  COVID: 10/29/2019 (due today)      Allergies Reviewed  Allergies reviewed with patient or care provider: Yes        Health Maintenance Due   Topic Date Due    Tetanus Vaccine (2 - Td or Tdap) 01/05/2020    COVID-19 Vaccine (2 - 2024-25 season) 11/12/2022    Influenza Vaccine (1) 12/12/2022

## 2023-02-21 NOTE — Telephone Encounter (Signed)
Patient called to give an update on her dextroamphetamine-amphetamine (ADDERALL), She advised it works well in the morning then around 6pm she will start to feel more on edge and notices it wearing off, She was wanting to discus this before requesting a refill.    dextroamphetamine-amphetamine (ADDERALL) 5 mg Tab tablet    Take one tab po qam and at noon for 14 days. Call clinic with update on day 10., Normal     Future Appointments   Date Time Provider Department Center   02/21/2023 10:05 AM [facility]   02/28/2023  9:00 AM Nicole Kindred, MD [facility]   03/12/2023  3:00 PM Tyson Alias, NP [facility]   04/25/2023  8:20 AM Otho Ket, MD [facility]

## 2023-02-28 ENCOUNTER — Ambulatory Visit: Admit: 2023-02-28 | Discharge: 2023-02-28 | Payer: PRIVATE HEALTH INSURANCE | Attending: MD | Primary: MD

## 2023-02-28 VITALS — BP 122/67 | HR 82 | Temp 97.70000°F | Resp 16 | Ht 65.0 in | Wt 168.0 lb

## 2023-02-28 DIAGNOSIS — N63 Unspecified lump in unspecified breast: Secondary | ICD-10-CM

## 2023-02-28 DIAGNOSIS — N6322 Unspecified lump in the left breast, upper inner quadrant: Secondary | ICD-10-CM
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
## 2023-02-28 NOTE — Progress Notes (Signed)
Rachel [name] is a 37 y.o. female who presents today for a breast consultation.    BP: 122/67 / Pulse: 82 / SpO2: 99 % (Room) / Temp: 36.5 ?C (97.7 ?F)        Ambulatory Nursing Form

## 2023-03-06 MED ORDER — dextroamphetamine-amphetamine ER (ADDERALL XR) 10 mg 24 hr capsule
10 | ORAL_CAPSULE | ORAL | 0 refills | Status: DC
Start: 2023-03-06 — End: 2023-03-27

## 2023-03-06 MED ORDER — buPROPion (WELLBUTRIN) 75 mg tablet
75 | ORAL_TABLET | Freq: Two times a day (BID) | ORAL | 3 refills | 90.00000 days | Status: DC
Start: 2023-03-06 — End: 2023-04-04

## 2023-03-12 ENCOUNTER — Ambulatory Visit
Admit: 2023-03-12 | Discharge: 2023-03-12 | Payer: PRIVATE HEALTH INSURANCE | Attending: Student in an Organized Health Care Education/Training Program | Primary: MD

## 2023-03-12 VITALS — BP 118/78 | HR 85 | Temp 98.20000°F | Ht 65.0 in | Wt 172.4 lb

## 2023-03-12 DIAGNOSIS — F909 Attention-deficit hyperactivity disorder, unspecified type: Secondary | ICD-10-CM

## 2023-03-12 MED ORDER — dextroamphetamine-amphetamine (AMPHETAMINE-DEXTROAMPHETAMINE) 10 mg tablet
10 | ORAL_TABLET | ORAL | 0 refills | Status: DC
Start: 2023-03-12 — End: 2023-03-27

## 2023-03-12 NOTE — Patient Instructions (Signed)
Please take medication as prescribed. Call clinic if experiencing side effects.  Please call clinic or 911 or the ER if pt experiencing any self-harming thoughts, SI or HI, or with general concern or question.    **Please be advised on the risk of combining medications with alcohol or drugs.**     Therapy Recommend individual therapy with a focus on:  - Cognitive behavioral therapy (CBT)- helps people identify negative and unhelpful thoughts and behaviors and how they can change these into ones that are more helpful and sustainable.    - Cognitive behavioral therapy insomnia (CBT-I)- The focus is on addressing the three factors that contribute to the persistence of insomnia including: conditioned arousal, identifying and eliminating habits that were developed in an effort to improve sleep but have become ineffective and reducing sleep-related worry and other sources of heightened arousal.    - Trauma-informed cognitive behavioral therapy (TF-CBT)- helps to process trauma, manage PTSD, and regain control with practical coping skills.    - Eye movement desensitization and reprocessing (EMDR) therapy-  Patient is guided through focusing on a memory while simultaneously moving their eyes in a specific pattern. The eye movements are similar to those that occur during REM sleep. The goal is to help the patient reprocess the memory in a way that reduces its emotional intensity and makes it easier to understand    - Dialectical behavior therapy (DBT)-  designed to provide skills for managing intense emotions and negotiating social relationships. It is the treatment of choice for borderline personality disorder, emotion dysregulation, and a growing array of psychiatric conditions. It consists of group instruction and individual therapy sessions, both conducted weekly for six months to a year,    - Interpersonal therapy (IPT)- a type of talk therapy that helps people understand their emotions and how they affect their  relationships.    - Supportive counseling- aims to build your self-esteem and confidence via active listening, emotional support, and validation.    - Grief counseling - helps people cope with the emotional, physical, social, and spiritual responses to loss    - Interpersonal and social rhythm therapy (IPSRT)- focuses on improving relationships and daily routines, in order to improve mood, stability, and overall mental health.    - Recommend therapy with a focus on parent managment training     Sleep and lifestyle  Please practice sleep hygiene measures including having a bedtime routine with a set bedtime and wake up time, no caffeine at least 6 hours before bed, avoid screen time at least 90 minutes before going to bed, avoid napping during the day, keep bedroom cool, dark and quiet, avoid eating heavy meals within 3 hours of bedtime and getting physical exercise every day at least 4-5 hours before bed.  Encourage you to eat a healthy diet with many vegetables and fruits.    Take Adderall IR 10 mg in the afternoon. Continue to take the Adderall XR 10 mg     Substance   Advise you to cutback on or consider abstinence smoking  Advise you to cut back on cannabis use due to  its effects on mental health, especially sleep and anxiety as well as driving.  Advise you wait for at least 8 to 12 hours after last using cannabis that contains THC before you drive, ride a bicycle, or do any other activity that requires alertness.  Recommend a substance use treatment program including having a sponsor.  Advise you to cut back on caffeine use  due  to its effects on mental health, especially sleep and anxiety    Emergency numbers I can call:   911   Emergency Department: [phone]  [facility]: [phone] Monday through Thursday 8 AM- 5 PM.  988- Call, text or chat online  [facility] Crisis Line: [phone]  National suicide prevention hotline; 4032059466.

## 2023-03-12 NOTE — Progress Notes (Signed)
Rachel [name] is a 37 y.o. female who presents today for     BP: 118/78 / Pulse: 85 / SpO2: 99 % / Temp: 36.8 ?C (98.2 ?F)    Screening:   Depression: PHQ-9 Total: 2 (03/12/2023  2:55 PM)    }   Anxiety: GAD7 Total: 0 (03/12/2023  2:55 PM)    Cognitive Assessment:   PTSD:                Screening  Carelessness: None  Difficulty sustaining attention: Mild  Doesn't listen: Mild  No follow through: Mild  Can't organize: Mild  Avoids/dislikes tasks requiring sustained mental effort: Mild  Loses important items: None  Easily distractible: Mild  Forgetful in daily activities: None  Squirms and fidgets: None  Can't stay seated: Mild  Runs/climbs excessively: None  Can't work/play quietly: None  On the go, driven by a motor: None  Talks Excessively: Mild  Blurts out answers: Mild  Can't wait for turn: None  Intrudes/interrupts others: Mild  Adult ADHD RS-IV Total Score: 10  Interpretation: ADHD unlikely. Alternative explanations for clinical presentation to be considered.                   Ambulatory Nursing Form     This dictation was produced using voice-recognition software.  Despite concurrent proofreading, please note that transcription errors are common and may not reflect the true intent of the examiner.
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
## 2023-03-14 ENCOUNTER — Other Ambulatory Visit: Admit: 2023-03-14 | Payer: PRIVATE HEALTH INSURANCE | Primary: MD

## 2023-03-14 DIAGNOSIS — M79642 Pain in left hand: Secondary | ICD-10-CM

## 2023-03-14 DIAGNOSIS — Z809 Family history of malignant neoplasm, unspecified: Secondary | ICD-10-CM

## 2023-03-14 DIAGNOSIS — M79641 Pain in right hand: Secondary | ICD-10-CM

## 2023-03-14 LAB — COMPREHENSIVE METABOLIC PANEL
ALT - Alanine Aminotransferase: 12 [IU]/L (ref 5–33)
AST - Aspartate Aminotransferase: 19 [IU]/L (ref 5–32)
Albumin/Globulin Ratio: 1.5 (ref 1.2–2.2)
Albumin: 4.2 g/dL (ref 3.5–5.0)
Alkaline Phosphatase: 74 [IU]/L (ref 35–105)
Anion Gap: 9.3 mmol/L (ref 9.0–18.0)
BUN / Creatinine Ratio: 17.7 (ref 12.0–20.0)
BUN: 11 mg/dL (ref 8–20)
Bilirubin Total: 0.37 mg/dL (ref 0.10–1.70)
CO2 - Carbon Dioxide: 24 mmol/L (ref 17–27)
Calcium: 8.4 mg/dL — ABNORMAL LOW (ref 8.6–10.6)
Chloride: 103.7 mmol/L (ref 101.0–111.0)
Creatinine: 0.62 mg/dL (ref 0.60–1.30)
Glomerular Filtration Rate Estimate (Female): >90 mL/min/{1.73_m2} (ref >=60)
Glucose: 94 mg/dL (ref 74–106)
Osmolality Calculation: 273 mosm/kg — ABNORMAL LOW (ref 275.0–300.0)
Potassium: 3.65 mmol/L (ref 3.50–5.10)
Protein Total: 7 g/dL (ref 6.4–8.2)
Sodium: 137 mmol/L (ref 135–145)

## 2023-03-14 LAB — CBC WITH AUTO DIFFERENTIAL
Basophils %: 0 % (ref 0–2)
Basophils, Absolute: 0 10*3/ÂµL (ref 0.0–0.1)
Eosinophils %: 1 % (ref 0–5)
Eosinophils, Absolute: 0.1 10*3/ÂµL (ref 0.0–0.4)
HCT: 42.1 % (ref 35.0–48.0)
Hemoglobin: 14.4 g/dL (ref 11.7–16.5)
Lymphocytes %: 24 % (ref 20–40)
Lymphocytes, Absolute: 2.1 10*3/ÂµL (ref 1.0–4.0)
MCH: 29.7 pg (ref 28.3–33.3)
MCHC: 34.3 g/dL (ref 32.5–36.0)
MCV: 86.5 fL (ref 81.0–100.0)
MPV: 7.4 fL (ref 6.9–10.0)
Monocytes %: 6 % (ref 2–12)
Monocytes, Absolute: 0.5 10*3/ÂµL (ref 0.2–1.0)
Neutrophils %: 69 % (ref 50–74)
Neutrophils, Absolute: 6 10*3/ÂµL (ref 2.0–7.4)
Platelet Count: 168 10*3/ÂµL (ref 150–405)
RBC: 4.87 10*6/ÂµL (ref 3.80–5.60)
RDW: 12.5 % (ref 11.7–16.1)
WBC: 8.7 10*3/ÂµL (ref 4.5–11.0)

## 2023-03-14 LAB — PROTIME-INR
INR: 1 (ref 0.9–1.1)
Protime: 11.6 s (ref 10.0–13.2)

## 2023-03-14 LAB — ERYTHROCYTE SEDIMENTATION RATE, AUTOMATED: Erythrocyte Sedimentation Rate, Automated: 8 mm/h (ref 0–20)

## 2023-03-14 LAB — C-REACTIVE PROTEIN (SLM): C-Reactive Protein: <3 mg/L (ref <5.0)

## 2023-03-16 ENCOUNTER — Inpatient Hospital Stay: Admit: 2023-03-16 | Discharge: 2023-03-27 | Payer: PRIVATE HEALTH INSURANCE | Primary: MD

## 2023-03-16 VITALS — BP 108/74 | HR 75 | Temp 98.90000°F | Resp 16 | Ht 65.0 in | Wt 171.9 lb

## 2023-03-16 DIAGNOSIS — N63 Unspecified lump in unspecified breast: Secondary | ICD-10-CM

## 2023-03-16 DIAGNOSIS — D242 Benign neoplasm of left breast: Secondary | ICD-10-CM

## 2023-03-16 LAB — TISSUE EXAM

## 2023-03-16 MED ORDER — lidocaine (PF) (XYLOCAINE) 10 mg/mL (1 %) injection 2 mL
10 | Freq: Once | INTRAMUSCULAR | Status: AC
Start: 2023-03-16 — End: 2023-03-16
  Administered 2023-03-16: 17:00:00 10 mL via SUBCUTANEOUS

## 2023-03-16 MED ORDER — sodium bicarbonate 4.2 % (0.5 mEq/mL) injection 0.5 mEq
4.2 | Freq: Once | INTRAVENOUS | Status: AC
Start: 2023-03-16 — End: 2023-03-16
  Administered 2023-03-16: 17:00:00 4.2 meq via SUBCUTANEOUS

## 2023-03-16 MED ORDER — lidocaine-EPINEPHrine (XYLOCAINE W/EPI) 1 %-1:100,000 injection 6.5 mL
1 | Freq: Once | INTRAMUSCULAR | Status: AC
Start: 2023-03-16 — End: 2023-03-16
  Administered 2023-03-16: 17:00:00 1 mL via INTRADERMAL

## 2023-03-16 NOTE — Progress Notes (Signed)
 Pt transported to procedure via wheelchair.

## 2023-03-16 NOTE — Other (Signed)
Left breast ultrasound-guided biopsy reviewed.  Lesion appears adequately biopsied.  12-gauge vacuum-assisted device used.  Clip placed.  Pathology still pending.  Patient has scheduled follow-up with me on 03/28/2023.

## 2023-03-16 NOTE — Progress Notes (Signed)
 Discussed with pt about discharge instructions, medications and follow up appointments. Pt verbalized good understanding. All belongings sent home with pt. Pt discharged in stable condition.

## 2023-03-16 NOTE — Progress Notes (Signed)
 Pt arrived to unit, ambulated to room. Pt oriented to room and controls. Will review procedure and consent.

## 2023-03-16 NOTE — Other (Signed)
 SBAR report given to [facility] RN via [employer] text.

## 2023-03-16 NOTE — Progress Notes (Signed)
 Pt arrived back to unit post procedure via wheelchair.

## 2023-03-16 NOTE — Other (Signed)
Benign results noted.  This would be consistent with radiographic impression and clinical exam.  My office to contact patient and inform her that her biopsy results are benign.  I will discuss further details with her at the time of her scheduled follow-up with me.

## 2023-03-22 NOTE — Telephone Encounter (Signed)
Patient phoned reports the Adderall extended release, I feel like it's not lasting as long and not quite working as the immediate release was working. Denies side effects. Discussed option to increase to Adderall extended release 15 mg daily and may continue taking additional Adderall immediate release if needed in the afternoon. Patient agrees to call with update. May consider increasing to 20 mg of extended release. May also consider Vyvanse, have discussed this in the past with patient but due to cost, has opt to continue with current formulation.      PARQ: Adderall

## 2023-03-22 NOTE — Telephone Encounter (Signed)
Future Appointments   Date Time Provider Department Center   03/28/2023  8:30 AM Eshan Trupiano Kindred, MD [facility]   04/25/2023  8:20 AM Otho Ket, MD [facility]   05/09/2023  1:20 PM Tyson Alias, NP [facility]      Please reschedule patient's appointment. Christel Mormon will be out of the office on this day.

## 2023-03-23 MED ORDER — dextroamphetamine-amphetamine ER (ADDERALL XR) 5 mg 24 hr capsule
5 | ORAL_CAPSULE | ORAL | 0 refills | Status: DC
Start: 2023-03-23 — End: 2023-03-27

## 2023-03-27 MED ORDER — dextroamphetamine-amphetamine (AMPHETAMINE-DEXTROAMPHETAMINE) 10 mg tablet
10 | ORAL_TABLET | ORAL | 0 refills | Status: DC
Start: 2023-03-27 — End: 2023-04-04

## 2023-03-27 MED ORDER — dextroamphetamine-amphetamine ER (ADDERALL XR) 15 mg 24 hr capsule
15 | ORAL_CAPSULE | Freq: Every morning | ORAL | 0 refills | Status: DC
Start: 2023-03-27 — End: 2023-04-04

## 2023-03-27 NOTE — Telephone Encounter (Signed)
Medication: dextroamphetamine-amphetamine ER (ADDERALL XR)      Dose: 10mg     Quantity: 30 capsules  Refills:0  Directions (How Patient is Currently Taking): Take 1 tab po every morning, Normal   Last Physician to Fill: Tyson Alias   Last Date Filled by Pharmacy on bottle: 03/06/23  Pharmacy: [facility]   Notes:     Future Appointments   Date Time Provider Department Center   03/28/2023  8:30 AM Nicole Kindred, MD [facility]   04/25/2023  8:20 AM Otho Ket, MD [facility]   05/17/2023  2:20 PM Tyson Alias, NP [facility]       Last Completed Office Visit 03/12/23

## 2023-03-27 NOTE — Telephone Encounter (Signed)
Left VM to reschedule

## 2023-03-27 NOTE — Telephone Encounter (Signed)
Called patient per verbal request from Tyson Alias, NP to get update on the increase of dosage for her dextroamphetamine-amphetamine ER (ADDERALL XR) 10 mg 24 hr capsule Rachel [name] recently increased to 15 mg. Patient is reporting that the increase in dosage is working good.     She reported that is not the medication that she is needing refilled. She is needing dextroamphetamine-amphetamine (AMPHETAMINE-DEXTROAMPHETAMINE) 10 mg tablet refilled. I advised her that I would get that fixed in the system. Patient did state that in a couple of weeks she would need the extended release refilled and asked when it is time for those to be refilled would she need to call in for those? I advised her that I can speak to [facility] and clarify with her if I can post date it for 2 weeks. She thanked me and stated that she was at work and if I could not post date then to just call her and leave her a message.     Nothing further was added, call ended.    Medication: dextroamphetamine-amphetamine (AMPHETAMINE-DEXTROAMPHETAMINE)       Dose: 10 mg tablet     Quantity: 30  Refills:0  Directions (How Patient is Currently Taking):  Take 1 tab po in the afternoon. Continue with Adderall XR 10 mg qam   Last Physician to Fill: Tyson Alias, NP   Last Date Filled by Pharmacy on bottle: 03/12/23  Pharmacy: [facility] - [place], [place] - [id number]   Notes:     Future Appointments   Date Time Provider Department Center   03/28/2023  8:30 AM Nicole Kindred, MD [facility]   04/25/2023  8:20 AM Otho Ket, MD [facility]   05/17/2023  2:20 PM Tyson Alias, NP [facility]       Last Completed Office Visit 03/12/23   -Start Adderall immediate release 10 mg for afternoon dosing.

## 2023-03-27 NOTE — Telephone Encounter (Signed)
Future Appointments   Date Time Provider Department Center   03/28/2023  8:30 AM Nicole Kindred, MD [facility]   04/25/2023  8:20 AM Otho Ket, MD [facility]   05/17/2023  2:20 PM Tyson Alias, NP [facility]       Per last office visit on 03/12/23   Continue Adderall extended release 10 mg every morning. May consider increasing this dose if no benefit from adding immediate release in the afternoon.

## 2023-03-27 NOTE — Telephone Encounter (Signed)
Called patient and left her a detailed message per her request that her Adderall immediate release has been ordered and her 15 mg extended release has been post dated to be picked up after 04/06/23. I added if she had anything further that she could call us.

## 2023-03-28 ENCOUNTER — Ambulatory Visit: Admit: 2023-03-28 | Discharge: 2023-03-28 | Payer: PRIVATE HEALTH INSURANCE | Attending: MD | Primary: MD

## 2023-03-28 VITALS — HR 86 | Temp 99.90000°F | Resp 16

## 2023-03-28 DIAGNOSIS — R928 Other abnormal and inconclusive findings on diagnostic imaging of breast: Secondary | ICD-10-CM
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
## 2023-03-28 NOTE — Progress Notes (Signed)
Rachel [name] is a 38 y.o. female who presents today for post breast mass bx.      / Pulse: 86 / SpO2: 99 % (Room) / Temp: 37.7 ?C (99.9 ?F)        Ambulatory Nursing Form

## 2023-03-29 NOTE — Telephone Encounter (Signed)
Rescheduled.     Future Appointments   Date Time Provider Department Center   04/25/2023  8:20 AM Otho Ket, MD [facility]   05/17/2023  2:20 PM Tyson Alias, NP [facility]

## 2023-04-02 NOTE — Telephone Encounter (Signed)
Patient called wondering if Christel Mormon would be open to the Patient trying Strattera instead of dextroamphetamine-amphetamine ER (ADDERALL XR) she advised it is giving her constipation.    dextroamphetamine-amphetamine ER (ADDERALL XR) 15 mg 24 hr capsule    Take 1 capsule by mouth every morning., Starting Fri 04/06/2023, Until Sun 05/06/2023, Normal     Future Appointments   Date Time Provider Department Center   04/25/2023  8:20 AM Otho Ket, MD [facility]   05/17/2023  2:20 PM Tyson Alias, NP [facility]

## 2023-04-03 NOTE — Telephone Encounter (Signed)
Left voice mail for patient to return call. Questions to ask when patient calls back:  When did she start experiencing constipation?  Was she having the constipation with immediate release?  What other side effects is she experiencing?

## 2023-04-04 NOTE — Telephone Encounter (Signed)
When I was on the Wellbutrin it made me a little constipated but when I started taking the Adderall it made it a bit worse. But I really noticed the constipation increase when we went from the Adderall extended release 10 mg to 15 mg. Reports benefits from Adderall with focus but would like to try a non stimulant for possible less side effects.     Reviewed in detail side effects of Atomoxetine, specifically causing dry mouth, constipation, nausea, and vomiting. Discussed option to titrate slow on this medication to avoid intolerable side effects. Advised patient to monitor side effects. Patient expressed understanding and gave consent to start this medication.       Plan:  Atomoxetine 18 mg x 14 days then increase to 36 mg (2 capsules) from then on. We will hold this dose, which is subtherapeutic (patient aware) and reassess in 1 month.     All questions and concerns addressed at this visit.     PARQ: atomoxetine, adderall     03/28/22 CMP Reviewed on 04/04/23 within normal limits. ALT and AST : WNL   Will order new labs at next visit. 05/17/2023

## 2023-04-04 NOTE — Telephone Encounter (Signed)
Patient called in and answered Questions to ask when patient calls back: Patient also reported stopping the medication four days ago due to the constipation.     When did she start experiencing constipation? Patient reported experiencing constipation this whole time, just really experienced it when dose was increased to 15mg . Patient reported drinking prune juice to try and help.  Was she having the constipation with immediate release? Patient stated they experienced constipation with the immediate release, but it has not been as bad as the extended release. Patient stated the extended made the constipation worse.  What other side effects is she experiencing? Patient stated there have been no other side effects, just believes if there is a medication that will not constipate them, they would prefer to do that instead of managing the constipation as is.     Patient reported researching Strattera stating constipation was the least likely side effect.    Please contact patient at earliest convenience.

## 2023-04-05 MED ORDER — atomoxetine (STRATTERA) 18 mg capsule
18 | ORAL_CAPSULE | ORAL | 1 refills | 30.00000 days | Status: DC
Start: 2023-04-05 — End: 2023-05-22

## 2023-04-12 NOTE — Telephone Encounter (Signed)
Patient called and advised that the Strattera is not going well. She is bloating and having belly cramps. She only took it once and that happened. She is not taking it any longer.    She said she spoke with the pharmacy and they mentioned Guanfacine to bring up to the provider. She would like a call back to discuss, she is aware the MA and provider are out until next week.    atomoxetine (STRATTERA) 18 mg capsule    Take 1 capsule po daily for 14 days then increase 2 capsules po daily from then on., Normal       Future Appointments   Date Time Provider Department Center   04/25/2023  8:20 AM Otho Ket, MD [facility]   05/17/2023  2:20 PM Tyson Alias, NP [facility]

## 2023-04-12 NOTE — Telephone Encounter (Signed)
Phone call was placed to patient to let her know that Christel Mormon is out until Tuesday and she will address than when she is back. Patient verbalized understanding.  NM

## 2023-04-25 ENCOUNTER — Encounter: Payer: PRIVATE HEALTH INSURANCE | Attending: Rheumatology | Primary: MD

## 2023-05-09 ENCOUNTER — Encounter: Attending: Student in an Organized Health Care Education/Training Program | Primary: MD

## 2023-05-14 NOTE — Telephone Encounter (Signed)
 Marchelle Folks from outpatient scheduling calls in stating she has a question for Dr. Georgann Housekeeper MA about the patient's ultrasound that was ordered. Please give Marchelle Folks a call back at ext [phone] when available.    Thank you,  MM

## 2023-05-15 NOTE — Telephone Encounter (Signed)
 Patient called to schedule follow up breast US. Marchelle Folks sent message to Dr Sharia Reeve and Marchelle Folks got a message  from Dr Pamala Hurry that she needs 6 month post biopsy [place] is due 09/13/23.     Order from PCP 12/18/22 is still active. Is it ok to cancel this order? Marchelle Folks states that Dr Pamala Hurry placed order for [place] in July.     Karn Cassis, LPN

## 2023-05-17 ENCOUNTER — Ambulatory Visit
Admit: 2023-05-17 | Discharge: 2023-05-17 | Payer: PRIVATE HEALTH INSURANCE | Attending: Student in an Organized Health Care Education/Training Program | Primary: MD

## 2023-05-17 DIAGNOSIS — F909 Attention-deficit hyperactivity disorder, unspecified type: Secondary | ICD-10-CM

## 2023-05-17 MED ORDER — lisdexamfetamine (VYVANSE) 40 mg capsule
40 | ORAL_CAPSULE | ORAL | 0 refills | Status: DC
Start: 2023-05-17 — End: 2023-05-17

## 2023-05-17 MED ORDER — lisdexamfetamine (VYVANSE) 30 mg capsule
30 | ORAL_CAPSULE | ORAL | 0 refills | 30.00000 days | Status: DC
Start: 2023-05-17 — End: 2023-05-22

## 2023-05-17 NOTE — Patient Instructions (Addendum)
 Please take medication as prescribed. Call clinic if experiencing side effects.  Please call clinic or 911 or the ER if pt experiencing any self-harming thoughts, SI or HI, or with general concern or question.    Medications:    Start Vyvanse 30 mg daily for 14 days. Call clinic with update next week (05/23/23) with an update.       Sleep and lifestyle  Please practice sleep hygiene measures including having a bedtime routine with a set bedtime and wake up time, no caffeine at least 6 hours before bed, avoid screen time at least 90 minutes before going to bed, avoid napping during the day, keep bedroom cool, dark and quiet, avoid eating heavy meals within 3 hours of bedtime and getting physical exercise every day at least 4-5 hours before bed.  Encourage you to eat a healthy diet with many vegetables and fruits.    Substance   Advise you to cut back on caffeine use  due to its effects on mental health, especially sleep and anxiety    Emergency numbers I can call:   911   Emergency Department: [phone]  [facility]: [phone] Monday through Thursday 8 AM- 5 PM.  988- Call, text or chat online  [facility] Crisis Line: [phone]  National suicide prevention hotline; 917-658-3760.
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
## 2023-05-17 NOTE — Telephone Encounter (Signed)
 Order has been cancelled.

## 2023-05-17 NOTE — Progress Notes (Signed)
 Olita Takeshita is a 38 y.o. female who presents today for     BP: 118/70 / Pulse: 84 / SpO2: 99 % / Temp: 36.9 ?C (98.5 ?F)    Screening:   Depression: PHQ-9 Total: 8 (05/17/2023  2:01 PM)    }   Anxiety: GAD7 Total: 3 (05/17/2023  2:01 PM)    Cognitive Assessment:   PTSD:                Screening  Carelessness: Moderate  Difficulty sustaining attention: Severe  Doesn't listen: Mild  No follow through: Mild  Can't organize: Moderate  Avoids/dislikes tasks requiring sustained mental effort: Severe  Loses important items: Moderate  Easily distractible: Severe  Forgetful in daily activities: Severe  Squirms and fidgets: Moderate  Can't stay seated: Mild  Runs/climbs excessively: Mild  Can't work/play quietly: Mild  On the go, driven by a motor: None  Talks Excessively: Mild  Blurts out answers: Moderate  Can't wait for turn: Moderate  Intrudes/interrupts others: Moderate  Adult ADHD RS-IV Total Score: 32  Interpretation: May require full assessment. Use clinical judgement based on all available evidence.                   Ambulatory Nursing Form     This dictation was produced using voice-recognition software.  Despite concurrent proofreading, please note that transcription errors are common and may not reflect the true intent of the examiner.

## 2023-05-17 NOTE — Addendum Note (Signed)
 Addended by: Karn Cassis on: 05/17/2023 01:29 PM     Modules accepted: Orders

## 2023-05-22 MED ORDER — lisdexamfetamine (VYVANSE) 30 mg capsule
30 | ORAL_CAPSULE | ORAL | 0 refills | 30.00000 days | Status: DC
Start: 2023-05-22 — End: 2023-06-28

## 2023-05-22 NOTE — Telephone Encounter (Signed)
 Spoke with patient, I feel like it's lasting all day, the dose is good. No stomach issues.  Denies side effects. Will hold at current Vyvanse 30 mg daily for at least 6 months before considering increase to avoid GI side effects.     Post dated order for 05/29/23 Vyvanse 30 mg / 30 tabs.    PARQ: Vyvanse.

## 2023-06-25 ENCOUNTER — Inpatient Hospital Stay: Admit: 2023-06-25 | Discharge: 2023-07-25 | Attending: MD | Primary: MD

## 2023-06-25 DIAGNOSIS — R1030 Lower abdominal pain, unspecified: Secondary | ICD-10-CM

## 2023-06-28 ENCOUNTER — Ambulatory Visit
Admit: 2023-06-28 | Discharge: 2023-06-28 | Attending: Student in an Organized Health Care Education/Training Program | Primary: MD

## 2023-06-28 DIAGNOSIS — F32A Depression, unspecified: Secondary | ICD-10-CM

## 2023-06-28 DIAGNOSIS — F909 Attention-deficit hyperactivity disorder, unspecified type: Secondary | ICD-10-CM

## 2023-06-28 MED ORDER — lisdexamfetamine (VYVANSE) 30 mg capsule
30 | ORAL_CAPSULE | ORAL | 0 refills | 30.00000 days | Status: DC
Start: 2023-06-28 — End: 2023-07-11
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
## 2023-06-28 NOTE — Progress Notes (Signed)
 Ksenia Kunz is a 38 y.o. female who presents today for a follow up appointment.      /   /   /      Screening:   Depression: PHQ-9 Total: (!) 11 (06/28/2023 10:20 AM)    }   Anxiety: GAD7 Total: 4 (06/28/2023 10:20 AM)    Cognitive Assessment:   PTSD:     PCL-5: In the past month, how much were you bothered by:  Repeated, disturbing, and unwanted memories of the stressful experience?: Not at all  Repeated, disturbing dreams of the stressful experience?: Not at all  Suddenly feeling or acting as if the stressful experience were actually happening again (as if you were actually back there reliving it)?: Not at all  Feeling very upset when something reminded you of the stressful experience?: Not at all  Having strong physical reactions when something reminded you of the stressful experience (for example, heart pounding, trouble breathing, sweating)?: Not at all  Avoiding memories, thoughts, or feelings related to the stressful experience?: Not at all  Avoiding external reminders of the stressful experience (for example, people, places, conversations, activities, objects, or situations)?: Not at all  Trouble remembering important parts of the stressful experience?: Not at all  Having strong negative beliefs about yourself, other people, or the world (for example, having thoughts such as: I am bad, there is something seriously wrong with me, no one can be trusted, the world is completely dangerous)?: Not at all  Blaming yourself or someone else for the stressful experience or what happened after it?: Not at all  Having strong negative feelings such as fear, horror, anger, guilt, or shame?: A little bit  Loss of interest in activities that you used to enjoy?: Moderately  Feeling distant or cut off from other people?: Moderately  Trouble experiencing positive feelings (for example, being unable to feel happiness or have loving feelings for people close to you)?: Not at all  Irritable behavior, angry outbursts, or  acting aggressively?: Not at all  Taking too many risks or doing things that could cause you harm?: Not at all  Being superalert or watchful or on guard?: A little bit  Feeling jumpy or easily startled?: Moderately  Having difficulty concentrating?: Quite a bit  Trouble falling or staying asleep?: Not at all  Post-Traumatic Stress Disorder Total Score: 11              Screening  Carelessness: Moderate  Difficulty sustaining attention: Severe  Doesn't listen: Severe  No follow through: Severe  Can't organize: Severe  Avoids/dislikes tasks requiring sustained mental effort: Severe  Loses important items: Severe  Easily distractible: Severe  Forgetful in daily activities: Severe  Squirms and fidgets: Severe  Can't stay seated: Mild  Runs/climbs excessively: Moderate  Can't work/play quietly: Mild  On the go, driven by a motor: Mild  Talks Excessively: Moderate  Blurts out answers: Severe  Can't wait for turn: Moderate  Intrudes/interrupts others: Moderate  Adult ADHD RS-IV Total Score: 43  Interpretation: ADHD likely. Needs full assessment.                   Ambulatory Nursing Form     This dictation was produced using voice-recognition software.  Despite concurrent proofreading, please note that transcription errors are common and may not reflect the true intent of the examiner.

## 2023-06-28 NOTE — Patient Instructions (Signed)
 Please take medication as prescribed. Call clinic if experiencing side effects.  Please call clinic or 911 or the ER if pt experiencing any self-harming thoughts, SI or HI, or with general concern or question.      Sleep and lifestyle  Please practice sleep hygiene measures including having a bedtime routine with a set bedtime and wake up time, no caffeine at least 6 hours before bed, avoid screen time at least 90 minutes before going to bed, avoid napping during the day, keep bedroom cool, dark and quiet, avoid eating heavy meals within 3 hours of bedtime and getting physical exercise every day at least 4-5 hours before bed.  Encourage you to eat a healthy diet with many vegetables and fruits.      Emergency numbers I can call:   911   Emergency Department: [phone]  [facility]: [phone] Monday through Thursday 8 AM- 5 PM.  988- Call, text or chat online  [facility] Crisis Line: [phone]  National suicide prevention hotline; 3253843413.

## 2023-07-10 NOTE — Telephone Encounter (Signed)
 Future Appointments   Date Time Provider Department Center   10/03/2023  9:40 AM Noe Roers, NP [facility]     Rachel [name] is requesting her dose to be increased, states the 30 mg is not working well.        Per Office Visit on 06/28/2023:   Vyvanse  30 mg every morning.  Will hold at current dose.

## 2023-07-10 NOTE — Telephone Encounter (Signed)
 Rachel [name] is requesting her dose to be increased, states the 30 mg is not working well.     Medication: lisdexamfetamine (VYVANSE )      Dose: 30 mg    Quantity: 30  Refills:0  Directions (How Patient is Currently Taking): Take 1 capsule po daily   Last Physician to Fill: Zenaida Gabriel, NP  Last Date Filled by Pharmacy on bottle: 06/28/2023  Pharmacy:   [facility] [place], [place] - [id number]

## 2023-07-11 MED ORDER — lisdexamfetamine (VYVANSE) 40 mg capsule
40 | ORAL_CAPSULE | ORAL | 0 refills | 30.00000 days | Status: DC
Start: 2023-07-11 — End: 2023-09-28

## 2023-07-11 NOTE — Telephone Encounter (Signed)
 Contacted Rachel [name] to ask further questions. Rachel [name] stated she can tell the medication is there, but it's not as effective as previous prescriptions. Rachel [name] stated the Vyvanse  is wearing off faster and would like a dose increase. Rachel [name] reported no GI side effects or issues, no sleep or irritability side effects either. Rachel [name] stated her CT scan came back and it showed she has a kidney stone. Rachel [name] stated she is waiting on her PCP for further information. Rachel [name] stated the medication has not caused stomach issues.    Rachel [name] also wanted to inform provider that the newest prescription of Vyvanse  that was sent to their pharmacy has not been picked up yet.

## 2023-08-21 NOTE — Telephone Encounter (Signed)
 Date of last signed Contract on file: none  Last UDS done: none    Requesting pharmacy: [facility]    Requested Prescriptions     Pending Prescriptions Disp Refills    modafiniL  (PROVIGIL ) 100 mg tablet [Pharmacy Med Name: MODAFINIL  100MG  TABS] 30 tablet 2     Sig: TAKE ONE TABLET BY MOUTH ONCE DAILY       Last Assessment & Plan w/PCP at last OV 12/13/22 w/Dr Gregary Lean:  No medication changes noted.    Medication(s) listed as active at last OV and no changes were made to dose.    RX(s) pended to pharmacy per Prescription Standing Orders.     Recent Visits  Date Type Provider Dept   01/09/23 Office Visit Loney Rivet, [place] [facility]   12/13/22 Office Visit Shepard Dicker, MD [facility]   03/28/22 Office Visit Alethea Andes, FNP [facility]   02/28/22 Office Visit Bert Britain, MD [facility]   Showing recent visits within past 540 days with a meds authorizing provider and meeting all other requirements  Future Appointments  No visits were found meeting these conditions.  Showing future appointments within next 150 days with a meds authorizing provider and meeting all other requirements

## 2023-08-29 ENCOUNTER — Ambulatory Visit: Admit: 2023-08-29 | Discharge: 2023-08-29 | Attending: MD | Primary: MD

## 2023-08-29 VITALS — BP 123/71 | HR 67 | Temp 97.90000°F | Ht 65.0 in | Wt 183.6 lb

## 2023-08-29 DIAGNOSIS — R194 Change in bowel habit: Secondary | ICD-10-CM

## 2023-08-29 MED ORDER — sodium-potassium-magnesium sulfates (SUPREP BOWEL PREP KIT) solution
17.5-3.13-1.6 | Freq: Two times a day (BID) | ORAL | 0 refills | Status: AC
Start: 2023-08-29 — End: 2023-08-30

## 2023-08-29 NOTE — Patient Instructions (Signed)
 We will send the PREP prescription to your pharmacy electronically. Pick up your colon prep from your pharmacy as you would any other prescription,within the next 7-10 days.Do NOT begin taking the prep until the day prior to the procedure as instructed below.       Expect a phone call from a Pre Op nurse to discuss any medications that you need to stop before your procedure. IF YOU ARE ON A GLP-1 MEDICATION, YOU WILL HAVE TO HOLD IT FOR AT LEAST 7 DAYS PRIOR TO YOUR PROCEDURE.      SPLIT PREP for Colonoscopy: Please read these instructions at least a week before your procedure!   FAILURE TO FOLLOW INSTRUCTIONS MAY RESULT IN DELAY OR CANCELLATION OF YOUR PROCEDURE.    10/01/23  THE DAY BEFORE YOUR COLONOSCOPY  CLEAR LIQUIDS ALL DAY--NO SOLID FOODS, NO DAIRY, NO BREAKFAST,NO LUNCH, NO DINNER AND NO SNACKS. NOTHING TO EAT!!  CLEAR LIQUID DIET   Ginger ale, Soft Drinks (all Pepsi & Coke products are ok)  Gatorade, Crystal Light, Kool-Aid, Propel  Popsicles or Ice Pops  Water , Coconut Water   Apple juice, White Grape juice  Chicken or Beef bouillon/broth  Jell-O NO RED  Coffee (no creamer, dairy or non-dairy) Sugar OK  Tea with lemon  Ensure CLEAR nutrition drink    5:00 PM you must drink at least 32 oz of any clear liquids over the next hour (5:00 - 6:00 pm).  6:00 PM dilute and drink the first bottle of prep as shown in Instructions for Use, Split Dose (2-Day) Regimen and graphic enclosed in the box. Follow with at least (2) 16 oz cups of clear liquids over the next hour as instructed.  You are encouraged to continue drinking approved clear liquids (see list above) until 1 hour after your second dose of prep.     No chewing tobacco after midnight.    10/02/23   THE DAY OF YOUR COLONOSCOPY  8:30 AM, dilute and drink the second bottle of prep as shown for in Instructions for Use, Split Dose (2-Day) Regimen and graphic enclosed in the box. Follow with at least (2) 16oz cups of clear liquids as instructed over  the next hour.  9:30 AM, NO MORE BY MOUTH UNTIL AFTER YOUR PROCEDURE.  Morning medications, with just a sip of water - FOLLOW INSTRUCTIONS FROM THE SURGERY CENTER REGARDING WHICH MEDICATIONS YOUR ANESTHESIOLOGIST PREFERS YOU TO TAKE. Generally these include only heart or blood pressure medications.  Check in time is approximately 12:00 AM at [facility], [address] [phone]  You must have someone drive you home. You will be sedated for your procedure, you should not drive again until the morning after your procedure.               A note about Insurance coverage for your bowel prep  Dr. Christiana Cower may prescribe a split (2-dose) prep for your upcoming colon procedure.   SuPrep (or it's generic) is his split prep of choice since it produces a good clean colon while also being easier on you, the patient. It is one of the newer preps on the market and was developed to improve upon the less effective, more difficult bowel preps of years past. The experience in our office is that SuPrep (or generic) is well tolerated by our patients and does the job it is intended to do - clean the colon. Dr. Christiana Cower is better able to successfully visualize your colon using this prep, which is the  whole reason you are having a colonoscopy!  Preps of the old days were a much larger volume and harder for patients to get down, while also producing only a marginally clean colon. These preps are still available, but their use limits the success of the procedure and may ultimately lead to additional tests for you including, but not limited to, a possible repeat colonoscopy with a better prep (eeek!).  Your insurance may or may not include SuPrep (or generic) on its drug formulary. Where, or if, it lands on your insurance formulary will dictate the out-of-pocket expense for you. We do not have this expense information in our office. That is determined by your pharmacy when they submit the medication order through  your insurance. Occasionally the insurance wants us  to obtain a preauthorization for the prep. Unfortunately, this is not possible since it required us  to justify the prescription based on the failure of other formulary medications they do cover. Dr. Christiana Cower will not purposely prescribe a suboptimal prep and put you through a colonoscopy only to prove you failed that prep and need SuPrep (or generic) for another colonoscopy!   What we have discovered is that insurance drug plans will almost always be willing to pay for the older forms of bowel preps since they are cheaper to produce. There are also newer forms of bowel preps which drug plans may be willing to cover. But be assured, we have tried these preps with our patients and have found none that compare to the effectiveness of SuPrep (or generic).   Ultimately, we know a colon procedure is not high on your list of things you enjoy. Dr. Christiana Cower strongly prefers SuPrep (or generic) for all of his colon procedures because of its effectiveness. He receives no benefit from prescribing it short of providing the best process and outcome for his patients. Should you find that the cost is not within your budget, feel free to contact other pharmacies and even medication discount plans to find the most cost-effective location available. If you still cannot afford SuPrep (or generic), let us  know. We will discuss the secondary options with Dr. Christiana Cower on a case-by-case basis. However, you should be aware that the alternative prescription comes with the risk of a suboptimal colon cleanse and may lead to additional recommendations for testing or repeat colonoscopy.   Our goal is to get this right the first time!  Dr. [facility]

## 2023-08-29 NOTE — Progress Notes (Signed)
 Rachel [name] is a 38 y.o. female here for an endo consult
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
## 2023-09-19 ENCOUNTER — Ambulatory Visit: Primary: MD

## 2023-09-19 ENCOUNTER — Inpatient Hospital Stay: Admit: 2023-09-19 | Discharge: 2024-01-16 | Attending: MD | Primary: MD

## 2023-09-19 DIAGNOSIS — N6032 Fibrosclerosis of left breast: Principal | ICD-10-CM

## 2023-09-19 DIAGNOSIS — R928 Other abnormal and inconclusive findings on diagnostic imaging of breast: Principal | ICD-10-CM

## 2023-09-21 ENCOUNTER — Inpatient Hospital Stay: Admit: 2023-09-21 | Discharge: 2023-09-22 | Disposition: A | Discharge status: Home / Self Care

## 2023-09-21 ENCOUNTER — Emergency Department: Admit: 2023-09-22 | Discharge: 2024-01-30 | Primary: MD

## 2023-09-21 ENCOUNTER — Other Ambulatory Visit: Admit: 2023-09-21 | Primary: MD

## 2023-09-21 ENCOUNTER — Emergency Department: Admit: 2023-09-22 | Primary: MD

## 2023-09-21 ENCOUNTER — Ambulatory Visit: Admit: 2023-09-21 | Discharge: 2023-09-21 | Attending: Family | Primary: MD

## 2023-09-21 VITALS — BP 110/67 | HR 71 | Temp 97.30000°F | Resp 16 | Ht 65.0 in | Wt 184.8 lb

## 2023-09-21 DIAGNOSIS — M7989 Other specified soft tissue disorders: Principal | ICD-10-CM

## 2023-09-21 DIAGNOSIS — R252 Cramp and spasm: Principal | ICD-10-CM

## 2023-09-21 LAB — URINALYSIS, MACROSCOPIC (DIP ONLY) (SLM POC)
Bilirubin, Urine: NEGATIVE
Blood, Urine: NEGATIVE
Glucose, Urine: NEGATIVE mg/dL
Ketones, Urine: NEGATIVE mg/dL
Leukocyte Esterase, Urine: NEGATIVE
Nitrite, Urine: NEGATIVE
Protein, Urine: NEGATIVE mg/dL
Specific Gravity, Urine: 1.01 (ref 1.005–1.030)
Urobilinogen, Urine: 0.2 mg/dL (ref 0.0–2.0)
pH, UA: 7 (ref 5.0–7.5)

## 2023-09-21 LAB — CBC WITH AUTO DIFFERENTIAL
Basophils %: 0 % (ref 0–2)
Basophils, Absolute: 0 10*3/ÂµL (ref 0.0–0.1)
Eosinophils %: 1 % (ref 0–5)
Eosinophils, Absolute: 0 10*3/ÂµL (ref 0.0–0.4)
HCT: 44.7 % (ref 35.0–48.0)
Hemoglobin: 15.6 g/dL (ref 11.7–16.5)
Lymphocytes %: 27 % (ref 20–40)
Lymphocytes, Absolute: 2 10*3/ÂµL (ref 1.0–4.0)
MCH: 30.2 pg (ref 28.3–33.3)
MCHC: 34.9 g/dL (ref 32.5–36.0)
MCV: 86.6 fL (ref 81.0–100.0)
MPV: 7.7 fL (ref 6.9–10.0)
Monocytes %: 7 % (ref 2–12)
Monocytes, Absolute: 0.5 10*3/ÂµL (ref 0.2–1.0)
Neutrophils %: 65 % (ref 50–74)
Neutrophils, Absolute: 4.7 10*3/ÂµL (ref 2.0–7.4)
Platelet Count: 165 10*3/ÂµL (ref 150–405)
RBC: 5.17 10*6/ÂµL (ref 3.80–5.60)
RDW: 12.3 % (ref 11.7–16.1)
WBC: 7.3 10*3/ÂµL (ref 4.5–11.0)

## 2023-09-21 LAB — BASIC METABOLIC PANEL
Anion Gap: 11.1 mmol/L (ref 9.0–18.0)
BUN / Creatinine Ratio: 16.4 (ref 12.0–20.0)
BUN: 11 mg/dL (ref 8–20)
CO2 - Carbon Dioxide: 24 mmol/L (ref 17–27)
Calcium: 9.1 mg/dL (ref 8.6–10.6)
Chloride: 102.9 mmol/L (ref 101.0–111.0)
Creatinine: 0.67 mg/dL (ref 0.60–1.30)
Glomerular Filtration Rate Estimate (Female): >90 mL/min/1.73m*2 (ref >=60)
Glucose: 80 mg/dL (ref 74–106)
Osmolality Calculation: 274.1 mosm/kg — ABNORMAL LOW (ref 275.0–300.0)
Potassium: 3.9 mmol/L (ref 3.50–5.10)
Sodium: 138 mmol/L (ref 135–145)

## 2023-09-21 LAB — D-DIMER, QUANTITATIVE: D Dimer, Quantitative: 301 ng/mL — ABNORMAL HIGH (ref <230)

## 2023-09-21 LAB — MAGNESIUM: Magnesium: 2.1 mg/dL (ref 1.3–2.5)

## 2023-09-21 MED ORDER — cyclobenzaprine (FLEXERIL) 10 mg tablet
10 | ORAL_TABLET | Freq: Three times a day (TID) | ORAL | 2 refills | 30.00000 days | Status: DC | PRN
Start: 2023-09-21 — End: 2023-09-28

## 2023-09-21 MED ORDER — meloxicam (MOBIC) 15 mg tablet
15 | ORAL_TABLET | Freq: Every day | ORAL | 2 refills | 30.00000 days | Status: DC
Start: 2023-09-21 — End: 2023-09-28

## 2023-09-21 NOTE — Progress Notes (Signed)
 Rachel [name] is a 38 y.o. female who presents today for   Chief Complaint   Patient presents with    Flank Pain     x2wks       BP: 110/67 / Pulse: 71 / SpO2: 98 % / Temp: 36.3 ?C (97.3 ?F)      MEDICATIONS ADMINISTERED-LAST 24 Hours  (last 24 hrs)           ** No medications to display **            Ambulatory Nursing Form

## 2023-09-21 NOTE — ED Notes (Signed)
 Bed: 528-01  Expected date:   Expected time:   Means of arrival: Scientist, research (medical))  Comments:

## 2023-09-21 NOTE — Discharge Instructions (Addendum)
 follow-up with primary care for continued evaluation.  If she has worsening symptoms or has red flag symptoms such as shortness of breath, chest pain, rapid heart rate, palpitations, or any other concerns she may return to the emergency department.
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
## 2023-09-21 NOTE — Addendum Note (Signed)
 Addended by: AUDIE ONEIDA OLIVES on: 09/21/2023 03:37 PM     Modules accepted: Orders
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
## 2023-09-21 NOTE — ED Triage Notes (Signed)
 Pt sent from clinic for DVT rule out r/t bilateral leg pain with elevated d dimer

## 2023-09-21 NOTE — ED Notes (Signed)
 Discharge instructions reviewed. Patient instructed to monitor symptoms, follow up with PCP and return if symptoms worsen or persist. No questions or concerns at this time.

## 2023-09-21 NOTE — Telephone Encounter (Signed)
 I did call patient and discussed her lab results with her.  We did try to order a stat DVT ultrasound in the clinic today, but we were unable to accommodate this on an outpatient basis due to the vascular tech leaving for the day.  At this point in time I did recommend that patient go to the emergency department to have the DVT ultrasound completed.  Patient is aware and will head to the emergency department.

## 2023-09-22 LAB — MYOGLOBIN: Myoglobin: 46 ng/mL (ref 25–58)

## 2023-09-22 LAB — CK: CK (Creatine Kinase): 48 IU/L (ref 26–192)

## 2023-09-22 LAB — PRO B-TYPE NATRIURETIC PEPTIDE (SLM): Pro B-Type Natriuretic Peptide: <36 pg/mL (ref 0–450)

## 2023-09-22 LAB — CKMB: CKMB: <1 ng/mL (ref 0.6–6.3)

## 2023-09-22 LAB — TROPONIN T HIGH SENSITIVITY (SLM): Troponin T HS: <6 ng/L (ref <14)

## 2023-09-24 NOTE — Addendum Note (Signed)
 Addended by: OLIVEIRA, CHRISTINE on: 09/24/2023 02:51 PM     Modules accepted: Orders

## 2023-09-25 NOTE — Other (Signed)
 95-month follow-up left breast ultrasound dated 09/19/2023, was obtained after an ultrasound-guided left upper inner quadrant breast biopsy performed on 03/16/2023 for 6 mm BI-RADS Category 3 lesion with benign pathology results, demonstrates postbiopsy change and fibrosis and is read as benign, BI-RADS Category 2 per radiology.  My office to contact patient with results.  No specific follow-up is required at this time.  My office to forward a copy of this note to Dr. Lenda

## 2023-09-26 NOTE — Telephone Encounter (Signed)
 This patient was recently discharged from the hospital or ED. Please contact patient to determine what follow up may be needed.   (Please remember to use [facility] ED follow up or [facility] hospital follow up call smart text when making contact). Thank you    Coralyn Mark, CCMA

## 2023-09-27 NOTE — Telephone Encounter (Signed)
 Spoke to patient, notified that her 6 month f/u left breast ultrasound demonstrates postbiopsy change and fibrosis and is read as benign, BI-RADS Category 2 per radiology, No specific follow-up is   required at this time.    Copy to PCP.

## 2023-09-27 NOTE — Telephone Encounter (Signed)
 Emergency Room Follow up Care Coordination Phone Call    ERE:ZMPW LENDA, MD    Date of ED visit:09/21/23  Reason for ED visit: leg swelling      Telephone call to patient to follow up recent Emergency Room visit. Spoke with Harlene.    Overall, how are you (or the patient) doing now?   Patient states she is doing good. Her legs are stilling hurting, feeling achy     Were there any changes or new medications added during Emergency Room visit or when you were discharged?  Not in the ED but yes in the drop in    Do you have all of your medications? Have you started taking them?    N/a    Do you have any concerns about being able to take care of your needs?  none    Any other questions or concerns?   none    Follow up appointment, (patient reminded to bring discharge paperwork and all medication bottles/packages to appointment):   Future Appointments   Date Time Provider Department Center   10/03/2023  9:40 AM Noe Roers, NP [facility]   10/10/2023 10:40 AM Kayleigh Noelle Grimes, PA-C [facility]          Assessment/Plan (any follow up labs, tests or referrals?):   F/u scheduled with Margean Rakes. No sooner appointment with PCP or team. Patient refused to see Dr. Pearle.

## 2023-09-27 NOTE — Telephone Encounter (Signed)
-----   Message from [facility] sent at 09/25/2023  3:12 PM PDT -----  60-month follow-up left breast ultrasound dated 09/19/2023, was obtained after an ultrasound-guided left upper inner quadrant breast biopsy performed on 03/16/2023 for 6 mm BI-RADS Category 3 lesion with   benign pathology results, demonstrates postbiopsy change and fibrosis and is read as benign, BI-RADS Category 2 per radiology.  My office to contact patient with results.  No specific follow-up is   required at this time.  My office to forward a copy of this note to Dr. Lenda  ----- Message -----  From: Interface, Rad Results In  Sent: 09/20/2023   9:33 AM PDT  To: Oliva JONETTA Hire, MD
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
## 2023-10-01 NOTE — Telephone Encounter (Signed)
 Left a detailed message for patient, should be on a liquid diet today. Check in time of 11:45 tomorrow at [facility].  Can return my call with any questions or concerns.

## 2023-10-02 MED ORDER — lidocaine (PF) (XYLOCAINE) 20 mg/mL (2 %) injection
20 | INTRAMUSCULAR | Status: AC
Start: 2023-10-02 — End: ?

## 2023-10-02 MED ORDER — sodium chloride 0.9 % (NS) infusion
INTRAVENOUS | Status: DC
Start: 2023-10-02 — End: 2023-10-02
  Administered 2023-10-02 (×5): via INTRAVENOUS

## 2023-10-02 MED ORDER — propofoL (DIPRIVAN) injection
10 | INTRAVENOUS | Status: DC | PRN
Start: 2023-10-02 — End: 2023-10-02
  Administered 2023-10-02 (×5): 10 mg/mL via INTRAVENOUS

## 2023-10-02 MED ORDER — propofoL (DIPRIVAN) 10 mg/mL injection
10 | INTRAVENOUS | Status: AC
Start: 2023-10-02 — End: ?

## 2023-10-02 MED ORDER — lidocaine 20 mg/mL (2 %) injection
20 | INTRAMUSCULAR | Status: DC | PRN
Start: 2023-10-02 — End: 2023-10-02
  Administered 2023-10-02: 20:00:00 20 mg/mL (2 %) via INTRAVENOUS

## 2023-10-02 NOTE — OR Nursing (Signed)
 Patient AOx4. Vital signs stable. Tolerating prescribed diet and denies nausea. Denies pain. Patient is able to sit and stand without dizziness.  Discharge instructions reviewed with patient and family and a hard copy provided. Patient verbalizes understanding. All questions have been answered. Patient discharged to home, ambulatory with escort.

## 2023-10-02 NOTE — Anesthesia Pre-Procedure Evaluation (Signed)
 Anesthesia Evaluation     Patient summary reviewed      Airway   Mallampati: I  TM distance: 6.5 - 8 cm  Neck ROM: full  ULBT: I    Dental    (+) age appropriate    Pulmonary - negative ROS    breath sounds clear to auscultation  Cardiovascular   Exercise tolerance: good (4-7 METS)    ECG reviewed  Rhythm: regular  Rate: normal  ROS comment: History of frequent PVCs    Neuro/Psych    (+) headaches    GI/Hepatic/Renal      Endo/Other     Musculoskeletal                      Anesthesia Plan    ASA 2     MAC     intravenous induction         Anesthetic plan, risks and benefits discussed with patient.  Consenting person understands and agrees to proceed. anesthesia consent form used. PARQ.  Pre-Anesthesia Evaluation Completed at:  10/02/2023 12:58 PM

## 2023-10-02 NOTE — OR Nursing (Incomplete)
 Patient AOx4. Vital signs stable. Tolerating prescribed diet and denies nausea. Denies pain. Patient is able to sit and stand without dizziness.  Discharge instructions reviewed with patient and family and a hard copy provided. Patient verbalizes understanding. All questions have been answered. Patient discharged to home, ambulatory with escort.

## 2023-10-02 NOTE — Discharge Instructions (Signed)
 Outpatient Post-op Discharge Instructions-Colonoscopy/Endoscopy   Please contact the physican at [phone] if you have any questions or concerns  OR if any of the following  Conditions occur:     Signs/Symptoms or other problems to report:   Fever over 101  Severe/increasing abdominal pain or bloating  Bleeding or black stools  Severe pain with, or difficulty swallowing Continuous nausea and vomiting or blood noted in your vomit (blood from the stomach may look like coffee grounds or be bright red in color.)     ACTIVITY   Due to General Anesthesia:   You are advised to go directly home and rest for the day. During your procedure you were given sedatives that may make you feel tired and/or impair your balance and coordination  Do not drink alcohol for 24 hours. Driving or operating machinery takes concentration and the ability to respond rapidly; the sedatives adversely affect both. State law prohibits driving under the influence of drugs. If you have an engagement that cannot be cancelled, we advise that you have someone drive you.   Do not sign contracts or legal documents for 24 hours. The sedatives slow down your body and your mind. Your ability to objectively evaluate may be impaired.      If you had a colonoscopy:   You may feel bloated after the procedure because of the air that was introduced during the examination.   You may not have a bowel movement for 1-3 days because of the colonoscopy prep. This is normal.   Avoid gas causing food and drinks today.      If you had an upper endoscopy:    Your throat may be a little sore for a few hours. You may also feel bloated for a period of time after the procedure because of the air that was introduced to examine the stomach.

## 2023-10-02 NOTE — Op Note (Signed)
 Operative Note       Preoperative Diagnoses:   Change in bowel habits  Blood in stool  Left inguinal pain     Postoperative Diagnoses:  Post-Op Diagnosis Codes:     * Change in bowel habits [R19.4]     * Blood in stool [K92.1]     * Left inguinal pain [R10.32]          Minor grade 1 internal hemorrhoids        Normal-appearing colon to the level of the deep cecum other than melanosis coli    Procedure Proposed: Colonoscopy with intervention as indicated    Procedure Performed: Colonoscopy to level of the deep cecum    Surgeons: Surgeons and Role:     * Oliva JONETTA Hire, MD - Primary    Assist: * No surgical staff found *     Anesthesia Provider: Anesthesiologist: [name], MD    Anesthesia Type: Monitor Anesthesia Care    Indications: Patient is a 38 year old female who presents with lower abdominal pain streaky blood in her stool and constipation.  She has had multiple laparoscopies and what appears to be lysis of adhesions along with a hysterectomy appendectomy and cholecystectomy.  She has now had several months of left inguinal pain and narrowing of the caliber of her stools as well as streaky blood in the stools which she feels may be hemorrhoids.  She has issues with chronic constipation for which she uses multimodality therapy with improvement.  She underwent a CT scan on 06/25/2023 which showed no concerning findings, bowel abnormalities, or hernias.  Denies a family history of colon cancer or inflammatory bowel disease.  At this time she presents for colonoscopy.    Findings: Patient is brought to the endoscopy suite, placed in the supine position, IV access is confirmed and monitors are placed.  Patient tolerated propofol  sedation.  Timeout was held and staff, myself, and anesthesia agreed we had the correct patient and the correct procedure.  She was situated left lateral decubitus position, rectal exam was performed and demonstrated minor grade 1 internal hemorrhoids.  No fissures.  No stenosis.   Prelubricated Olympus PCF 190 pediatric colonoscope was then inserted into the rectum and with moderate insufflation, moderate abdominal bracing, significant paradoxical movement of the scope, and two position changes advanced to a long, floppy, and highly redundant colon to the level of the deep cecum.  This was identified by normal landmarks including confluence of tenia, ileocecal valve and lack of appendiceal orifice consistent with appendectomy.  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  No evidence of inflammation.  Prep was good and visualization was good.  A moderate amount of retained fluid was removed as it was encountered.  Once the cecum was reached and cleared of retained fluid, scope was gradually withdrawn, surveying the colon at 360 degree fashion.  Withdrawal time was documented by cecal and rectal time stamped photos.  No abnormalities were noted.  Retained fluid was removed as it was encountered.  Small area of mucosal bruising was noted at the level of the rectosigmoid junction at 15 cm.  No evidence of full-thickness injury.  No bleeding.  Retroflexion view in the rectum demonstrated normal distal rectum other than mild hemorrhoids.  Scope was straightened, colon decompressed and scope removed.    Complications: None Apparent    I&O: See Anesthesia Record    EBL: None     Specimens: none    Implants:* No implants in log *  Disposition: Patient tolerated the procedure well and will be transported to recovery in good condition.  She will then discharge home when criteria met.  At this time I feel her streaky blood in the stools is secondary to hemorrhoids and local hemorrhoidal care is recommended.  No specific follow-up with me is required at this time.  She should have a repeat screening colonoscopy at age 89.  Findings and recommendations are reviewed with her prior to discharge.    OLIVA JONETTA HIRE, MD MD, FACS - General Surgery  10/02/2023, 1:55 PM

## 2023-10-02 NOTE — Discharge Summary (Signed)
 Outpatient Final Progress Note    Final diagnoses: Lower abdominal pain, blood in stools, change in bowel habits, melanosis coli    Condition: Good, stable    Disposition: Home, Self-care    Follow-up care: My office as needed      OLIVA JONETTA HIRE, MD MD, FACS - General Surgery  10/02/2023  2:02 PM      Note:    A portion, if not all, of the transcription provided by me in this EHR was performed with a voice recognition system.    MDO

## 2023-10-02 NOTE — Anesthesia Post-Procedure Evaluation (Signed)
 Patient Name: Rachel [name]  Procedures performed: Procedure(s):  COLONOSCOPY    Last Vitals:   Vitals Value Taken Time   BP 106/64 10/02/23 14:20   Pulse 69 10/02/23 14:20   Resp 16 10/02/23 14:20   SpO2 99 % 10/02/23 14:20   Temp 36.2 ?C 10/02/23 13:56       Planned Anesthesia Type: MAC  Final Anesthesia Type: MAC  Patients Current Location: PACU  Level of Consciousness: awake  Post Procedure Pain:adequate analgesia  Airway: Patent  Respiratory Status: spontaneous ventilation  Cardio Status: hemodynamically stable  Hydration: adequately hydrated  PONV? NO  no anesthesia complication              The patient was able to participate in the post op evaluation    Comments:        [name], MD  2:26 PM

## 2023-10-03 ENCOUNTER — Ambulatory Visit
Admit: 2023-10-03 | Discharge: 2023-10-03 | Attending: Student in an Organized Health Care Education/Training Program | Primary: MD

## 2023-10-03 DIAGNOSIS — F909 Attention-deficit hyperactivity disorder, unspecified type: Principal | ICD-10-CM

## 2023-10-03 NOTE — Progress Notes (Signed)
 Rachel [name] is a 38 y.o. female who presents today for follow-up appointment    BP: 116/78 / Pulse: 62 / SpO2: 99 % / Temp: 36.7 ?C (98.1 ?F)    Screening:   Depression: PHQ-9 Total: 7 (10/03/2023  9:44 AM)    }   Anxiety: GAD7 Total: 6 (10/03/2023  9:44 AM)    Cognitive Assessment:   PTSD: PCL-5: In the past month, how much were you bothered by:  Repeated, disturbing, and unwanted memories of the stressful experience?: Not at all  Repeated, disturbing dreams of the stressful experience?: Not at all  Suddenly feeling or acting as if the stressful experience were actually happening again (as if you were actually back there reliving it)?: Not at all  Feeling very upset when something reminded you of the stressful experience?: Not at all  Having strong physical reactions when something reminded you of the stressful experience (for example, heart pounding, trouble breathing, sweating)?: Not at all  Avoiding memories, thoughts, or feelings related to the stressful experience?: Not at all  Avoiding external reminders of the stressful experience (for example, people, places, conversations, activities, objects, or situations)?: Not at all  Trouble remembering important parts of the stressful experience?: Not at all  Having strong negative beliefs about yourself, other people, or the world (for example, having thoughts such as: I am bad, there is something seriously wrong with me, no one can be trusted, the world is completely dangerous)?: Not at all  Blaming yourself or someone else for the stressful experience or what happened after it?: Not at all  Having strong negative feelings such as fear, horror, anger, guilt, or shame?: Not at all  Loss of interest in activities that you used to enjoy?: Not at all  Feeling distant or cut off from other people?: Not at all  Trouble experiencing positive feelings (for example, being unable to feel happiness or have loving feelings for people close to you)?: Not at all  Irritable  behavior, angry outbursts, or acting aggressively?: A little bit  Taking too many risks or doing things that could cause you harm?: Not at all  Being superalert or watchful or on guard?: Not at all  Feeling jumpy or easily startled?: Moderately  Having difficulty concentrating?: Not at all  Trouble falling or staying asleep?: Not at all  Post-Traumatic Stress Disorder Total Score: 3              Screening  Carelessness: Moderate  Difficulty sustaining attention: Severe  Doesn't listen: Severe  No follow through: Severe  Can't organize: Moderate  Avoids/dislikes tasks requiring sustained mental effort: Severe  Loses important items: Severe  Easily distractible: Severe  Forgetful in daily activities: Severe  Squirms and fidgets: Severe  Can't stay seated: Moderate  Runs/climbs excessively: Moderate  Can't work/play quietly: None  On the go, driven by a motor: Mild  Talks Excessively: Moderate  Blurts out answers: Severe  Can't wait for turn: Severe  Intrudes/interrupts others: Moderate  Adult ADHD RS-IV Total Score: 43  Interpretation: ADHD likely. Needs full assessment.                   Ambulatory Nursing Form     This dictation was produced using voice-recognition software.  Despite concurrent proofreading, please note that transcription errors are common and may not reflect the true intent of the examiner.

## 2023-10-03 NOTE — Patient Instructions (Signed)
 Please take medication as prescribed. Call clinic if experiencing side effects.  Please call clinic or 911 or the ER if pt experiencing any self-harming thoughts, SI or HI, or with general concern or question.      Therapy Recommend individual therapy with a focus on:    - Cognitive behavioral therapy (CBT)- helps people identify negative and unhelpful thoughts and behaviors and how they can change these into ones that are more helpful and sustainable.    - Supportive counseling- aims to build your self-esteem and confidence via active listening, emotional support, and validation.      Sleep and lifestyle  Please practice sleep hygiene measures including having a bedtime routine with a set bedtime and wake up time, no caffeine at least 6 hours before bed, avoid screen time at least 90 minutes before going to bed, avoid napping during the day, keep bedroom cool, dark and quiet, avoid eating heavy meals within 3 hours of bedtime and getting physical exercise every day at least 4-5 hours before bed.  Encourage you to eat a healthy diet with many vegetables and fruits.      Emergency numbers I can call:   911   Emergency Department: [phone]  [facility]: [phone] Monday through Thursday 8 AM- 5 PM.  988- Call, text or chat online  [facility] Crisis Line: [phone]  National suicide prevention hotline; 548 800 3699.
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
## 2023-10-10 ENCOUNTER — Encounter: Primary: MD

## 2023-11-22 MED ORDER — metFORMIN ER (GLUCOPHAGE-XR) 500 mg 24 hr tablet
500 | ORAL_TABLET | Freq: Two times a day (BID) | ORAL | 3 refills | 30.00000 days | Status: AC
Start: 2023-11-22 — End: 2024-02-20

## 2023-11-22 MED ORDER — naltrexone-buPROPion 8-90 mg TbER
8-90 | ORAL_TABLET | Freq: Every day | ORAL | 3 refills | Status: DC
Start: 2023-11-22 — End: 2023-11-26

## 2023-11-22 NOTE — Telephone Encounter (Signed)
 Medication Prior Authorization Required:              Medication: naltrexone -buPROPion  8-90 mg TbER   Completed Via:( CoverMyMeds, Phone or Fax): Epic (check status of PA in Referrals tab)     Prior authorization has been submitted. Patient will receive a determination letter from insurance once decision has been made.     Rachel [name] [id number]

## 2023-11-23 NOTE — Telephone Encounter (Signed)
 [facility] is requesting to confirm dosing for naltrexone -buPROPion  8-90 mg TbER. Pharmacy states medication usually titrates up to 2BID:    Pharmacy comment: PLEASE CONFIRM DOSING. USUALLY TITRATES UP TO 2BID.  PLEASE CONFIRM DOSING. USUALLY TITRATES UP TO 2BID.       Franky  [phone]

## 2023-11-26 NOTE — Telephone Encounter (Signed)
 Fixed it.    1. Obesity (BMI 30.0-34.9)  - naltrexone -buPROPion  (CONTRAVE ) 8-90 mg TbER; Take 2 tablets by mouth 2 times daily.  Dispense: 360 tablet; Refill: 3

## 2023-12-19 ENCOUNTER — Inpatient Hospital Stay: Attending: MD | Primary: MD

## 2023-12-25 DIAGNOSIS — M19012 Primary osteoarthritis, left shoulder: Secondary | ICD-10-CM

## 2023-12-25 DIAGNOSIS — M12812 Other specific arthropathies, not elsewhere classified, left shoulder: Principal | ICD-10-CM

## 2023-12-26 ENCOUNTER — Inpatient Hospital Stay: Admit: 2023-12-26 | Discharge: 2023-12-26 | Attending: MD | Primary: MD

## 2024-01-01 NOTE — Telephone Encounter (Signed)
 Referral placed.    1. Rotator cuff tendinitis, left (Primary)  - Referral to Orthopedic Surgery ([facility])

## 2024-01-09 ENCOUNTER — Encounter: Primary: MD

## 2024-01-15 ENCOUNTER — Ambulatory Visit: Admit: 2024-01-15 | Discharge: 2024-01-15 | Primary: MD

## 2024-01-15 ENCOUNTER — Inpatient Hospital Stay: Admit: 2024-01-15 | Discharge: 2024-01-21 | Primary: MD

## 2024-01-15 VITALS — BP 105/61 | HR 71 | Temp 99.20000°F | Ht 65.0 in | Wt 189.4 lb

## 2024-01-15 DIAGNOSIS — M25512 Pain in left shoulder: Secondary | ICD-10-CM

## 2024-01-15 LAB — HEPATITIS C VIRUS ANTIBODY W/ REFLEX HCV RNA PCR (SLM): Hepatitis C Virus Antibody Interpretation: NONREACTIVE

## 2024-01-15 LAB — HEPATITIS B SURFACE ANTIBODY (SLM)
Hepatitis B Surface Antibody Index: 14.7 m[IU]/mL
Hepatitis B Surface Antibody Interpretation: POSITIVE — AB

## 2024-01-15 LAB — HIV ANTIBODY AND ANTIGEN (SLM): HIV Ab/Ag Interpretation: NEGATIVE

## 2024-01-15 LAB — ALT: ALT - Alanine Aminotransferase: 15 IU/L (ref 5–33)

## 2024-01-15 MED ORDER — METHYLPREDNISOLONE 4 MG TABLETS IN A DOSE PACK
4 | ORAL_TABLET | ORAL | 0 refills | 6.00000 days | Status: AC
Start: 2024-01-15 — End: 2024-01-22

## 2024-01-15 NOTE — Progress Notes (Signed)
 Rachel [name] is a new patient complaining of left shoulder pain she has had for a couple of months with no inciting injury.  Pain is global in nature with some radiation down the posterior aspect of her arm.  She has tried treating this with acetaminophen , multiple anti-inflammatories, Lidoderm  patches and Flexeril .  Reports no history of surgeries on the shoulder.  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  She is a Artist.  Otherwise reasonably healthy.    The shoulder is without hyperemia or deformity.  Active range of motion: Forward elevation 120 degrees, abduction 90 degrees.  Passively I can forward elevate to 140 degrees and abduct to 90 degrees.  Tolerates 45 degrees of external rotation and 90 degrees of internal rotation.  There is point tenderness over the lateral acromion as well as the [facility] joint.  She also is tender over the bicipital groove but not over the anterior joint line of the glenohumeral joint.  Strength of the rotator cuff is 5/5 throughout, she has a positive Hawkins and Neer sign.  Extremity is neurovascularly intact.    X-rays obtained in the clinic show mild AC arthrosis, there is no arthritic changes noted of the glenohumeral joint.  No fractures or dislocations are identified and the joint is located.    Previously obtained MRI shows supraspinatus tendinosis mild distal subscapularis tendinosis with a small partial-thickness intrasubstance tear.  She does have some tendinosis of the long head of the biceps tendon as well.    Patient has pain in her shoulder consistent with the above-noted tendinitis.  We discussed possible treatment options including anti-inflammatories, steroid injection and/or physical therapy referral.  Patient has not received much benefit from use of multiple anti-inflammatories and is not  interested in trying any more of these.  She is somewhat anxious about the steroid injection and would like to try a Medrol Dosepak which I do not think is unreasonable.  This was prescribed to the pharmacy of her choice.  She was given some Thera-Band's and a home exercise program in addition to a physical therapy referral.  I discussed with her that it will take several months for her to improve and discussed activity modifications.  Follow-up in another 6 weeks for repeat evaluation.  Patient verbalized understanding and is in agreement with this plan.
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
## 2024-01-31 LAB — LIPID PANEL W/ REFLEX DIRECT LDL
Chol/HDL Ratio: 3 (ref 0.0–5.0)
Cholesterol, HDL: 50 mg/dL — ABNORMAL LOW (ref >=60)
Cholesterol: 149 mg/dL (ref <=200)
LDL Calculated: 83 mg/dL (ref 0–129)
Triglyceride: 81.5 mg/dL (ref <=150.0)
VLDL Cholesterol Calculation: 16.3 mg/dL (ref 7.0–32.0)

## 2024-01-31 LAB — GLYCO-HEMOGLOBIN A1C: Glycohemoglobin (A1c): 5.1 % (ref <=5.6)

## 2024-01-31 NOTE — Result Encounter Note (Signed)
Labs drawn as part of [employer] Wellness Incentive enrollment.  They are essentially normal, and no changes are likely needed.  I am just making sure you are aware!    The ASCVD Risk score (Arnett DK, et al., 2019) failed to calculate.

## 2024-02-05 ENCOUNTER — Encounter: Payer: Self-pay | Admitting: Emergency Medicine

## 2024-02-05 ENCOUNTER — Ambulatory Visit
Admission: EM | Admit: 2024-02-05 | Discharge: 2024-02-05 | Disposition: A | Discharge status: Home / Self Care | Attending: Nurse Practitioner | Admitting: Nurse Practitioner

## 2024-02-05 DIAGNOSIS — N390 Urinary tract infection, site not specified: Secondary | ICD-10-CM | POA: Insufficient documentation

## 2024-02-05 DIAGNOSIS — N3001 Acute cystitis with hematuria: Secondary | ICD-10-CM | POA: Diagnosis not present

## 2024-02-05 LAB — POCT URINE DIPSTICK
Bilirubin, UA: NEGATIVE
Glucose, UA: NEGATIVE mg/dL
Ketones, POC UA: NEGATIVE mg/dL
Leukocytes, UA: NEGATIVE
Nitrite, UA: NEGATIVE
POC PROTEIN,UA: NEGATIVE
Spec Grav, UA: 1.025 (ref 1.010–1.025)
Urobilinogen, UA: 0.2 U/dL
pH, UA: 5.5 (ref 5.0–8.0)

## 2024-02-05 MED ORDER — SULFAMETHOXAZOLE-TRIMETHOPRIM 800-160 MG PO TABS: 1.0000 | ORAL_TABLET | Freq: Two times a day (BID) | ORAL | 0 refills | Status: AC

## 2024-02-05 NOTE — Discharge Instructions (Signed)
 The urine sample today is cloudy and shows that there is blood in the urine.  As we discussed, I suspect you have a urinary tract infection.  Take the Bactrim  twice daily for 7 days as prescribed to treat it.  Recommend close follow-up with urology, GYN, and/or primary care provider if symptoms do not improve with treatment.  To find a primary care provider locally, please go to [email or web address] to make an appointment.

## 2024-02-05 NOTE — ED Triage Notes (Signed)
 Urinary pressure and lower back pain x 2 days.  Some burning on urination.

## 2024-02-05 NOTE — ED Provider Notes (Signed)
 [facility]    CSN: [id number] Arrival date & time: 02/05/24  1140      History   Chief Complaint No chief complaint on file.   HPI Tracey Young is a 38 y.o. female.   Patient presents today with 2 day history of burning with urination, increased urinary frequency, urinary urgency, voiding smaller amounts, and lower abdominal pain.  Reports at times, the abdominal pain wraps around to both sides of her low back.  No foul urinary odor, flank pain, hematuria, fever, nausea/vomiting, or vaginal discharge.  She reports history of frequent UTI, last was 2 months ago.  Does not recall the last antibiotic she took UTI.  Patient recently moved to this area from [place] .  Reports she was referred to a nephrologist in the past for recurrent UTI.    Past Medical History:  Diagnosis Date   S/P VP shunt     There are no active problems to display for this patient.   Past Surgical History:  Procedure Laterality Date   ABDOMINAL SURGERY  2020   scheduled fallopian tube removal but surgeon couldn't find her tubes   VENTRICULOPERITONEAL SHUNT      OB History   No obstetric history on file.      Home Medications    Prior to Admission medications   Medication Sig Start Date End Date Taking? Authorizing Provider  sulfamethoxazole -trimethoprim  (BACTRIM  DS) 800-160 MG tablet Take 1 tablet by mouth 2 (two) times daily for 7 days. 02/05/24 02/12/24 Yes Chandra Harlene LABOR, NP    Family History Family History  Problem Relation Age of Onset   Hypertension Mother    Cancer Mother    Breast cancer Mother    Hypertension Father    Breast cancer Paternal Aunt     Social History Social History   Tobacco Use   Smoking status: Never   Smokeless tobacco: Never  Vaping Use   Vaping status: Never Used  Substance Use Topics   Alcohol use: Not Currently   Drug use: Not Currently     Allergies   Patient has no known allergies.   Review of Systems Review of  Systems Per HPI  Physical Exam Triage Vital Signs ED Triage Vitals  Encounter Vitals Group     BP 02/05/24 1151 115/78     Girls Systolic BP Percentile --      Girls Diastolic BP Percentile --      Boys Systolic BP Percentile --      Boys Diastolic BP Percentile --      Pulse Rate 02/05/24 1151 76     Resp 02/05/24 1151 18     Temp 02/05/24 1151 98.5 F (36.9 C)     Temp Source 02/05/24 1151 Oral     SpO2 02/05/24 1151 99 %     Weight --      Height --      Head Circumference --      Peak Flow --      Pain Score 02/05/24 1152 4     Pain Loc --      Pain Education --      Exclude from Growth Chart --    No data found.  Updated Vital Signs BP 115/78 (BP Location: Right Arm)   Pulse 76   Temp 98.5 F (36.9 C) (Oral)   Resp 18   LMP 01/27/2024 (Exact Date)   SpO2 99%   Visual Acuity Right Eye Distance:   Left Eye Distance:  Bilateral Distance:    Right Eye Near:   Left Eye Near:    Bilateral Near:     Physical Exam Vitals and nursing note reviewed.  Constitutional:      General: She is not in acute distress.    Appearance: She is not toxic-appearing.  HENT:     Mouth/Throat:     Mouth: Mucous membranes are moist.     Pharynx: Oropharynx is clear.  Pulmonary:     Effort: Pulmonary effort is normal. No respiratory distress.  Abdominal:     General: Abdomen is flat. Bowel sounds are normal. There is no distension.     Palpations: Abdomen is soft. There is no mass.     Tenderness: There is no abdominal tenderness. There is no right CVA tenderness, left CVA tenderness or guarding.  Skin:    General: Skin is warm and dry.     Coloration: Skin is not jaundiced or pale.     Findings: No erythema.  Neurological:     Mental Status: She is alert and oriented to person, place, and time.     Motor: No weakness.     Gait: Gait normal.  Psychiatric:        Behavior: Behavior is cooperative.      UC Treatments / Results  Labs (all labs ordered are listed,  but only abnormal results are displayed) Labs Reviewed  POCT URINE DIPSTICK - Abnormal; Notable for the following components:      Result Value   Clarity, UA cloudy (*)    Blood, UA small (*)    All other components within normal limits  URINE CULTURE    EKG   Radiology No results found.  Procedures Procedures (including critical care time)  Medications Ordered in UC Medications - No data to display  Initial Impression / Assessment and Plan / UC Course  I have reviewed the triage vital signs and the nursing notes.  Pertinent labs & imaging results that were available during my care of the patient were reviewed by me and considered in my medical decision making (see chart for details).   Patient is well-appearing and vital signs are stable.  Urinalysis today shows cloudy urine with small amount of blood.  Per chart review, patient has baseline trace blood.  Also has had resistance to nitrofurantoin  in the past and urine culture and sensitivity.  Will treat patient for acute lower UTI with Bactrim  DS twice daily for 7 days given history of recurrent UTIs.  Urine cultures pending.  Strict ER precautions discussed.  Recommended follow-up with primary care or OB/GYN if symptoms do not improve with treatment.  Also discussed benefit of establishing care with urologist if UTIs continue to recur.  Contact information was provided today.  The patient was given the opportunity to ask questions.  All questions answered to their satisfaction.  The patient is in agreement to this plan.   Final Clinical Impressions(s) / UC Diagnoses   Final diagnoses:  Acute cystitis with hematuria  Recurrent UTI     Discharge Instructions      The urine sample today is cloudy and shows that there is blood in the urine.  As we discussed, I suspect you have a urinary tract infection.  Take the Bactrim  twice daily for 7 days as prescribed to treat it.  Recommend close follow-up with urology, GYN, and/or  primary care provider if symptoms do not improve with treatment.  To find a primary care provider locally, please go to  [email or web address] to make an appointment.    ED Prescriptions     Medication Sig Dispense Auth. Provider   sulfamethoxazole -trimethoprim  (BACTRIM  DS) 800-160 MG tablet Take 1 tablet by mouth 2 (two) times daily for 7 days. 14 tablet Chandra Harlene LABOR, NP      PDMP not reviewed this encounter.   Chandra Harlene LABOR, NP 02/05/24 [phone]

## 2024-02-06 LAB — URINE CULTURE: Culture: NO GROWTH

## 2024-02-08 ENCOUNTER — Ambulatory Visit (HOSPITAL_COMMUNITY): Payer: Self-pay

## 2024-02-17 ENCOUNTER — Emergency Department (HOSPITAL_COMMUNITY)

## 2024-02-17 ENCOUNTER — Other Ambulatory Visit: Payer: Self-pay

## 2024-02-17 ENCOUNTER — Encounter (HOSPITAL_COMMUNITY): Payer: Self-pay | Admitting: *Deleted

## 2024-02-17 ENCOUNTER — Emergency Department (HOSPITAL_COMMUNITY)
Admission: EM | Admit: 2024-02-17 | Discharge: 2024-02-17 | Disposition: A | Discharge status: Home / Self Care | Attending: Emergency Medicine | Admitting: Emergency Medicine

## 2024-02-17 DIAGNOSIS — R1031 Right lower quadrant pain: Secondary | ICD-10-CM

## 2024-02-17 DIAGNOSIS — R112 Nausea with vomiting, unspecified: Secondary | ICD-10-CM

## 2024-02-17 DIAGNOSIS — N200 Calculus of kidney: Secondary | ICD-10-CM | POA: Insufficient documentation

## 2024-02-17 DIAGNOSIS — N83292 Other ovarian cyst, left side: Secondary | ICD-10-CM | POA: Insufficient documentation

## 2024-02-17 DIAGNOSIS — N83202 Unspecified ovarian cyst, left side: Secondary | ICD-10-CM

## 2024-02-17 LAB — URINALYSIS, ROUTINE W REFLEX MICROSCOPIC
Bacteria, UA: NONE SEEN
Bilirubin Urine: NEGATIVE
Glucose, UA: NEGATIVE mg/dL
Ketones, ur: NEGATIVE mg/dL
Leukocytes,Ua: NEGATIVE
Nitrite: NEGATIVE
Protein, ur: NEGATIVE mg/dL
Specific Gravity, Urine: 1.023 (ref 1.005–1.030)
pH: 5 (ref 5.0–8.0)

## 2024-02-17 LAB — COMPREHENSIVE METABOLIC PANEL WITH GFR
ALT: 39 U/L (ref 0–44)
AST: 53 U/L — ABNORMAL HIGH (ref 15–41)
Albumin: 4.6 g/dL (ref 3.5–5.0)
Alkaline Phosphatase: 76 U/L (ref 38–126)
Anion gap: 14 (ref 5–15)
BUN: 11 mg/dL (ref 6–20)
CO2: 19 mmol/L — ABNORMAL LOW (ref 22–32)
Calcium: 9 mg/dL (ref 8.9–10.3)
Chloride: 103 mmol/L (ref 98–111)
Creatinine, Ser: 0.7 mg/dL (ref 0.44–1.00)
GFR, Estimated: >60 mL/min (ref >60)
Glucose, Bld: 91 mg/dL (ref 70–99)
Potassium: 3.9 mmol/L (ref 3.5–5.1)
Sodium: 136 mmol/L (ref 135–145)
Total Bilirubin: 0.6 mg/dL (ref 0.0–1.2)
Total Protein: 7.9 g/dL (ref 6.5–8.1)

## 2024-02-17 LAB — CBC WITH DIFFERENTIAL/PLATELET
Abs Immature Granulocytes: 0.02 K/uL (ref 0.00–0.07)
Basophils Absolute: 0 K/uL (ref 0.0–0.1)
Basophils Relative: 0 %
Eosinophils Absolute: 0 K/uL (ref 0.0–0.5)
Eosinophils Relative: 0 %
HCT: 36.4 % (ref 36.0–46.0)
Hemoglobin: 11.3 g/dL — ABNORMAL LOW (ref 12.0–15.0)
Immature Granulocytes: 0 %
Lymphocytes Relative: 5 %
Lymphs Abs: 0.3 K/uL — ABNORMAL LOW (ref 0.7–4.0)
MCH: 24.9 pg — ABNORMAL LOW (ref 26.0–34.0)
MCHC: 31 g/dL (ref 30.0–36.0)
MCV: 80.4 fL (ref 80.0–100.0)
Monocytes Absolute: 0.3 K/uL (ref 0.1–1.0)
Monocytes Relative: 4 %
Neutro Abs: 6.7 K/uL (ref 1.7–7.7)
Neutrophils Relative %: 91 %
Platelets: 161 K/uL (ref 150–400)
RBC: 4.53 MIL/uL (ref 3.87–5.11)
RDW: 14.8 % (ref 11.5–15.5)
WBC: 7.3 K/uL (ref 4.0–10.5)
nRBC: 0 % (ref 0.0–0.2)

## 2024-02-17 LAB — WET PREP, GENITAL
Clue Cells Wet Prep HPF POC: NONE SEEN
Sperm: NONE SEEN
Trich, Wet Prep: NONE SEEN
WBC, Wet Prep HPF POC: <10 (ref <10)
Yeast Wet Prep HPF POC: NONE SEEN

## 2024-02-17 LAB — HIV ANTIBODY (ROUTINE TESTING W REFLEX): HIV Screen 4th Generation wRfx: NONREACTIVE

## 2024-02-17 LAB — HCG, SERUM, QUALITATIVE: Preg, Serum: NEGATIVE

## 2024-02-17 LAB — LIPASE, BLOOD: Lipase: 27 U/L (ref 11–51)

## 2024-02-17 MED ORDER — ONDANSETRON HCL 4 MG PO TABS: 4.0000 mg | ORAL_TABLET | Freq: Four times a day (QID) | ORAL | 0 refills | Status: AC

## 2024-02-17 MED ORDER — SODIUM CHLORIDE 0.9 % IV BOLUS
1000.0000 mL | Freq: Once | INTRAVENOUS | Status: AC
  Administered 2024-02-17: 1000 mL via INTRAVENOUS

## 2024-02-17 MED ORDER — IOHEXOL 300 MG/ML  SOLN
100.0000 mL | Freq: Once | INTRAMUSCULAR | Status: AC | PRN
  Administered 2024-02-17: 100 mL via INTRAVENOUS

## 2024-02-17 MED ORDER — ACETAMINOPHEN 500 MG PO TABS
1000.0000 mg | ORAL_TABLET | Freq: Once | ORAL | Status: AC
  Administered 2024-02-17: 1000 mg via ORAL
  Filled 2024-02-17: qty 2

## 2024-02-17 MED ORDER — ONDANSETRON HCL 4 MG/2ML IJ SOLN
4.0000 mg | Freq: Once | INTRAMUSCULAR | Status: AC
  Administered 2024-02-17: 4 mg via INTRAVENOUS
  Filled 2024-02-17: qty 2

## 2024-02-17 NOTE — ED Triage Notes (Signed)
 Pt with abd pain with N/V/D since 0500. Belching-tastes like rotten eggs per pt.

## 2024-02-17 NOTE — Discharge Instructions (Addendum)
 It was a pleasure taking care of you today.  Based on your history, physical exam, labs, as well as imaging I feel you are safe for discharge.  Today your lab workup and imaging was consistent with possible gastroenteritis.  Please continue to maintain good fluid intake, I have also sent in some nausea medication which you may take as needed for nausea.  Recommend over-the-counter Tylenol  and/or ibuprofen  for help with abdominal pain.  Please make your primary care provider aware of your workup and all findings today, and I do recommend that you establish with a primary care provider and OB/GYN as soon as possible in the area.  If you develop any the following symptoms including but not limited to fever, chills, worsening abdominal pain, signs of dehydration, blood in vomit, blood in diarrhea, or other concerning symptom please return to the emergency department or seek further medical care.  The results for your STD testing should go to your MyChart on your phone.  I have also attached some possible primary care providers in the [place] area which you can attempt to contact.  Recommend establish with a primary care provider soon as possible preferably within the next 1 to 2 weeks, if symptoms worsen recommend follow-up within 48 hours.  If diarrhea persists you may need a stool sample test.

## 2024-02-17 NOTE — ED Provider Notes (Signed)
 [place] EMERGENCY DEPARTMENT AT [facility] Provider Note   CSN: [id number] Arrival date & time: 02/17/24  1611     Patient presents with: Abdominal Pain   Tracey Young is a 38 y.o. female who presents to the emergency department with a chief complaint of right lower quadrant abdominal pain, nausea, vomiting, as well as diarrhea that started at 5 AM this morning.  Patient denies fever or chills, chest pain, shortness of breath.  Patient was recently treated for a urinary tract infection with Bactrim .  Patient's last dose was on 02/12/2024.  Patient states that she feels like her symptoms slightly improved after antibiotic therapy, but still does have this new abdominal tenderness.  Denies gross hematuria.  Patient does have a history of kidney stones previously.  Denies any diet changes, new medications other than the antibiotic, denies hematochezia.  Denies recent travel.  Past medical history significant for ventriculoperitoneal shunt.  Patient denies prescription medications at home.  Patient describes the pain as intermittent and whenever the episodes happen is very severe. Patient states that she has had more than 5 episodes of diarrhea today.  Patient does not have any abnormal vaginal discharge or STD like symptoms, or known contacts with patient's testing positive for STDs however would like testing today.    Abdominal Pain      Prior to Admission medications   Medication Sig Start Date End Date Taking? Authorizing Provider  ondansetron  (ZOFRAN ) 4 MG tablet Take 1 tablet (4 mg total) by mouth every 6 (six) hours. 02/17/24  Yes Mertice Uffelman F, PA-C    Allergies: Patient has no known allergies.    Review of Systems  Gastrointestinal:  Positive for abdominal pain.    Updated Vital Signs BP 116/75   Pulse 86   Temp 98.5 F (36.9 C) (Oral)   Resp 18   Ht 5' 6 (1.676 m)   Wt 61.2 kg   LMP 01/27/2024 (Exact Date)   SpO2 100%   BMI 21.79 kg/m   Physical  Exam Vitals and nursing note reviewed.  Constitutional:      General: She is awake. She is not in acute distress.    Appearance: Normal appearance. She is well-developed. She is not ill-appearing, toxic-appearing or diaphoretic.  HENT:     Head: Normocephalic and atraumatic.  Eyes:     General: No scleral icterus.    Conjunctiva/sclera: Conjunctivae normal.  Cardiovascular:     Rate and Rhythm: Normal rate and regular rhythm.  Pulmonary:     Effort: Pulmonary effort is normal. No respiratory distress.     Breath sounds: Normal breath sounds. No wheezing, rhonchi or rales.  Abdominal:     General: Abdomen is flat. There is no distension.     Palpations: Abdomen is soft.     Tenderness: There is abdominal tenderness in the right lower quadrant. There is no right CVA tenderness, left CVA tenderness, guarding or rebound.  Musculoskeletal:        General: Normal range of motion.     Right lower leg: No edema.     Left lower leg: No edema.     Comments: Normal range of motion of all 4 extremities, patient ambulatory without assistance  Skin:    General: Skin is warm.     Capillary Refill: Capillary refill takes less than 2 seconds.  Neurological:     General: No focal deficit present.     Mental Status: She is alert and oriented to person, place, and  time.  Psychiatric:        Mood and Affect: Mood normal.        Behavior: Behavior normal. Behavior is cooperative.     (all labs ordered are listed, but only abnormal results are displayed) Labs Reviewed  COMPREHENSIVE METABOLIC PANEL WITH GFR - Abnormal; Notable for the following components:      Result Value   CO2 19 (*)    AST 53 (*)    All other components within normal limits  CBC WITH DIFFERENTIAL/PLATELET - Abnormal; Notable for the following components:   Hemoglobin 11.3 (*)    MCH 24.9 (*)    Lymphs Abs 0.3 (*)    All other components within normal limits  URINALYSIS, ROUTINE W REFLEX MICROSCOPIC - Abnormal; Notable  for the following components:   Hgb urine dipstick MODERATE (*)    All other components within normal limits  WET PREP, GENITAL  LIPASE, BLOOD  HCG, SERUM, QUALITATIVE  HIV ANTIBODY (ROUTINE TESTING W REFLEX)  SYPHILIS: RPR W/REFLEX TO RPR TITER AND TREPONEMAL ANTIBODIES, TRADITIONAL SCREENING AND DIAGNOSIS ALGORITHM  POC URINE PREG, ED  GC/CHLAMYDIA PROBE AMP ([place]) NOT AT [facility]    EKG: None  Radiology: CT ABDOMEN PELVIS W CONTRAST Result Date: 02/17/2024 EXAM: CT ABDOMEN AND PELVIS WITH CONTRAST 02/17/2024 07:11:08 PM TECHNIQUE: CT of the abdomen and pelvis was performed with the administration of 100 mL of iohexol  (OMNIPAQUE ) 300 MG/ML solution. Multiplanar reformatted images are provided for review. Automated exposure control, iterative reconstruction, and/or weight-based adjustment of the mA/kV was utilized to reduce the radiation dose to as low as reasonably achievable. COMPARISON: None available. CLINICAL HISTORY: RLQ abdominal pain, nausea, vomiting, diarrhea. FINDINGS: LOWER CHEST: No acute abnormality. LIVER: Simple cysts are seen scattered throughout the liver. The liver is otherwise unremarkable. GALLBLADDER AND BILE DUCTS: Gallbladder is unremarkable. No biliary ductal dilatation. SPLEEN: No acute abnormality. PANCREAS: No acute abnormality. ADRENAL GLANDS: No acute abnormality. KIDNEYS, URETERS AND BLADDER: 4 mm nonobstructing calculus noted within the lower pole of the left kidney. No ureteral calculi. No hydronephrosis. No perinephric inflammatory stranding or fluid collections were identified. The bladder is unremarkable. GI AND BOWEL: The appendix is normal. The stomach, small bowel, and large bowel are otherwise unremarkable. There is no bowel obstruction. PERITONEUM AND RETROPERITONEUM: Small free fluid within the pelvis is nonspecific in the setting of shunting. No free air. VASCULATURE: Aorta is normal in caliber. 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 LYMPH NODES: Shotty mesenteric adenopathy is present, nonspecific, possibly reactive or inflammatory in nature as can be seen with gastroenteritis or mesenteric adenitis. No frankly pathologic adenopathy identified within the abdomen and pelvis. REPRODUCTIVE ORGANS: A 3.8 cm cystic lesion is seen within the left adnexa demonstrating a fluid-fluid level (coronal image 73/4) in keeping with a probable hemorrhagic cyst. BONES AND SOFT TISSUES: Right-sided ventriculoperitoneal shunt is seen with the catheter tubing terminating within the left hemipelvis. No acute osseous abnormality. No focal soft tissue abnormality. IMPRESSION: 1. Shotty mesenteric adenopathy is present, nonspecific, possibly reactive or inflammatory in nature as can be seen with gastroenteritis or mesenteric adenitis. 2. 4 mm nonobstructing calculus in the lower pole of the left kidney. 3. Probable 3.8 cm hemorrhagic cyst in the left adnexa. No follow-up imaging recommended. 4. 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 Correlation for clinical symptomatology may be helpful for further management. Electronically signed by:  Dorethia Molt MD 02/17/2024 07:26 PM EST RP Workstation: [id number]     Procedures   Medications Ordered in the ED  acetaminophen  (TYLENOL ) tablet 1,000 mg (1,000 mg Oral Given 02/17/24 1751)  sodium chloride  0.9 % bolus 1,000 mL (0 mLs Intravenous Stopped 02/17/24 1914)  ondansetron  (ZOFRAN ) injection 4 mg (4 mg Intravenous Given 02/17/24 1749)  iohexol  (OMNIPAQUE ) 300 MG/ML solution 100 mL (100 mLs Intravenous Contrast Given 02/17/24 1903)                                    Medical Decision Making Amount and/or Complexity of Data Reviewed Labs: ordered. Radiology: ordered.  Risk OTC drugs. Prescription drug management.   Patient presents to the ED for concern of nausea,  vomiting, diarrhea, abdominal pain, this involves an extensive number of treatment options, and is a complaint that carries with it a high risk of complications and morbidity.  The differential diagnosis includes gastroenteritis, C. difficile infection, viral syndrome, small bowel obstruction, appendicitis, cholecystitis, nephrolith lithiasis, diverticulitis, etc.   Co morbidities that complicate the patient evaluation  History of ventriculoperitoneal shunt   Additional history obtained:  Reviewed most recent urgent care note from 02/05/2024 where patient was treated with Bactrim  for acute cystitis, at this time it appears patient did have a small amount of blood in her urine and her urine was also cloudy, telephone encounter on 02/08/2024 it appears that patient urine culture did not grow anything   Lab Tests:  I Ordered, and personally interpreted labs.  The pertinent results include: CBC reassuring, known anemia which is slightly improved from previous hemoglobin today 11.3, CMP reassuring with slightly decreased bicarb, mildly elevated AST, lipase unremarkable, urinalysis shows moderate hemoglobin however no evidence of infection, pregnancy negative, HIV in process as well as RPR, gonorrhea and chlamydia also in process, patient unable to give stool sample for C. 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, patient educated about all of these findings I agree with the radiologist interpretation   Medicines ordered and prescription drug management:  I ordered medication including fluids, Tylenol ,  Zofran  for abdominal pain, nausea, vomiting, suspected dehydration Reevaluation of the patient after these medicines showed that the patient improved I have reviewed the patients home medicines and have made adjustments as needed   Test Considered:  Pelvic ultrasound: Deferred at this time as patient does not seem significantly uncomfortable after workup and medications here today, doubt ovarian torsion based off of story, patient also has nausea, vomiting, as well as diarrhea along with the abdominal pain, pain is intermittent and is somewhat relieved with diarrhea according to patient, CT scan shows no abnormal findings or cyst of the right ovary but does show hemorrhagic cyst of the left, patient has no left-sided abdominal pain Pelvic exam: This was offered to the patient however patient deferred at this time stating that she would prefer to self swab for STDs, I am okay with this plan based off of story as symptoms and history does not seem consistent with pelvic inflammatory disease, patient also has no known contacts for STDs   Critical Interventions:  None  Problem List / ED Course:  38 year old female, vital signs stable, presents to the emergency department with right lower quadrant abdominal pain, nausea, vomiting, as well as diarrhea, onset was 5 AM today, patient does still have gallbladder as well as appendix, history of kidney stones, recently treated for UTI with Bactrim  On physical exam patient overall well-appearing however does have right lower quadrant abdominal pain with palpation, abdomen is soft and nondistended, no CVA tenderness Plan for general abdominal pain workup and symptomatic treatment with pain medication, nausea medication, as well as fluids for rehydration I do believe a CT scan is warranted at this time to rule out the possibility of appendicitis with point tenderness to the right lower quadrant, also possible kidney stone based off intermittent pain however  both of these are possibly less likely with the diarrhea Lab work reassuring, no leukocytosis, hemoglobin 11.3 which looks improved compared to previous, urinalysis significant for moderate hemoglobin however not consistent with infection, lipase not elevated, CMP reassuring showing possible mild dehydration with decreased bicarb, mildly elevated AST at 53 however patient does not have specific right upper quadrant abdominal tenderness so I see no indication for right upper quadrant ultrasound at this time, total bili is also not elevated, patient denies alcohol use CT scan consistent with possible gastroenteritis, left-sided nephrolithiasis present also suggestion of possible pelvic congestion syndrome, patient educated on all of these findings Patient feeling improved At this time I feel patient is stable for discharge, will discharge with outpatient Zofran  with instructions to continue to stay well-hydrated and follow-up with primary care provider and make them aware of all findings today, patient is in the process of finding a new primary care provider as well as an OB/GYN Return precautions given Patient discharged, patient will watch her phone for STD results, patient was unable to give C. difficile sample while here in the emergency department, most likely diagnosis at this time is gastroenteritis, history, physical exam, as well as workup reassuring, strict return precautions given   Reevaluation:  After the interventions noted above, I reevaluated the patient and found that they have :improved   Social Determinants of Health:  Just moved, currently no PCP or OB/GYN   Dispostion:  After consideration of the diagnostic results and the patients response to treatment, I feel that the patent would benefit from discharge and outpatient therapies described, continued monitoring of symptoms, establish with PCP and OB/GYN as soon as possible.       Final diagnoses:  Nausea vomiting and  diarrhea  RLQ abdominal pain  Nephrolithiasis  Hemorrhagic cyst of left ovary    ED Discharge Orders          Ordered    ondansetron  (ZOFRAN ) 4 MG tablet  Every 6 hours        02/17/24 1946               Cherrish Vitali F, PA-C 02/17/24 2005    Melvenia Motto, MD 02/17/24 2244

## 2024-02-18 LAB — GC/CHLAMYDIA PROBE AMP (~~LOC~~) NOT AT ARMC
Chlamydia: NEGATIVE
Comment: NEGATIVE
Comment: NORMAL
Neisseria Gonorrhea: NEGATIVE

## 2024-02-18 LAB — SYPHILIS: RPR W/REFLEX TO RPR TITER AND TREPONEMAL ANTIBODIES, TRADITIONAL SCREENING AND DIAGNOSIS ALGORITHM: RPR Ser Ql: NONREACTIVE

## 2024-02-27 ENCOUNTER — Encounter: Primary: MD

## 2024-02-29 ENCOUNTER — Ambulatory Visit: Admit: 2024-02-29 | Discharge: 2024-02-29 | Primary: MD

## 2024-02-29 VITALS — BP 122/62 | HR 74 | Temp 97.80000°F | Ht 65.0 in | Wt 189.4 lb

## 2024-02-29 DIAGNOSIS — M25512 Pain in left shoulder: Principal | ICD-10-CM

## 2024-02-29 MED ORDER — TRIAMCINOLONE ACETONIDE 40 MG/ML SUSPENSION FOR INJECTION
40 | Freq: Once | INTRAMUSCULAR | Status: AC
Start: 2024-02-29 — End: 2024-02-29
  Administered 2024-02-29: 11:00:00 40 mg via INTRA_ARTICULAR

## 2024-02-29 MED ORDER — LIDOCAINE HCL 10 MG/ML (1 %) INJECTION SOLUTION
10 | Freq: Once | INTRAMUSCULAR | Status: AC
Start: 2024-02-29 — End: 2024-02-29
  Administered 2024-02-29: 11:00:00 10 mL via SUBCUTANEOUS

## 2024-02-29 NOTE — Progress Notes (Signed)
 Rachel [name] is a 38 y.o. female who presents today for   Chief Complaint   Patient presents with    Shoulder Pain     LT   .    Review of Systems:  Positive for:  None    Negative for:  All: Fever, Chills, Night Sweats, Memory Loss, Dizziness, and Numbness    MEDICATIONS ADMINISTERED-LAST 24 Hours  (last 24 hrs)           ** No medications to display **            BP: 122/62 / Pulse: 74 / SpO2: 96 % / Temp: 36.6 ??C (97.8 ??F)/ BMI 31.52 (class 1 obesity)     MEDICATIONS ADMINISTERED-LAST 24 Hours  (last 24 hrs)           OTHER         Medication Name Action Time Action Route Dose     lidocaine  10 mg/mL (1 %) injection solution 4 mL 02/29/24 1108 Given Subcutaneous 4 mL     triamcinolone  acetonide (KENALOG -40) injection 40 mg 02/29/24 1109 Given Intra-articular 40 mg                C. Pegah Segel, MA

## 2024-02-29 NOTE — Progress Notes (Signed)
 ORTHOPEDIC NOTE:    Rachel [name] is a 38 y.o. female with a chief complaint of:    Chief Complaint   Patient presents with    Shoulder Pain     LT       PCP:  ROCKY SKELTON, MD   REFERRING PROVIDER:  Rocky Skelton, MD     Clinical Note     38 year old female returns for left shoulder.    01/15/2024 with PA Beverley Coons:  left shoulder pain she has had for a couple of months with no inciting injury. Pain is global in nature with some radiation down the posterior aspect of her arm. She has tried treating this with acetaminophen , multiple anti-inflammatories, Lidoderm  patches and Flexeril . Reports no history of surgeries on the shoulder. 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 She is a Artist. Otherwise reasonably healthy.     02/29/2024  Returns today reporting very minimal if any improvement since last visit.  Continues to have pain in the shoulder.    Physical exam  Left upper extremity-tenderness at the bicep groove and greater tuberosity.  Pain with Neer and Hawkins.  Pain speeds O'Brien's.  Does have some tenderness at the [facility] joint as well.  Otherwise grossly neurovascularly intact.    Radiology  Plain radiographs 01/15/2024 reviewed as well as MRI 12/25/2023 and radiology report.  These demonstrate tendinosis and possible very low-grade partial tearing of the rotator cuff.  SLAP tear.  Some evidence of mild AC arthritis.    Assessment  1.  Left shoulder SLAP tear/bicep tendinitis  2.  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  Left shoulder impingement/bursitis  4.  Left shoulder AC arthritis X  Plan  Presentation consistent with above diagnoses.  I last visit with PA Beverley Coons was prescribed Medrol  Dosepak that was of some limited  benefit.  She is on other oral medications.  At this point time consideration for operative versus nonoperative intervention.  Major surgical intervention with shoulder arthroscopy, debridement, decompression, open bicep tenodesis considered but ultimately decision was for steroid injection.  She had significant anxiety about this but did elect to proceed with that.  See below for details.  Follow-up 3 months.  Questions answered.    MODIFIER 25:  At the conclusion of the evaluation/management portion of the encounter, I discussed with the patient returning at a separate visit for the injection(s) versus having the injection(s) performed today in the same setting.  Since the patient is already here and does not want to have to return, the injection(s) performed today in the same setting.  The patient underwent the injection(s) into the left shoulder via lateral approach with 4 mL of 1% lidocaine  and 1 mL of Kenalog  40 mg.  The patient tolerated that well.          Vitals     VITAL SIGNS:  BP 122/62 (BP Location: Right arm, Patient Position: Sitting)   Pulse 74   Temp 36.6 ??C (97.8 ??F) (Temporal)   Ht 5' 5 (1.651 m)   Wt 189 lb 6.4 oz (85.9 kg)   LMP 02/10/2022 (Exact Date)   SpO2 96%   BMI 31.52 kg/m??   BMI:  Body mass index is 31.52 kg/m??.      IMAGING / LABORATORY STUDIES     See  above for further details.    ASSESSMENT / PLAN     Luke was seen today for shoulder pain.    Diagnoses and all orders for this visit:    Left shoulder pain, unspecified chronicity  -     lidocaine  10 mg/mL (1 %) injection solution 4 mL  -     triamcinolone  acetonide (KENALOG -40) injection 40 mg    Superior glenoid labrum lesion of left shoulder, subsequent encounter        See above for further details    Leontine Desanctis, MD   02/29/2024, 11:19 AM

## 2024-03-10 MED ORDER — BUPROPION HCL XL 150 MG 24 HR TABLET, EXTENDED RELEASE
150 | ORAL_TABLET | Freq: Every morning | ORAL | 3 refills | 30.00000 days | Status: AC
Start: 2024-03-10 — End: 2024-06-08

## 2024-04-03 ENCOUNTER — Ambulatory Visit: Admit: 2024-04-03 | Discharge: 2024-04-03 | Primary: MD

## 2024-04-03 ENCOUNTER — Other Ambulatory Visit: Admit: 2024-04-03 | Primary: MD

## 2024-04-03 VITALS — BP 114/60 | HR 84 | Temp 97.40000°F | Resp 16 | Ht 65.0 in | Wt 185.2 lb

## 2024-04-03 DIAGNOSIS — J020 Streptococcal pharyngitis: Principal | ICD-10-CM

## 2024-04-03 LAB — XPRESS STREP A PCR (SLM): Group A Strep, PCR: NOT DETECTED

## 2024-04-03 MED ORDER — ONDANSETRON 4 MG DISINTEGRATING TABLET
4 | ORAL_TABLET | Freq: Three times a day (TID) | ORAL | 0 refills | Status: AC | PRN
Start: 2024-04-03 — End: 2024-04-06

## 2024-04-03 MED ORDER — AMOXICILLIN 875 MG-POTASSIUM CLAVULANATE 125 MG TABLET
875-125 | ORAL_TABLET | Freq: Two times a day (BID) | ORAL | 0 refills | Status: AC
Start: 2024-04-03 — End: 2024-04-13

## 2024-04-03 NOTE — Telephone Encounter (Signed)
 Attempted to contact pt regarding results per [facility]. Unable to leave VM, will try again later.

## 2024-04-03 NOTE — Progress Notes (Signed)
 Rachel [name] is a 39 y.o. female who presents today for   Chief Complaint   Patient presents with    Sore Throat     Whit spots    Duration: 2 days   Laterality: n/a   Fever/Chills?:yes   Injury related: no   Home remedies tried: mucinex, tylenol , ibuprofen         BP: 114/60/ Pulse: 84/ SpO2: 96 %/ Temp: 36.3 ??C (97.4 ??F)      MEDICATIONS ADMINISTERED-LAST 24 Hours  (last 24 hrs)           ** No medications to display **            Ambulatory Nursing Form
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
## 2024-04-04 NOTE — Telephone Encounter (Signed)
 Attempted to contact patient, left a voicemail with a request to call back. Will try again later.

## 2024-04-05 NOTE — Telephone Encounter (Signed)
 Attempted to contact patient, left a voicemail.    Will send letter in mail.
